# Patient Record
Sex: Male | Born: 1965 | Race: White | Hispanic: No | Smoking: Current some day smoker
Health system: Southern US, Community
[De-identification: ages and names within clinical notes are randomized; demographics above are authoritative.]

## PROBLEM LIST (undated history)

## (undated) DIAGNOSIS — T56891A Toxic effect of other metals, accidental (unintentional), initial encounter: Secondary | ICD-10-CM

## (undated) DIAGNOSIS — F329 Major depressive disorder, single episode, unspecified: Secondary | ICD-10-CM

## (undated) DIAGNOSIS — F319 Bipolar disorder, unspecified: Secondary | ICD-10-CM

## (undated) DIAGNOSIS — F32A Depression, unspecified: Secondary | ICD-10-CM

## (undated) DIAGNOSIS — K625 Hemorrhage of anus and rectum: Secondary | ICD-10-CM

## (undated) DIAGNOSIS — J449 Chronic obstructive pulmonary disease, unspecified: Secondary | ICD-10-CM

## (undated) DIAGNOSIS — G4733 Obstructive sleep apnea (adult) (pediatric): Secondary | ICD-10-CM

## (undated) DIAGNOSIS — I10 Essential (primary) hypertension: Secondary | ICD-10-CM

## (undated) DIAGNOSIS — N529 Male erectile dysfunction, unspecified: Secondary | ICD-10-CM

## (undated) DIAGNOSIS — E785 Hyperlipidemia, unspecified: Secondary | ICD-10-CM

## (undated) DIAGNOSIS — T50901A Poisoning by unspecified drugs, medicaments and biological substances, accidental (unintentional), initial encounter: Secondary | ICD-10-CM

## (undated) DIAGNOSIS — M722 Plantar fascial fibromatosis: Secondary | ICD-10-CM

## (undated) DIAGNOSIS — F1010 Alcohol abuse, uncomplicated: Secondary | ICD-10-CM

## (undated) DIAGNOSIS — R935 Abnormal findings on diagnostic imaging of other abdominal regions, including retroperitoneum: Secondary | ICD-10-CM

## (undated) DIAGNOSIS — K922 Gastrointestinal hemorrhage, unspecified: Secondary | ICD-10-CM

## (undated) DIAGNOSIS — Z9889 Other specified postprocedural states: Secondary | ICD-10-CM

## (undated) HISTORY — DX: Gastrointestinal hemorrhage, unspecified: K92.2

## (undated) HISTORY — DX: Plantar fascial fibromatosis: M72.2

## (undated) HISTORY — DX: Male erectile dysfunction, unspecified: N52.9

## (undated) HISTORY — DX: Hemorrhage of anus and rectum: K62.5

## (undated) HISTORY — DX: Alcohol abuse, uncomplicated: F10.10

## (undated) HISTORY — DX: Toxic effect of other metals, accidental (unintentional), initial encounter: T56.891A

## (undated) HISTORY — DX: Chronic obstructive pulmonary disease, unspecified: J44.9

## (undated) HISTORY — DX: Poisoning by unspecified drugs, medicaments and biological substances, accidental (unintentional), initial encounter: T50.901A

## (undated) HISTORY — DX: Essential (primary) hypertension: I10

## (undated) HISTORY — DX: Hyperlipidemia, unspecified: E78.5

## (undated) HISTORY — DX: Obstructive sleep apnea (adult) (pediatric): G47.33

## (undated) HISTORY — DX: Bipolar disorder, unspecified: F31.9

## (undated) HISTORY — DX: Other specified postprocedural states: Z98.890

## (undated) HISTORY — DX: Abnormal findings on diagnostic imaging of other abdominal regions, including retroperitoneum: R93.5

## (undated) HISTORY — DX: Depression, unspecified: F32.A

## (undated) HISTORY — DX: Major depressive disorder, single episode, unspecified: F32.9

---

## 2004-08-12 ENCOUNTER — Emergency Department: Payer: Self-pay | Admitting: General Practice

## 2004-09-01 ENCOUNTER — Encounter: Payer: Self-pay | Admitting: Unknown Physician Specialty

## 2004-10-17 ENCOUNTER — Ambulatory Visit: Payer: Self-pay | Admitting: Family Medicine

## 2004-10-19 ENCOUNTER — Ambulatory Visit: Payer: Self-pay | Admitting: Family Medicine

## 2004-10-20 ENCOUNTER — Ambulatory Visit: Payer: Self-pay

## 2004-10-20 HISTORY — PX: DOPPLER ECHOCARDIOGRAPHY: SHX263

## 2004-10-26 ENCOUNTER — Ambulatory Visit: Payer: Self-pay | Admitting: Family Medicine

## 2004-11-25 ENCOUNTER — Ambulatory Visit: Payer: Self-pay | Admitting: Family Medicine

## 2005-06-29 ENCOUNTER — Emergency Department: Payer: Self-pay | Admitting: Emergency Medicine

## 2005-11-16 ENCOUNTER — Ambulatory Visit: Payer: Self-pay | Admitting: Family Medicine

## 2005-12-18 ENCOUNTER — Ambulatory Visit: Payer: Self-pay | Admitting: Family Medicine

## 2005-12-27 ENCOUNTER — Ambulatory Visit (HOSPITAL_BASED_OUTPATIENT_CLINIC_OR_DEPARTMENT_OTHER): Admission: RE | Admit: 2005-12-27 | Discharge: 2005-12-27 | Payer: Self-pay | Admitting: Family Medicine

## 2006-01-09 ENCOUNTER — Ambulatory Visit: Payer: Self-pay | Admitting: Pulmonary Disease

## 2006-01-24 ENCOUNTER — Ambulatory Visit: Payer: Self-pay | Admitting: Family Medicine

## 2006-03-20 ENCOUNTER — Ambulatory Visit: Payer: Self-pay | Admitting: Family Medicine

## 2006-04-06 ENCOUNTER — Ambulatory Visit: Payer: Self-pay | Admitting: Family Medicine

## 2006-04-27 ENCOUNTER — Ambulatory Visit: Payer: Self-pay | Admitting: Gastroenterology

## 2006-05-07 ENCOUNTER — Ambulatory Visit: Payer: Self-pay | Admitting: Gastroenterology

## 2006-05-07 ENCOUNTER — Encounter (INDEPENDENT_AMBULATORY_CARE_PROVIDER_SITE_OTHER): Payer: Self-pay | Admitting: *Deleted

## 2006-06-15 ENCOUNTER — Ambulatory Visit: Payer: Self-pay | Admitting: Family Medicine

## 2006-08-29 ENCOUNTER — Ambulatory Visit (HOSPITAL_COMMUNITY): Payer: Self-pay | Admitting: Psychiatry

## 2006-09-18 ENCOUNTER — Ambulatory Visit: Payer: Self-pay | Admitting: Family Medicine

## 2006-09-18 LAB — CONVERTED CEMR LAB
AST: 40 units/L — ABNORMAL HIGH (ref 0–37)
Albumin: 4.1 g/dL (ref 3.5–5.2)
Basophils Absolute: 0.2 10*3/uL — ABNORMAL HIGH (ref 0.0–0.1)
Basophils Relative: 1.4 % — ABNORMAL HIGH (ref 0.0–1.0)
Eosinophils Relative: 3.7 % (ref 0.0–5.0)
GFR calc Af Amer: 85 mL/min
GFR calc non Af Amer: 71 mL/min
Glucose, Bld: 136 mg/dL — ABNORMAL HIGH (ref 70–99)
Lithium Lvl: 0.69 meq/L — ABNORMAL LOW (ref 0.80–1.40)
MCV: 93.5 fL (ref 78.0–100.0)
Monocytes Relative: 5.5 % (ref 3.0–11.0)
Neutro Abs: 8.5 10*3/uL — ABNORMAL HIGH (ref 1.4–7.7)
Platelets: 321 10*3/uL (ref 150–400)
RBC: 4.72 M/uL (ref 4.22–5.81)
RDW: 12.3 % (ref 11.5–14.6)
Sodium: 139 meq/L (ref 135–145)
TSH: 3.84 microintl units/mL (ref 0.35–5.50)

## 2006-09-19 ENCOUNTER — Ambulatory Visit (HOSPITAL_COMMUNITY): Payer: Self-pay | Admitting: Psychiatry

## 2006-10-03 ENCOUNTER — Ambulatory Visit: Payer: Self-pay | Admitting: Family Medicine

## 2006-10-17 ENCOUNTER — Ambulatory Visit (HOSPITAL_COMMUNITY): Payer: Self-pay | Admitting: Psychiatry

## 2006-11-05 ENCOUNTER — Ambulatory Visit: Payer: Self-pay | Admitting: Oncology

## 2006-11-15 LAB — CBC WITH DIFFERENTIAL (CANCER CENTER ONLY)
BASO#: 0.2 10*3/uL (ref 0.0–0.2)
BASO%: 1.2 % (ref 0.0–2.0)
EOS%: 3.5 % (ref 0.0–7.0)
HGB: 14.8 g/dL (ref 13.0–17.1)
LYMPH#: 2.9 10*3/uL (ref 0.9–3.3)
MCHC: 34.6 g/dL (ref 32.0–35.9)
NEUT#: 10.8 10*3/uL — ABNORMAL HIGH (ref 1.5–6.5)
Platelets: 270 10*3/uL (ref 145–400)

## 2006-11-15 LAB — MORPHOLOGY - CHCC SATELLITE

## 2006-11-21 LAB — LEUKOCYTE ALKALINE PHOS: Leukocyte Alkaline  Phos Stain: 93 (ref 22–124)

## 2006-11-21 LAB — COMPREHENSIVE METABOLIC PANEL
Albumin: 4.6 g/dL (ref 3.5–5.2)
Alkaline Phosphatase: 72 U/L (ref 39–117)
CO2: 24 mEq/L (ref 19–32)
Glucose, Bld: 114 mg/dL — ABNORMAL HIGH (ref 70–99)
Potassium: 3.7 mEq/L (ref 3.5–5.3)
Sodium: 138 mEq/L (ref 135–145)
Total Protein: 7.2 g/dL (ref 6.0–8.3)

## 2006-11-21 LAB — FERRITIN: Ferritin: 254 ng/mL (ref 22–322)

## 2006-11-21 LAB — IRON AND TIBC: Iron: 103 ug/dL (ref 42–165)

## 2006-11-21 LAB — LACTATE DEHYDROGENASE: LDH: 153 U/L (ref 94–250)

## 2006-12-03 ENCOUNTER — Ambulatory Visit (HOSPITAL_COMMUNITY): Payer: Self-pay | Admitting: Psychiatry

## 2006-12-04 ENCOUNTER — Telehealth (INDEPENDENT_AMBULATORY_CARE_PROVIDER_SITE_OTHER): Payer: Self-pay | Admitting: *Deleted

## 2006-12-04 ENCOUNTER — Ambulatory Visit: Payer: Self-pay | Admitting: Family Medicine

## 2006-12-04 DIAGNOSIS — M545 Low back pain: Secondary | ICD-10-CM

## 2006-12-12 ENCOUNTER — Ambulatory Visit: Payer: Self-pay | Admitting: Family Medicine

## 2006-12-19 LAB — CBC WITH DIFFERENTIAL (CANCER CENTER ONLY)
BASO#: 0.1 10*3/uL (ref 0.0–0.2)
Eosinophils Absolute: 0.4 10*3/uL (ref 0.0–0.5)
HGB: 13 g/dL (ref 13.0–17.1)
LYMPH#: 2.1 10*3/uL (ref 0.9–3.3)
MCH: 31.9 pg (ref 28.0–33.4)
MONO#: 0.8 10*3/uL (ref 0.1–0.9)
NEUT#: 9 10*3/uL — ABNORMAL HIGH (ref 1.5–6.5)
RBC: 4.07 10*6/uL — ABNORMAL LOW (ref 4.20–5.70)
WBC: 12.3 10*3/uL — ABNORMAL HIGH (ref 4.0–10.0)

## 2006-12-19 LAB — URIC ACID: Uric Acid, Serum: 11.4 mg/dL — ABNORMAL HIGH (ref 2.4–7.0)

## 2006-12-26 ENCOUNTER — Emergency Department: Payer: Self-pay | Admitting: General Practice

## 2006-12-27 ENCOUNTER — Ambulatory Visit: Payer: Self-pay | Admitting: Pulmonary Disease

## 2006-12-28 ENCOUNTER — Inpatient Hospital Stay (HOSPITAL_COMMUNITY): Admission: EM | Admit: 2006-12-28 | Discharge: 2007-01-01 | Payer: Self-pay | Admitting: Emergency Medicine

## 2007-01-01 ENCOUNTER — Ambulatory Visit: Payer: Self-pay | Admitting: Oncology

## 2007-01-01 ENCOUNTER — Ambulatory Visit: Payer: Self-pay | Admitting: Internal Medicine

## 2007-01-01 ENCOUNTER — Encounter: Payer: Self-pay | Admitting: Family Medicine

## 2007-01-09 ENCOUNTER — Ambulatory Visit: Payer: Self-pay | Admitting: Family Medicine

## 2007-01-14 ENCOUNTER — Ambulatory Visit: Payer: Self-pay | Admitting: Internal Medicine

## 2007-01-14 ENCOUNTER — Inpatient Hospital Stay (HOSPITAL_COMMUNITY): Admission: EM | Admit: 2007-01-14 | Discharge: 2007-01-20 | Payer: Self-pay | Admitting: Emergency Medicine

## 2007-01-16 DIAGNOSIS — R935 Abnormal findings on diagnostic imaging of other abdominal regions, including retroperitoneum: Secondary | ICD-10-CM

## 2007-01-16 DIAGNOSIS — Z9889 Other specified postprocedural states: Secondary | ICD-10-CM

## 2007-01-16 HISTORY — DX: Other specified postprocedural states: Z98.890

## 2007-01-16 HISTORY — DX: Abnormal findings on diagnostic imaging of other abdominal regions, including retroperitoneum: R93.5

## 2007-01-18 ENCOUNTER — Ambulatory Visit: Payer: Self-pay | Admitting: Internal Medicine

## 2007-01-24 ENCOUNTER — Ambulatory Visit (HOSPITAL_BASED_OUTPATIENT_CLINIC_OR_DEPARTMENT_OTHER): Admission: RE | Admit: 2007-01-24 | Discharge: 2007-01-24 | Payer: Self-pay | Admitting: Pulmonary Disease

## 2007-01-24 ENCOUNTER — Ambulatory Visit: Payer: Self-pay | Admitting: Pulmonary Disease

## 2007-01-29 ENCOUNTER — Inpatient Hospital Stay (HOSPITAL_COMMUNITY): Admission: RE | Admit: 2007-01-29 | Discharge: 2007-01-31 | Payer: Self-pay | Admitting: *Deleted

## 2007-01-29 ENCOUNTER — Ambulatory Visit: Payer: Self-pay | Admitting: *Deleted

## 2007-01-29 ENCOUNTER — Encounter (INDEPENDENT_AMBULATORY_CARE_PROVIDER_SITE_OTHER): Payer: Self-pay | Admitting: *Deleted

## 2007-01-29 ENCOUNTER — Emergency Department (HOSPITAL_COMMUNITY): Admission: EM | Admit: 2007-01-29 | Discharge: 2007-01-29 | Payer: Self-pay | Admitting: Emergency Medicine

## 2007-02-01 ENCOUNTER — Emergency Department: Payer: Self-pay | Admitting: Emergency Medicine

## 2007-02-02 ENCOUNTER — Emergency Department: Payer: Self-pay | Admitting: Internal Medicine

## 2007-02-03 ENCOUNTER — Emergency Department: Payer: Self-pay | Admitting: Emergency Medicine

## 2007-02-04 ENCOUNTER — Emergency Department: Payer: Self-pay | Admitting: Emergency Medicine

## 2007-02-05 ENCOUNTER — Telehealth: Payer: Self-pay | Admitting: Family Medicine

## 2007-02-06 ENCOUNTER — Ambulatory Visit: Payer: Self-pay | Admitting: Family Medicine

## 2007-02-08 ENCOUNTER — Ambulatory Visit: Payer: Self-pay | Admitting: Orthopaedic Surgery

## 2007-02-13 ENCOUNTER — Encounter: Payer: Self-pay | Admitting: Family Medicine

## 2007-02-13 DIAGNOSIS — I10 Essential (primary) hypertension: Secondary | ICD-10-CM

## 2007-02-13 DIAGNOSIS — D7289 Other specified disorders of white blood cells: Secondary | ICD-10-CM

## 2007-02-13 DIAGNOSIS — E669 Obesity, unspecified: Secondary | ICD-10-CM | POA: Insufficient documentation

## 2007-02-13 DIAGNOSIS — E119 Type 2 diabetes mellitus without complications: Secondary | ICD-10-CM

## 2007-02-14 ENCOUNTER — Encounter (INDEPENDENT_AMBULATORY_CARE_PROVIDER_SITE_OTHER): Payer: Self-pay | Admitting: *Deleted

## 2007-03-02 ENCOUNTER — Emergency Department: Payer: Self-pay

## 2007-03-06 ENCOUNTER — Ambulatory Visit (HOSPITAL_COMMUNITY): Payer: Self-pay | Admitting: Psychiatry

## 2007-04-08 ENCOUNTER — Ambulatory Visit (HOSPITAL_COMMUNITY): Payer: Self-pay | Admitting: Psychiatry

## 2007-04-15 ENCOUNTER — Encounter: Admission: RE | Admit: 2007-04-15 | Discharge: 2007-04-15 | Payer: Self-pay | Admitting: Specialist

## 2007-05-02 ENCOUNTER — Ambulatory Visit: Payer: Self-pay | Admitting: Pulmonary Disease

## 2007-05-09 ENCOUNTER — Encounter (INDEPENDENT_AMBULATORY_CARE_PROVIDER_SITE_OTHER): Payer: Self-pay | Admitting: *Deleted

## 2007-05-10 ENCOUNTER — Ambulatory Visit: Payer: Self-pay | Admitting: Family Medicine

## 2007-05-12 LAB — CONVERTED CEMR LAB
Alkaline Phosphatase: 100 units/L (ref 39–117)
Basophils Absolute: 0.1 10*3/uL (ref 0.0–0.1)
Basophils Relative: 0.7 % (ref 0.0–1.0)
CO2: 29 meq/L (ref 19–32)
Chloride: 103 meq/L (ref 96–112)
Direct LDL: 110.2 mg/dL
GFR calc Af Amer: 85 mL/min
GFR calc non Af Amer: 70 mL/min
Hemoglobin: 14.6 g/dL (ref 13.0–17.0)
Hgb A1c MFr Bld: 7.5 % — ABNORMAL HIGH (ref 4.6–6.0)
Lymphocytes Relative: 19 % (ref 12.0–46.0)
Microalb Creat Ratio: 360.3 mg/g — ABNORMAL HIGH (ref 0.0–30.0)
Microalb, Ur: 25.4 mg/dL — ABNORMAL HIGH (ref 0.0–1.9)
Monocytes Relative: 5.4 % (ref 3.0–11.0)
Neutro Abs: 7.7 10*3/uL (ref 1.4–7.7)
Neutrophils Relative %: 71.8 % (ref 43.0–77.0)
Platelets: 327 10*3/uL (ref 150–400)
RBC: 4.86 M/uL (ref 4.22–5.81)
TSH: 1.54 microintl units/mL (ref 0.35–5.50)
Total Protein: 7.6 g/dL (ref 6.0–8.3)
Triglycerides: 518 mg/dL (ref 0–149)
VLDL: 104 mg/dL — ABNORMAL HIGH (ref 0–40)

## 2007-05-13 ENCOUNTER — Ambulatory Visit: Payer: Self-pay | Admitting: Family Medicine

## 2007-05-13 DIAGNOSIS — G4733 Obstructive sleep apnea (adult) (pediatric): Secondary | ICD-10-CM | POA: Insufficient documentation

## 2007-05-28 ENCOUNTER — Inpatient Hospital Stay (HOSPITAL_COMMUNITY): Admission: RE | Admit: 2007-05-28 | Discharge: 2007-06-03 | Payer: Self-pay | Admitting: Specialist

## 2007-05-28 ENCOUNTER — Ambulatory Visit: Payer: Self-pay | Admitting: Internal Medicine

## 2007-05-28 HISTORY — PX: BACK SURGERY: SHX140

## 2007-05-29 ENCOUNTER — Ambulatory Visit: Payer: Self-pay | Admitting: Physical Medicine & Rehabilitation

## 2007-05-30 ENCOUNTER — Encounter: Payer: Self-pay | Admitting: Family Medicine

## 2007-06-03 ENCOUNTER — Encounter: Payer: Self-pay | Admitting: Family Medicine

## 2007-06-13 ENCOUNTER — Telehealth (INDEPENDENT_AMBULATORY_CARE_PROVIDER_SITE_OTHER): Payer: Self-pay | Admitting: *Deleted

## 2007-06-20 ENCOUNTER — Ambulatory Visit: Payer: Self-pay | Admitting: Family Medicine

## 2007-07-11 ENCOUNTER — Encounter (INDEPENDENT_AMBULATORY_CARE_PROVIDER_SITE_OTHER): Payer: Self-pay | Admitting: *Deleted

## 2007-07-17 ENCOUNTER — Ambulatory Visit (HOSPITAL_COMMUNITY): Payer: Self-pay | Admitting: Psychiatry

## 2007-08-05 ENCOUNTER — Ambulatory Visit: Payer: Self-pay | Admitting: Family Medicine

## 2007-08-05 LAB — CONVERTED CEMR LAB
CO2: 23 meq/L (ref 19–32)
Calcium: 10 mg/dL (ref 8.4–10.5)
GFR calc non Af Amer: 59 mL/min
Potassium: 4.1 meq/L (ref 3.5–5.1)
Sodium: 136 meq/L (ref 135–145)

## 2007-08-12 ENCOUNTER — Ambulatory Visit: Payer: Self-pay | Admitting: Family Medicine

## 2007-08-12 DIAGNOSIS — M2550 Pain in unspecified joint: Secondary | ICD-10-CM

## 2007-08-12 DIAGNOSIS — J069 Acute upper respiratory infection, unspecified: Secondary | ICD-10-CM | POA: Insufficient documentation

## 2007-09-16 ENCOUNTER — Ambulatory Visit (HOSPITAL_COMMUNITY): Payer: Self-pay | Admitting: Psychiatry

## 2007-09-19 ENCOUNTER — Ambulatory Visit: Payer: Self-pay | Admitting: Family Medicine

## 2007-09-19 LAB — CONVERTED CEMR LAB
Glucose, Bld: 113 mg/dL — ABNORMAL HIGH (ref 70–99)
Sodium: 136 meq/L (ref 135–145)

## 2007-09-23 ENCOUNTER — Ambulatory Visit: Payer: Self-pay | Admitting: Family Medicine

## 2007-11-25 ENCOUNTER — Ambulatory Visit (HOSPITAL_COMMUNITY): Payer: Self-pay | Admitting: Psychiatry

## 2007-11-28 ENCOUNTER — Telehealth: Payer: Self-pay | Admitting: Family Medicine

## 2007-11-29 ENCOUNTER — Ambulatory Visit: Payer: Self-pay | Admitting: Family Medicine

## 2007-12-02 ENCOUNTER — Telehealth: Payer: Self-pay | Admitting: Family Medicine

## 2007-12-18 ENCOUNTER — Ambulatory Visit (HOSPITAL_COMMUNITY): Payer: Self-pay | Admitting: Psychiatry

## 2007-12-19 ENCOUNTER — Encounter: Payer: Self-pay | Admitting: Family Medicine

## 2008-01-07 ENCOUNTER — Ambulatory Visit (HOSPITAL_COMMUNITY): Payer: Self-pay | Admitting: Marriage and Family Therapist

## 2008-01-17 ENCOUNTER — Ambulatory Visit (HOSPITAL_COMMUNITY): Payer: Self-pay | Admitting: Marriage and Family Therapist

## 2008-01-20 ENCOUNTER — Encounter: Payer: Self-pay | Admitting: Family Medicine

## 2008-01-24 ENCOUNTER — Ambulatory Visit (HOSPITAL_COMMUNITY): Payer: Self-pay | Admitting: Marriage and Family Therapist

## 2008-01-27 ENCOUNTER — Ambulatory Visit (HOSPITAL_COMMUNITY): Payer: Self-pay | Admitting: Psychiatry

## 2008-01-30 ENCOUNTER — Ambulatory Visit (HOSPITAL_COMMUNITY): Payer: Self-pay | Admitting: Marriage and Family Therapist

## 2008-02-07 ENCOUNTER — Ambulatory Visit (HOSPITAL_COMMUNITY): Payer: Self-pay | Admitting: Marriage and Family Therapist

## 2008-02-13 ENCOUNTER — Ambulatory Visit (HOSPITAL_COMMUNITY): Payer: Self-pay | Admitting: Marriage and Family Therapist

## 2008-02-20 ENCOUNTER — Ambulatory Visit (HOSPITAL_COMMUNITY): Payer: Self-pay | Admitting: Marriage and Family Therapist

## 2008-02-27 ENCOUNTER — Ambulatory Visit (HOSPITAL_COMMUNITY): Payer: Self-pay | Admitting: Marriage and Family Therapist

## 2008-03-13 ENCOUNTER — Ambulatory Visit (HOSPITAL_COMMUNITY): Payer: Self-pay | Admitting: Marriage and Family Therapist

## 2008-03-13 ENCOUNTER — Encounter: Payer: Self-pay | Admitting: Family Medicine

## 2008-03-17 ENCOUNTER — Ambulatory Visit: Payer: Self-pay | Admitting: Family Medicine

## 2008-03-23 ENCOUNTER — Ambulatory Visit (HOSPITAL_COMMUNITY): Payer: Self-pay | Admitting: Psychiatry

## 2008-03-24 ENCOUNTER — Telehealth: Payer: Self-pay | Admitting: Pulmonary Disease

## 2008-03-24 ENCOUNTER — Encounter: Payer: Self-pay | Admitting: Family Medicine

## 2008-04-01 ENCOUNTER — Ambulatory Visit (HOSPITAL_COMMUNITY): Payer: Self-pay | Admitting: Marriage and Family Therapist

## 2008-04-10 ENCOUNTER — Encounter: Payer: Self-pay | Admitting: Pulmonary Disease

## 2008-04-15 ENCOUNTER — Ambulatory Visit (HOSPITAL_COMMUNITY): Payer: Self-pay | Admitting: Marriage and Family Therapist

## 2008-04-21 ENCOUNTER — Encounter: Payer: Self-pay | Admitting: Family Medicine

## 2008-04-24 ENCOUNTER — Ambulatory Visit: Payer: Self-pay | Admitting: Family Medicine

## 2008-04-27 LAB — CONVERTED CEMR LAB
Albumin: 4 g/dL (ref 3.5–5.2)
Alkaline Phosphatase: 81 units/L (ref 39–117)
BUN: 20 mg/dL (ref 6–23)
Basophils Absolute: 0 10*3/uL (ref 0.0–0.1)
Bilirubin, Direct: 0.1 mg/dL (ref 0.0–0.3)
CO2: 24 meq/L (ref 19–32)
Calcium: 9.8 mg/dL (ref 8.4–10.5)
Chloride: 106 meq/L (ref 96–112)
Eosinophils Absolute: 0.3 10*3/uL (ref 0.0–0.7)
Eosinophils Relative: 2.6 % (ref 0.0–5.0)
GFR calc Af Amer: 85 mL/min
HCT: 37.2 % — ABNORMAL LOW (ref 39.0–52.0)
Hemoglobin: 13.2 g/dL (ref 13.0–17.0)
MCHC: 35.6 g/dL (ref 30.0–36.0)
MCV: 92.7 fL (ref 78.0–100.0)
Neutro Abs: 9.6 10*3/uL — ABNORMAL HIGH (ref 1.4–7.7)
Neutrophils Relative %: 75.2 % (ref 43.0–77.0)
RDW: 12.9 % (ref 11.5–14.6)
Total Bilirubin: 0.7 mg/dL (ref 0.3–1.2)
Total Protein: 7.5 g/dL (ref 6.0–8.3)
WBC: 12.7 10*3/uL — ABNORMAL HIGH (ref 4.5–10.5)

## 2008-04-28 ENCOUNTER — Ambulatory Visit (HOSPITAL_COMMUNITY): Payer: Self-pay | Admitting: Marriage and Family Therapist

## 2008-05-06 ENCOUNTER — Ambulatory Visit (HOSPITAL_COMMUNITY): Payer: Self-pay | Admitting: Marriage and Family Therapist

## 2008-05-13 ENCOUNTER — Ambulatory Visit (HOSPITAL_COMMUNITY): Payer: Self-pay | Admitting: Marriage and Family Therapist

## 2008-05-18 ENCOUNTER — Ambulatory Visit (HOSPITAL_COMMUNITY): Payer: Self-pay | Admitting: Marriage and Family Therapist

## 2008-05-22 ENCOUNTER — Ambulatory Visit (HOSPITAL_COMMUNITY): Payer: Self-pay | Admitting: Psychiatry

## 2008-05-26 ENCOUNTER — Ambulatory Visit: Payer: Self-pay | Admitting: Family Medicine

## 2008-06-01 ENCOUNTER — Ambulatory Visit (HOSPITAL_COMMUNITY): Payer: Self-pay | Admitting: Marriage and Family Therapist

## 2008-06-08 ENCOUNTER — Ambulatory Visit (HOSPITAL_COMMUNITY): Payer: Self-pay | Admitting: Marriage and Family Therapist

## 2008-06-08 ENCOUNTER — Ambulatory Visit: Payer: Self-pay | Admitting: Family Medicine

## 2008-06-09 LAB — CONVERTED CEMR LAB
ALT: 97 units/L — ABNORMAL HIGH (ref 0–53)
Albumin: 4.1 g/dL (ref 3.5–5.2)
Alkaline Phosphatase: 73 units/L (ref 39–117)
BUN: 27 mg/dL — ABNORMAL HIGH (ref 6–23)
Calcium: 9.3 mg/dL (ref 8.4–10.5)
Chloride: 103 meq/L (ref 96–112)
Creatinine, Ser: 1.4 mg/dL (ref 0.4–1.5)
Eosinophils Absolute: 0.4 10*3/uL (ref 0.0–0.7)
Eosinophils Relative: 3.8 % (ref 0.0–5.0)
GFR calc Af Amer: 71 mL/min
Glucose, Bld: 167 mg/dL — ABNORMAL HIGH (ref 70–99)
HDL: 34.7 mg/dL — ABNORMAL LOW (ref >39.0)
Microalb Creat Ratio: 71.7 mg/g — ABNORMAL HIGH (ref 0.0–30.0)
Monocytes Absolute: 0.3 10*3/uL (ref 0.1–1.0)
Platelets: 240 10*3/uL (ref 150–400)
RBC: 4.08 M/uL — ABNORMAL LOW (ref 4.22–5.81)
RDW: 13.2 % (ref 11.5–14.6)
Sodium: 137 meq/L (ref 135–145)
TSH: 2.89 microintl units/mL (ref 0.35–5.50)
Total Bilirubin: 0.6 mg/dL (ref 0.3–1.2)
Total CHOL/HDL Ratio: 4.6
Triglycerides: 517 mg/dL (ref 0–149)
VLDL: 103 mg/dL — ABNORMAL HIGH (ref 0–40)
WBC: 10.3 10*3/uL (ref 4.5–10.5)

## 2008-06-10 IMAGING — NM NM GI BLOOD LOSS
2 series · 12 of 12 positions shown · non-contrast
Comparison: None.

CLINICAL DATA: Lower GI bleed.
 NUCLEAR MEDICINE GASTROINTESTINAL BLEEDING SCAN:
TECHNIQUE: Sequential abdominal images [DATE] hours were obtained following intravenous administration of Fc-99m labeled red blood cells.
 Radiopharmaceutical:  25.2 mCi Fc-99m in-vitro labeled red cells.

[Series 1: gi gi bleed · 4.75mm/px · 6 of 60 frames shown (1 of 2)]
[frame 6/60]
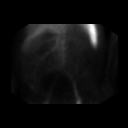
[frame 16/60]
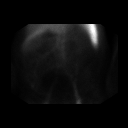
[frame 26/60]
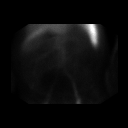
[frame 36/60]
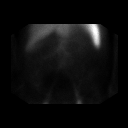
[frame 46/60]
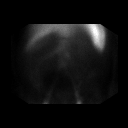
[frame 56/60]
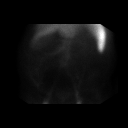

[Series 1: gi gi bleed · 4.75mm/px · 6 of 60 frames shown (2 of 2)]
[frame 6/60]
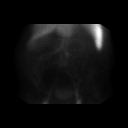
[frame 16/60]
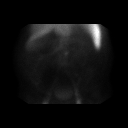
[frame 26/60]
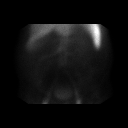
[frame 36/60]
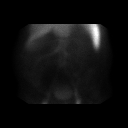
[frame 46/60]
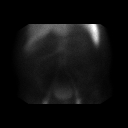
[frame 56/60]
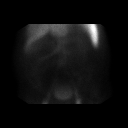

[12 of 12 positions shown; findings below may reference images not displayed]

FINDINGS: There is no definite extravasation of isotope into the large or small bowel.  However, there is a prominent vascular structure extending from the midline down to the right lower quadrant.  It is present throughout the entire study.  Its significance is not known with certainty, but it may represent a prominent ileocecal artery or mesenteric vein.  This raises the question as to whether the patient might have an AVM or telangiectasias in the cecum, that could possibly be the cause of a GI bleed.  As mentioned, at this time there is no visible extravasation into the GI tract.
IMPRESSION: 1.  No definite extravasation into the GI tract. 
 2.  Abnormal blood vessel emanating from the center of the epigastrium to the right lower quadrant ? question prominent ileocecal artery or draining vein.  Question AVM or telangiectasias in the cecum.  See report.

## 2008-06-15 ENCOUNTER — Ambulatory Visit (HOSPITAL_COMMUNITY): Payer: Self-pay | Admitting: Marriage and Family Therapist

## 2008-06-15 ENCOUNTER — Encounter: Payer: Self-pay | Admitting: Family Medicine

## 2008-06-16 ENCOUNTER — Ambulatory Visit: Payer: Self-pay | Admitting: Family Medicine

## 2008-06-22 ENCOUNTER — Ambulatory Visit (HOSPITAL_COMMUNITY): Payer: Self-pay | Admitting: Marriage and Family Therapist

## 2008-08-06 ENCOUNTER — Telehealth: Payer: Self-pay | Admitting: Pulmonary Disease

## 2008-08-11 ENCOUNTER — Ambulatory Visit (HOSPITAL_COMMUNITY): Payer: Self-pay | Admitting: Marriage and Family Therapist

## 2008-08-19 ENCOUNTER — Ambulatory Visit (HOSPITAL_COMMUNITY): Payer: Self-pay | Admitting: Marriage and Family Therapist

## 2008-08-24 ENCOUNTER — Ambulatory Visit (HOSPITAL_COMMUNITY): Payer: Self-pay | Admitting: Psychiatry

## 2008-08-26 ENCOUNTER — Ambulatory Visit: Payer: Self-pay | Admitting: Critical Care Medicine

## 2008-08-31 ENCOUNTER — Telehealth (INDEPENDENT_AMBULATORY_CARE_PROVIDER_SITE_OTHER): Payer: Self-pay | Admitting: *Deleted

## 2008-09-01 ENCOUNTER — Telehealth (INDEPENDENT_AMBULATORY_CARE_PROVIDER_SITE_OTHER): Payer: Self-pay | Admitting: *Deleted

## 2008-09-03 ENCOUNTER — Ambulatory Visit: Payer: Self-pay | Admitting: Family Medicine

## 2008-09-03 LAB — CONVERTED CEMR LAB
AST: 84 units/L — ABNORMAL HIGH (ref 0–37)
Alkaline Phosphatase: 90 units/L (ref 39–117)
BUN: 15 mg/dL (ref 6–23)
Bilirubin, Direct: 0.1 mg/dL (ref 0.0–0.3)
Calcium: 10 mg/dL (ref 8.4–10.5)
Creatinine, Ser: 1.4 mg/dL (ref 0.4–1.5)
GFR calc Af Amer: 71 mL/min
Glucose, Bld: 151 mg/dL — ABNORMAL HIGH (ref 70–99)
Sodium: 136 meq/L (ref 135–145)
Total Bilirubin: 0.6 mg/dL (ref 0.3–1.2)
Total Protein: 7.4 g/dL (ref 6.0–8.3)

## 2008-09-08 ENCOUNTER — Ambulatory Visit (HOSPITAL_COMMUNITY): Payer: Self-pay | Admitting: Marriage and Family Therapist

## 2008-09-10 ENCOUNTER — Encounter: Payer: Self-pay | Admitting: Critical Care Medicine

## 2008-09-14 ENCOUNTER — Ambulatory Visit (HOSPITAL_COMMUNITY): Payer: Self-pay | Admitting: Psychiatry

## 2008-09-22 ENCOUNTER — Ambulatory Visit (HOSPITAL_COMMUNITY): Payer: Self-pay | Admitting: Marriage and Family Therapist

## 2008-10-06 ENCOUNTER — Encounter: Payer: Self-pay | Admitting: Critical Care Medicine

## 2008-10-06 ENCOUNTER — Ambulatory Visit (HOSPITAL_COMMUNITY): Payer: Self-pay | Admitting: Marriage and Family Therapist

## 2008-10-12 ENCOUNTER — Ambulatory Visit (HOSPITAL_COMMUNITY): Payer: Self-pay | Admitting: Psychiatry

## 2008-10-19 ENCOUNTER — Ambulatory Visit (HOSPITAL_COMMUNITY): Payer: Self-pay | Admitting: Marriage and Family Therapist

## 2008-10-20 IMAGING — RF DG LUMBAR SPINE 2-3V
1 series · 3 of 3 positions shown · non-contrast
Comparison: CT myelogram dated 04/15/2007.

CLINICAL DATA: L4 through S1 fusion for degenerative disc disease and
spondylolisthesis.

LUMBAR SPINE - 5 C-ARM VIEW

[Series 0: selected · 3 of 3 slices shown]
[im 1/3]
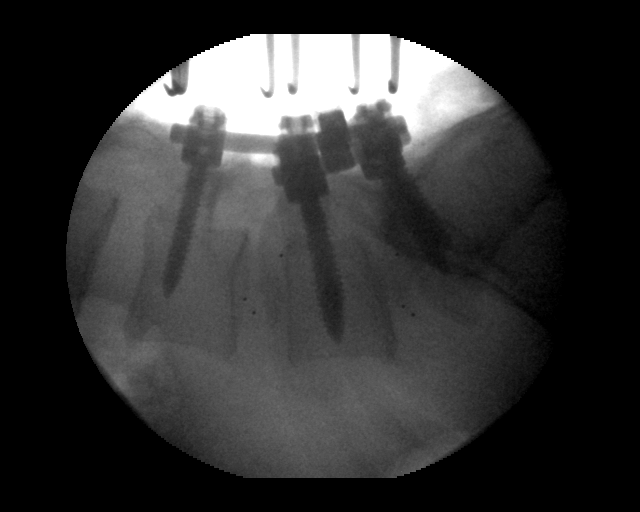
[im 2/3]
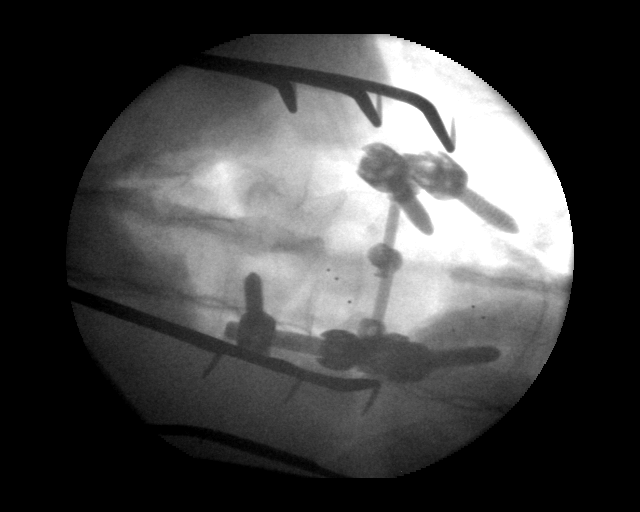
[im 3/3]
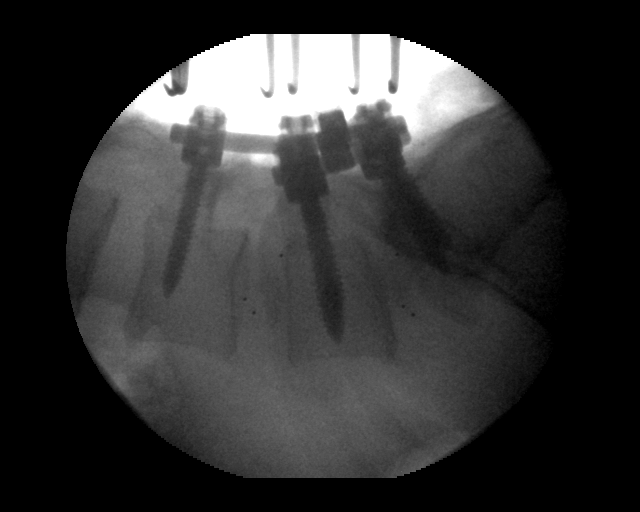

[3 of 3 positions shown; findings below may reference images not displayed]

FINDINGS: Operative images demonstrating interbody bone plug and pedicle screw
and rod placement at the L4 through S1 levels. Normal alignment.

IMPRESSION

Fusion at the L4 through S1 levels with normal alignment.

## 2008-10-20 IMAGING — CR DG CHEST 1V PORT
1 series · 1 of 1 positions shown · non-contrast
Comparison: 05/02/2007

CLINICAL DATA: Status post lumbar spine surgery

CHEST - 1 VIEW:

[view not recorded]
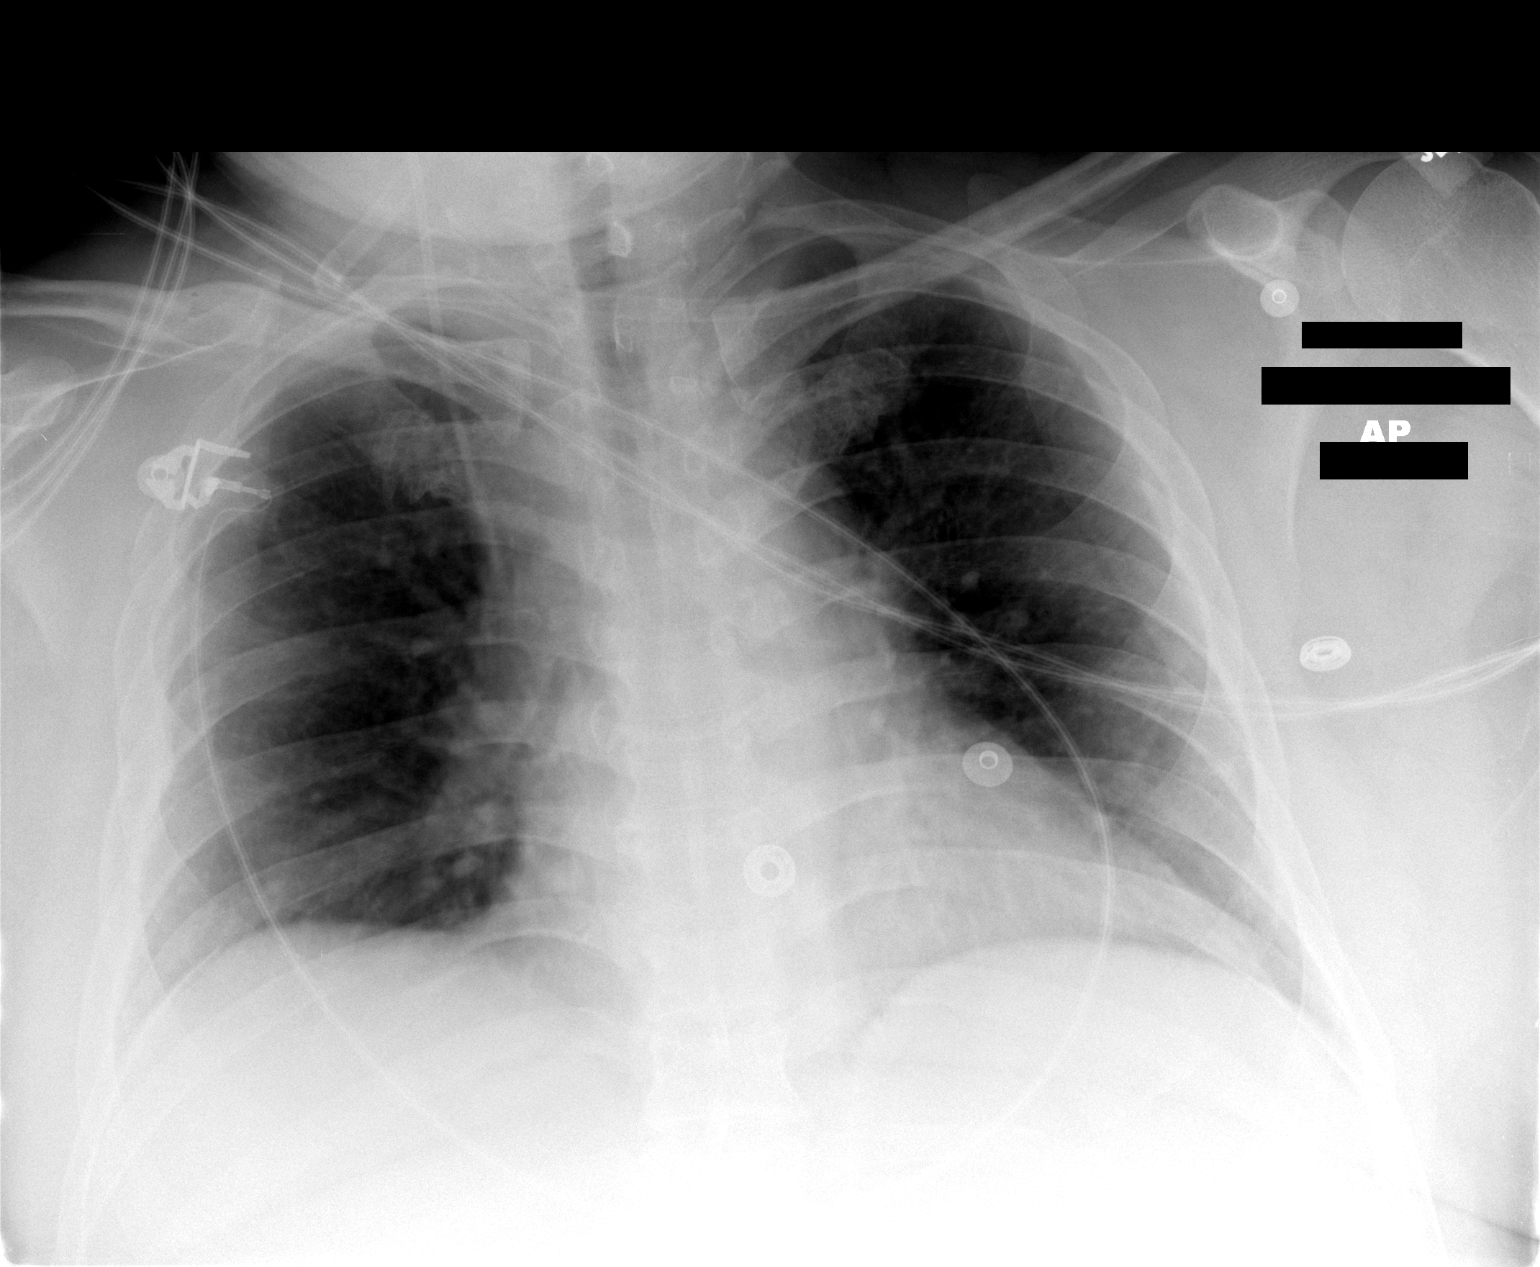

[1 of 1 positions shown; findings below may reference images not displayed]

FINDINGS: Right IJ catheter tip is in the SVC.

The heart size is enlarged. No pulmonary edema or pleural effusions.

The mediastinal contours are within normal limits.

Both lungs are clear.  

The visualized skeletal structures are unremarkable.
IMPRESSION: 1. Cardiac enlargement without heart failure.
2. Tip of IJ catheter in SVC.

## 2008-11-16 ENCOUNTER — Ambulatory Visit (HOSPITAL_COMMUNITY): Payer: Self-pay | Admitting: Marriage and Family Therapist

## 2008-12-07 ENCOUNTER — Ambulatory Visit (HOSPITAL_COMMUNITY): Payer: Self-pay | Admitting: Marriage and Family Therapist

## 2008-12-11 ENCOUNTER — Ambulatory Visit (HOSPITAL_COMMUNITY): Payer: Self-pay | Admitting: Psychiatry

## 2008-12-29 ENCOUNTER — Ambulatory Visit (HOSPITAL_COMMUNITY): Payer: Self-pay | Admitting: Marriage and Family Therapist

## 2009-02-10 ENCOUNTER — Ambulatory Visit (HOSPITAL_COMMUNITY): Payer: Self-pay | Admitting: Psychiatry

## 2009-02-15 ENCOUNTER — Ambulatory Visit (HOSPITAL_COMMUNITY): Payer: Self-pay | Admitting: Marriage and Family Therapist

## 2009-03-10 ENCOUNTER — Ambulatory Visit: Payer: Self-pay | Admitting: Family Medicine

## 2009-03-22 ENCOUNTER — Ambulatory Visit: Payer: Self-pay | Admitting: Family Medicine

## 2009-03-24 ENCOUNTER — Ambulatory Visit: Payer: Self-pay | Admitting: Family Medicine

## 2009-03-24 DIAGNOSIS — R740 Nonspecific elevation of levels of transaminase and lactic acid dehydrogenase [LDH]: Secondary | ICD-10-CM

## 2009-03-24 DIAGNOSIS — R7402 Elevation of levels of lactic acid dehydrogenase (LDH): Secondary | ICD-10-CM | POA: Insufficient documentation

## 2009-03-31 ENCOUNTER — Ambulatory Visit (HOSPITAL_COMMUNITY): Payer: Self-pay | Admitting: Licensed Clinical Social Worker

## 2009-04-14 ENCOUNTER — Ambulatory Visit (HOSPITAL_COMMUNITY): Payer: Self-pay | Admitting: Psychiatry

## 2009-04-19 ENCOUNTER — Ambulatory Visit (HOSPITAL_COMMUNITY): Payer: Self-pay | Admitting: Marriage and Family Therapist

## 2009-04-21 ENCOUNTER — Ambulatory Visit: Payer: Self-pay | Admitting: Psychiatry

## 2009-04-21 ENCOUNTER — Inpatient Hospital Stay (HOSPITAL_COMMUNITY): Admission: AD | Admit: 2009-04-21 | Discharge: 2009-04-22 | Payer: Self-pay | Admitting: Psychiatry

## 2009-05-05 ENCOUNTER — Ambulatory Visit (HOSPITAL_COMMUNITY): Payer: Self-pay | Admitting: Marriage and Family Therapist

## 2009-05-11 ENCOUNTER — Ambulatory Visit (HOSPITAL_COMMUNITY): Payer: Self-pay | Admitting: Marriage and Family Therapist

## 2009-05-12 ENCOUNTER — Ambulatory Visit (HOSPITAL_COMMUNITY): Payer: Self-pay | Admitting: Psychiatry

## 2009-05-26 ENCOUNTER — Ambulatory Visit (HOSPITAL_COMMUNITY): Payer: Self-pay | Admitting: Marriage and Family Therapist

## 2009-06-01 ENCOUNTER — Ambulatory Visit (HOSPITAL_COMMUNITY): Payer: Self-pay | Admitting: Marriage and Family Therapist

## 2009-06-03 ENCOUNTER — Telehealth: Payer: Self-pay | Admitting: Family Medicine

## 2009-06-08 ENCOUNTER — Ambulatory Visit (HOSPITAL_COMMUNITY): Payer: Self-pay | Admitting: Marriage and Family Therapist

## 2009-06-11 ENCOUNTER — Ambulatory Visit (HOSPITAL_COMMUNITY): Payer: Self-pay | Admitting: Psychiatry

## 2009-06-21 ENCOUNTER — Telehealth: Payer: Self-pay | Admitting: Family Medicine

## 2009-06-21 ENCOUNTER — Ambulatory Visit: Payer: Self-pay | Admitting: Family Medicine

## 2009-06-21 LAB — CONVERTED CEMR LAB: Hgb A1c MFr Bld: 12.5 % — ABNORMAL HIGH (ref 4.6–6.5)

## 2009-06-28 ENCOUNTER — Ambulatory Visit: Payer: Self-pay | Admitting: Family Medicine

## 2009-07-01 ENCOUNTER — Ambulatory Visit (HOSPITAL_COMMUNITY): Payer: Self-pay | Admitting: Marriage and Family Therapist

## 2009-07-06 ENCOUNTER — Ambulatory Visit (HOSPITAL_COMMUNITY): Payer: Self-pay | Admitting: Marriage and Family Therapist

## 2009-07-13 ENCOUNTER — Ambulatory Visit (HOSPITAL_COMMUNITY): Payer: Self-pay | Admitting: Marriage and Family Therapist

## 2009-07-16 ENCOUNTER — Ambulatory Visit (HOSPITAL_COMMUNITY): Payer: Self-pay | Admitting: Psychiatry

## 2009-07-27 ENCOUNTER — Telehealth: Payer: Self-pay | Admitting: Family Medicine

## 2009-08-19 ENCOUNTER — Ambulatory Visit (HOSPITAL_COMMUNITY): Payer: Self-pay | Admitting: Marriage and Family Therapist

## 2009-08-20 ENCOUNTER — Telehealth: Payer: Self-pay | Admitting: Family Medicine

## 2009-08-26 ENCOUNTER — Ambulatory Visit: Payer: Self-pay | Admitting: Family Medicine

## 2009-09-09 ENCOUNTER — Ambulatory Visit (HOSPITAL_COMMUNITY): Payer: Self-pay | Admitting: Marriage and Family Therapist

## 2009-09-10 ENCOUNTER — Ambulatory Visit (HOSPITAL_COMMUNITY): Payer: Self-pay | Admitting: Psychiatry

## 2009-09-27 ENCOUNTER — Ambulatory Visit: Payer: Self-pay | Admitting: Family Medicine

## 2009-09-28 LAB — CONVERTED CEMR LAB: Vit D, 25-Hydroxy: 17 ng/mL — ABNORMAL LOW (ref 30–89)

## 2009-10-11 ENCOUNTER — Ambulatory Visit (HOSPITAL_COMMUNITY): Payer: Self-pay | Admitting: Marriage and Family Therapist

## 2009-10-20 ENCOUNTER — Ambulatory Visit (HOSPITAL_COMMUNITY): Payer: Self-pay | Admitting: Psychiatry

## 2009-10-26 ENCOUNTER — Ambulatory Visit (HOSPITAL_COMMUNITY): Payer: Self-pay | Admitting: Marriage and Family Therapist

## 2009-11-17 ENCOUNTER — Ambulatory Visit (HOSPITAL_COMMUNITY): Payer: Self-pay | Admitting: Marriage and Family Therapist

## 2009-12-20 ENCOUNTER — Ambulatory Visit (HOSPITAL_COMMUNITY): Payer: Self-pay | Admitting: Psychiatry

## 2009-12-24 ENCOUNTER — Telehealth: Payer: Self-pay | Admitting: Family Medicine

## 2010-03-07 ENCOUNTER — Ambulatory Visit (HOSPITAL_COMMUNITY): Payer: Self-pay | Admitting: Psychiatry

## 2010-03-08 ENCOUNTER — Encounter (INDEPENDENT_AMBULATORY_CARE_PROVIDER_SITE_OTHER): Payer: Self-pay | Admitting: *Deleted

## 2010-03-22 ENCOUNTER — Ambulatory Visit (HOSPITAL_COMMUNITY): Payer: Self-pay | Admitting: Marriage and Family Therapist

## 2010-04-05 ENCOUNTER — Ambulatory Visit (HOSPITAL_COMMUNITY): Payer: Self-pay | Admitting: Marriage and Family Therapist

## 2010-04-20 ENCOUNTER — Ambulatory Visit: Payer: Self-pay | Admitting: Family Medicine

## 2010-04-20 LAB — CONVERTED CEMR LAB
ALT: 37 units/L (ref 0–53)
Albumin: 4.4 g/dL (ref 3.5–5.2)
Alkaline Phosphatase: 88 units/L (ref 39–117)
BUN: 13 mg/dL (ref 6–23)
Bilirubin, Direct: 0 mg/dL (ref 0.0–0.3)
Cholesterol: 264 mg/dL — ABNORMAL HIGH (ref 0–200)
Creatinine,U: 18 mg/dL
Direct LDL: 91.9 mg/dL
Glucose, Bld: 177 mg/dL — ABNORMAL HIGH (ref 70–99)
Hgb A1c MFr Bld: 9.3 % — ABNORMAL HIGH (ref 4.6–6.5)
Microalb Creat Ratio: 33.4 mg/g — ABNORMAL HIGH (ref 0.0–30.0)
Microalb, Ur: 6 mg/dL — ABNORMAL HIGH (ref 0.0–1.9)
Potassium: 3.8 meq/L (ref 3.5–5.1)
Triglycerides: 1005 mg/dL — ABNORMAL HIGH (ref 0.0–149.0)

## 2010-05-16 ENCOUNTER — Ambulatory Visit (HOSPITAL_COMMUNITY): Payer: Self-pay | Admitting: Marriage and Family Therapist

## 2010-05-18 ENCOUNTER — Ambulatory Visit (HOSPITAL_COMMUNITY): Payer: Self-pay | Admitting: Psychiatry

## 2010-05-19 ENCOUNTER — Telehealth: Payer: Self-pay | Admitting: Family Medicine

## 2010-05-26 ENCOUNTER — Ambulatory Visit: Payer: Self-pay | Admitting: Family Medicine

## 2010-05-31 ENCOUNTER — Ambulatory Visit (HOSPITAL_COMMUNITY): Payer: Self-pay | Admitting: Marriage and Family Therapist

## 2010-06-14 ENCOUNTER — Ambulatory Visit (HOSPITAL_COMMUNITY): Payer: Self-pay | Admitting: Marriage and Family Therapist

## 2010-06-16 ENCOUNTER — Telehealth: Payer: Self-pay | Admitting: Family Medicine

## 2010-06-28 ENCOUNTER — Ambulatory Visit (HOSPITAL_COMMUNITY): Payer: Self-pay | Admitting: Marriage and Family Therapist

## 2010-06-29 ENCOUNTER — Telehealth: Payer: Self-pay | Admitting: Family Medicine

## 2010-07-08 ENCOUNTER — Ambulatory Visit (HOSPITAL_COMMUNITY): Payer: Self-pay | Admitting: Psychiatry

## 2010-07-14 ENCOUNTER — Ambulatory Visit (HOSPITAL_COMMUNITY): Payer: Self-pay | Admitting: Marriage and Family Therapist

## 2010-07-26 ENCOUNTER — Ambulatory Visit (HOSPITAL_COMMUNITY)
Admission: RE | Admit: 2010-07-26 | Discharge: 2010-07-26 | Payer: Self-pay | Source: Home / Self Care | Attending: Marriage and Family Therapist | Admitting: Marriage and Family Therapist

## 2010-08-03 ENCOUNTER — Ambulatory Visit (HOSPITAL_COMMUNITY)
Admission: RE | Admit: 2010-08-03 | Discharge: 2010-08-03 | Payer: Self-pay | Source: Home / Self Care | Attending: Marriage and Family Therapist | Admitting: Marriage and Family Therapist

## 2010-08-10 ENCOUNTER — Ambulatory Visit: Admit: 2010-08-10 | Payer: Self-pay | Admitting: Family Medicine

## 2010-08-11 ENCOUNTER — Encounter (INDEPENDENT_AMBULATORY_CARE_PROVIDER_SITE_OTHER): Payer: Self-pay | Admitting: *Deleted

## 2010-08-12 ENCOUNTER — Telehealth: Payer: Self-pay | Admitting: Family Medicine

## 2010-08-15 ENCOUNTER — Ambulatory Visit
Admission: RE | Admit: 2010-08-15 | Discharge: 2010-08-15 | Payer: Self-pay | Source: Home / Self Care | Attending: Family Medicine | Admitting: Family Medicine

## 2010-08-15 ENCOUNTER — Other Ambulatory Visit: Payer: Self-pay | Admitting: Family Medicine

## 2010-08-15 LAB — LIPID PANEL
Cholesterol: 282 mg/dL — ABNORMAL HIGH (ref 0–200)
HDL: 32.5 mg/dL — ABNORMAL LOW (ref >39.00)
Total CHOL/HDL Ratio: 9
Triglycerides: 1971 mg/dL — ABNORMAL HIGH (ref 0.0–149.0)
VLDL: 394.2 mg/dL — ABNORMAL HIGH (ref 0.0–40.0)

## 2010-08-15 LAB — LDL CHOLESTEROL, DIRECT: Direct LDL: 75 mg/dL

## 2010-08-15 LAB — HEMOGLOBIN A1C: Hgb A1c MFr Bld: 7.1 % — ABNORMAL HIGH (ref 4.6–6.5)

## 2010-08-16 ENCOUNTER — Ambulatory Visit (HOSPITAL_COMMUNITY)
Admission: RE | Admit: 2010-08-16 | Discharge: 2010-08-16 | Payer: Self-pay | Source: Home / Self Care | Attending: Marriage and Family Therapist | Admitting: Marriage and Family Therapist

## 2010-08-22 ENCOUNTER — Ambulatory Visit
Admission: RE | Admit: 2010-08-22 | Discharge: 2010-08-22 | Payer: Self-pay | Source: Home / Self Care | Attending: Family Medicine | Admitting: Family Medicine

## 2010-08-22 LAB — HM DIABETES FOOT EXAM

## 2010-08-23 ENCOUNTER — Ambulatory Visit (HOSPITAL_COMMUNITY)
Admission: RE | Admit: 2010-08-23 | Discharge: 2010-08-23 | Payer: Self-pay | Source: Home / Self Care | Attending: Marriage and Family Therapist | Admitting: Marriage and Family Therapist

## 2010-08-28 LAB — CONVERTED CEMR LAB
Albumin: 4.2 g/dL (ref 3.5–5.2)
Bilirubin, Direct: 0 mg/dL (ref 0.0–0.3)
Lithium Lvl: 0.94 meq/L (ref 0.80–1.40)
Microalb, Ur: 45.3 mg/dL — ABNORMAL HIGH (ref 0.0–1.9)
Total Protein: 8 g/dL (ref 6.0–8.3)

## 2010-08-30 ENCOUNTER — Ambulatory Visit (HOSPITAL_COMMUNITY)
Admission: RE | Admit: 2010-08-30 | Discharge: 2010-08-30 | Payer: Self-pay | Source: Home / Self Care | Attending: Marriage and Family Therapist | Admitting: Marriage and Family Therapist

## 2010-08-30 NOTE — Letter (Signed)
Summary: Schaller Retirement letter  [facility] [place]  [address]   [place], [place] [address]   Phone: [phone]  Fax: [phone]       03/08/2010 MRN: [id number]  Destiny Newberry [address] [place], [place]  [address]  Dear Mr. Canale,  [facility] [place], and [place] announce the retirement of ROBERT N. SCHALLER, M.D., from full-time practice at the [place] office effective January 27, 2010 and his plans of returning part-time.  It is important to Dr. Schaller and to our practice that you understand that [facility] [place] has seven physicians in our office for your health care needs.  We will continue to offer the same exceptional care that you have today.    Dr. Schaller has spoken to many of you about his plans for retirement and returning part-time in the fall.   We will continue to work with you through the transition to schedule appointments for you in the office and meet the high standards that [place] is committed to.   Again, it is with great pleasure that we share the news that Dr. Schaller will return to [facility] [place] in October of 2011 with a reduced schedule.    If you have any questions, or would like to request an appointment with one of our physicians, please call us at [phone] and press the option for Scheduling an appointment.  We take pleasure in providing you with excellent patient care and look forward to seeing you at your next office visit.  Our [place] Physicians are:  Richard Letvak, M.D. Robert Schaller, M.D. Marne Tower, M.D. Amy Bedsole, M.D. Spencer Copland, M.D. Talia Aron, M.D. We proudly welcomed Shaw Duncan, M.D. and Javier Gutierrez, M.D. to the practice in July/August 2011.  Sincerely,  [facility] [place] 

## 2010-08-30 NOTE — Progress Notes (Signed)
Summary: needs new script for levemir  Phone Note Refill Request   Refills Requested: Medication #1:  LEVEMIR FLEXPEN 100 UNIT/ML SOLN 50 units subcutaneously once a day. Pt states he is now up to 110 units once a day and will need a new script, with increased number of pens, called to [facility] [address]  He said he may need to increase dose, said his blood sugar was at 130 this morning and it should be lower than that.  Initial call taken by: Lowella Petties CMA, AAMA,  June 16, 2010 12:52 PM  Follow-up for Phone Call        please get patient to call back with listing of recent AM glucose readings.  thanks. I went ahead and sent the rx, but please verify the dose (110 units qd). Follow-up by: Crawford Givens MD,  June 16, 2010 1:43 PM  Additional Follow-up for Phone Call Additional follow up Details #1::        [facility] for pt to call.            Lowella Petties CMA, AAMA  June 16, 2010 2:25 PM  noted.  If dose is verified, please sign off on note.  No needs to reply to me on this unless there is a question about dose.  thanks.  Crawford Givens MD  June 16, 2010 2:26 PM     New/Updated Medications: LEVEMIR FLEXPEN 100 UNIT/ML SOLN (INSULIN DETEMIR) 110 units subcutaneously once a day. Prescriptions: LEVEMIR FLEXPEN 100 UNIT/ML SOLN (INSULIN DETEMIR) 110 units subcutaneously once a day.  #3 boxes x 3   Entered and Authorized by:   Crawford Givens MD   Signed by:   Crawford Givens MD on 06/16/2010   Method used:   Electronically to        [facility]. [address] [phone]* (retail)       [address] [place], [place]  [id number]       Ph: [phone]       Fax: [phone]   RxID:   [id number]

## 2010-08-30 NOTE — Assessment & Plan Note (Signed)
Summary: TRANSFER FROM DR SCHALLER/CLE   Vital Signs:  Patient profile:   45 year old male Height:      69 inches Weight:      274.25 pounds BMI:     40.65 Temp:     98.7 degrees F oral Pulse rate:   96 / minute Pulse rhythm:   regular BP sitting:   142 / 88  (left arm) Cuff size:   large  Vitals Entered By: Delilah Shan CMA Duncan Dull) (May 26, 2010 2:07 PM) CC: Transfer from [facility]   History of Present Illness: Diabetes:  Using medications without difficulties:yes Hypoglycemic episodes:no Hyperglycemic episodes:yes Feet problems: h/o neuropathy r:   Hypertension:      Using medication without problems or lightheadedness: yes Chest pain with exertion:no Edema:no Short of breath:no  H/o BAD- Currently lives alone.  Wife moved out.  No SI/HI.  Ran out of psych meds but refills are coming in today or tomorrow.    Allergies: 1)  ! Depakote (Divalproex Sodium)  Past History:  Past Surgical History: Last updated: 06/16/2008                  2 Overdose Episodes in Past                  3 Cortisone shots in heel due to Plantar Fasciitis (Dr Al Corpus) 10/20/04      Echo  nml. 12/27/05      Sleep Study, severe sleep apnea 67 ERMS/HR 05/07/06      Colonoscopy, polyps, biopsy (-) 10/03/06        PFT moderately severe obstruction 5/30 - 01/01/07   [facility]  BRBPR 01/16/07     Capsule endoscopy - bleeding in smal bowel 6/15 - 01/20/07  [facility]  GI bleed, ? NSaids  Anemia 01/16/07      CT Angio Abd. - no acute process    Pelvic CT Pars Defect L5 Mod Disc Bulge L4/5 7/1-02/02/07 [facility] - Lithium toxicity 05/28/07   Back Surgery L4-S1 fusing, pins, screws and rods (Dr Otelia Sergeant)  Past Medical History: COPD Diabetes mellitus, type II Hyperlipidemia Hypertension BAD/schizoaffective d/o ED OSA In therapy at [facility] Sees Dr. Lolly Mustache [facility]  Family History: Reviewed history from 06/16/2008 and no changes required. Father: Alive  Mother: dead 31-Dec-2009 Mastectomy Breast Ca  Bone Ca in  Pelvis and lower Back Brother 76 A, Sleep Apnea Prediabetic mild obesity Sister 37 A Mildly overweight. DM:  (+) MGF CA: MGF (skin) Depression:  (+) self, Aunt bipolar, Mother mild paranoid schizophrenic ETOH:  (+) self  Social History: Reviewed history from 06/16/2008 and no changes required. Current Smoker, 2 PPD Alcohol use-yes, rarely Drug use-no Marital Status:  Married 10/2005, separated as of 12/31/2009 Children: daughter Irving Burton Occupation: Chartered certified accountant, Estate agent, disabilty from back surgery  Review of Systems       See HPI.  Otherwise negative.    Physical Exam  General:  GEN: nad, alert and oriented, obese HEENT: mucous membranes moist NECK: supple w/o LA CV: rrr PULM: ctab, no inc wob ABD: soft, +bs, obese, umbilical hernia noted EXT: no edema SKIN: no acute rash   Diabetes Management Exam:    Foot Exam (with socks and/or shoes not present):       Sensory-Pinprick/Light touch:          Left medial foot (L-4): diminished          Left dorsal foot (L-5): diminished  Left lateral foot (S-1): diminished          Right medial foot (L-4): diminished          Right dorsal foot (L-5): diminished          Right lateral foot (S-1): diminished       Sensory-Monofilament:          Left foot: normal          Right foot: normal       Sensory-other: sensation is better on L foot than R       Inspection:          Left foot: normal          Right foot: normal       Nails:          Left foot: normal          Right foot: normal   Impression & Recommendations:  Problem # 1:  DIABETES MELLITUS, TYPE II (ICD-250.00) 2 sample pens of levemir given along with one touch ultra meter.  patient to call back with readings.  Labs d/w patient.  He was having trouble paying for meds.  At this point, I would get him back on his psych meds (today or tomrrow) and have him check glucose.  He'll call back and we can adjust levemir as needed.  He agrees.  His updated  medication list for this problem includes:    Metformin Hcl 1000 Mg Tabs (Metformin hcl) ..... One tab by mouth two times a day    Lisinopril 10 Mg Tabs (Lisinopril) ..... One tab by mouth at night    Lisinopril-hydrochlorothiazide 10-12.5 Mg Tabs (Lisinopril-hydrochlorothiazide) ..... One tab by mouth every am    Levemir Flexpen 100 Unit/ml Soln (Insulin detemir) .Marland KitchenMarland KitchenMarland KitchenMarland Kitchen 50 units subcutaneously once a day.  Problem # 2:  DISORDER, BIPOLAR NOS (ICD-296.80) As above.   Problem # 3:  HYPERLIPIDEMIA (ICD-272.4) I wouldn't start lipid agent until DM2 is under better control. Pt agrees with plan .  Complete Medication List: 1)  Lithium Carbonate 450 Mg Tbcr (Lithium carbonate) .Marland Kitchen.. 1 tablet three times a day 2)  Metformin Hcl 1000 Mg Tabs (Metformin hcl) .... One tab by mouth two times a day 3)  Lisinopril 10 Mg Tabs (Lisinopril) .... One tab by mouth at night 4)  Lisinopril-hydrochlorothiazide 10-12.5 Mg Tabs (Lisinopril-hydrochlorothiazide) .... One tab by mouth every am 5)  Seroquel 400 Mg Tabs (Quetiapine fumarate) .... Take 1 tablet by mouth at bedtime 6)  Effexor Xr 150 Mg Xr24h-cap (Venlafaxine hcl) .Marland Kitchen.. 1 tablet daily by mouth 7)  Neurontin 600 Mg Tabs (Gabapentin) .Marland Kitchen.. 1 at bedtime 8)  Levemir Flexpen 100 Unit/ml Soln (Insulin detemir) .... 50 units subcutaneously once a day. 9)  Onetouch Ultra Blue Strp (Glucose blood) .... Check blood sugar once daily 10)  Onetouch Lancets Misc (Lancets) .... Check blood sugar once daily  Patient Instructions: 1)  Check your sugar in the AM next few days and call me back with the results.  If you have trouble getting your meds or supplies, let us know.   2)  I want to recheck your lipids and A1c (250.00) in 3-4 months.  Come if fasting for a lab visit and a regular office visit a few days later.    Orders Added: 1)  Est. Patient Level IV [04540]   Pneumonia Vaccine (to be given today)   Pneumonia Vaccine (to be given today)  Current  Allergies (reviewed today): !  DEPAKOTE (DIVALPROEX SODIUM)

## 2010-08-30 NOTE — Letter (Signed)
Summary: Schaller Retirement letter  [facility] [place]  [address]   [place], [place] [address]   Phone: [phone]  Fax: [phone]       03/08/2010 MRN: [id number]  Anil Geralds [address] [place], [place]  [address]  Dear Mr. Klus,  [facility] [place], and [facility] announce the retirement of ROBERT N. SCHALLER, M.D., from full-time practice at the [place] office effective January 27, 2010 and his plans of returning part-time.  It is important to Dr. Schaller and to our practice that you understand that [facility] [place] has seven physicians in our office for your health care needs.  We will continue to offer the same exceptional care that you have today.    Dr. Schaller has spoken to many of you about his plans for retirement and returning part-time in the fall.   We will continue to work with you through the transition to schedule appointments for you in the office and meet the high standards that [facility] is committed to.   Again, it is with great pleasure that we share the news that Dr. Schaller will return to [facility] [place] in October of 2011 with a reduced schedule.    If you have any questions, or would like to request an appointment with one of our physicians, please call us at [phone] and press the option for Scheduling an appointment.  We take pleasure in providing you with excellent patient care and look forward to seeing you at your next office visit.  Our [place] Physicians are:  Richard Letvak, M.D. Robert Schaller, M.D. Marne Tower, M.D. Amy Bedsole, M.D. Spencer Copland, M.D. Talia Aron, M.D. We proudly welcomed Shaw Duncan, M.D. and Javier Gutierrez, M.D. to the practice in July/August 2011.  Sincerely,  [facility] [place] 

## 2010-08-30 NOTE — Progress Notes (Signed)
Summary: Bi-Pap machine mask  Phone Note Call from Patient Call back at Home Phone [phone]   Caller: Patient Call For: Shaune Leeks MD Summary of Call: Patient says he needs a new mask for his bi-pap machine.  [facility] says he will need a Rx. for it.  Can you do that for him or does he need to see Dr. Craige Cotta who originally set him up with the machine? Initial call taken by: Delilah Shan CMA Duncan Dull),  Dec 24, 2009 2:52 PM  Follow-up for Phone Call        Written and in out box. Follow-up by: Shaune Leeks MD,  Dec 28, 2009 7:25 AM  Additional Follow-up for Phone Call Additional follow up Details #1::        Patient notified by telephone that rx is up front and ready for pickup. Additional Follow-up by: Sydell Axon LPN,  Dec 28, 2009 10:15 AM

## 2010-08-30 NOTE — Progress Notes (Signed)
Summary: ? about Splenda  Phone Note Call from Patient Call back at [phone]   Caller: Patient Call For: Shaune Leeks MD Summary of Call: Pt is diabetic and his blood sugars non fasting are running around 215. Pt said this is after he drinks two 20 oz glasses of tea sweetened with Splenda. Pt wonders if Dr. Hetty Ely has a better suggestion for sugar substitute than the Splenda. Pt thinks since Splenda comes from sugar maybe that is keeping his blood sugar elevated. Pt said OK to hear response next week. Please advise.  Initial call taken by: Lewanda Rife LPN,  August 20, 2009 9:34 AM  Follow-up for Phone Call        Splenda is definitely from three sugars and my opinion is not to use it. Sweet and Low and Equal are fine. Follow-up by: Shaune Leeks MD,  August 20, 2009 4:44 PM  Additional Follow-up for Phone Call Additional follow up Details #1::        Patient notified as instructed by telephone. Lewanda Rife LPN  August 20, 2009 4:49 PM

## 2010-08-30 NOTE — Progress Notes (Signed)
Summary: levemir not covered by insurance  Phone Note From Pharmacy   Caller: [employer] [facility] [address] [place] [phone]* Call For: Dr. Para March   Summary of Call: [place] from [employer] [facility] is asking if you couldl presctibe a different insulin for patient. She says that the insurance will not cover the levemir for 3 boxes per month, and it is going to cost the patient over $600 for this. She says that his insurance will cover the novelog flexpin for 3 boxes for a 34 day period and it will only cost the patient $60 out of pocket. Please advise Please call [place] at [phone].   Initial call taken by: Melody Comas,  June 29, 2010 11:32 AM  Follow-up for Phone Call        I called pharmacy and gave verbal order to change to lantus.  meds updated.  Follow-up by: Crawford Givens MD,  June 29, 2010 11:56 AM    New/Updated Medications: LANTUS SOLOSTAR 100 UNIT/ML SOLN (INSULIN GLARGINE) 110 units injected daily Prescriptions: LANTUS SOLOSTAR 100 UNIT/ML SOLN (INSULIN GLARGINE) 110 units injected daily  #3 boxes x 12   Entered and Authorized by:   Crawford Givens MD   Signed by:   Crawford Givens MD on 06/29/2010   Method used:   Historical   RxID:   [id number]

## 2010-08-30 NOTE — Letter (Signed)
Summary: [name] letter  [place]  [address] [place], [place] [address]   Phone: [phone]  Fax: [phone]       03/08/2010 MRN: [id number]  [facility] [address] [place], [place]  [address]  Dear Mr. [name] [place], and [place] announce the retirement of Arta Silence, M.D., from full-time practice at the [facility] office effective January 27, 2010 and his plans of returning part-time.  It is important to Dr. Hetty Ely and to our practice that you understand that [facility] has seven physicians in our office for your health care needs.  We will continue to offer the same exceptional care that you have today.    Dr. Hetty Ely has spoken to many of you about his plans for retirement and returning part-time in the fall.   We will continue to work with you through the transition to schedule appointments for you in the office and meet the high standards that [place] is committed to.   Again, it is with great pleasure that we share the news that Dr. Hetty Ely will return to [facility] in October of 2011 with a reduced schedule.    If you have any questions, or would like to request an appointment with one of our physicians, please call us at [phone] and press the option for Scheduling an appointment.  We take pleasure in providing you with excellent patient care and look forward to seeing you at your next office visit.  Our [facility] Physicians are:  Tillman Abide, M.D. Laurita Quint, M.D. Roxy Manns, M.D. Kerby Nora, M.D. Hannah Beat, M.D. Ruthe Mannan, M.D. We proudly welcomed Raechel Ache, M.D. and Eustaquio Boyden, M.D. to the practice in July/August 2011.  Sincerely,   [facility]

## 2010-08-30 NOTE — Assessment & Plan Note (Signed)
Summary: 1 M F/U DLO   Vital Signs:  Patient profile:   45 year old male Weight:      286 pounds Temp:     98.0 degrees F oral Pulse rate:   88 / minute Pulse rhythm:   regular BP sitting:   124 / 70  (left arm) Cuff size:   large  Vitals Entered By: Sydell Axon LPN (August 26, 2009 11:36 AM) CC: One month follow, has a head cold   History of Present Illness: Pt here for followup. When last seen was real congested and he is doing better with that altho congested today. He takes Mucinex and will suggest different medication. He was also found to be significantly depleted of Vitt D last time and started on replacement weekly. He was also started on Levemir last visit and is gradually feeling better, prob a combo of Vit D and lowering sugars. He has used [place] as an alternate sweetener and we have discussed that. He also drinks a lot of Diet Mtn Dew which uses sucralose....we discussed the facty that that is a sugar. He knows to use alternated sweeteners. Overall he feels much better. His wife is due in Apr and her pregnancy is going well.  Problems Prior to Update: 1)  Nonspec Elevation of Levels of Transaminase/ldh  (ICD-790.4) 2)  Unspecified Vitamin D Deficiency  (ICD-268.9) 3)  Fertility Testing  (ICD-V26.21) 4)  Schizoaffective Disorder Unspecified  (ICD-295.70) 5)  Neurodermatitis (DERMATOLOGY)  (ICD-698.3) 6)  Erectile Dysfunction, Organic  (ICD-607.84) 7)  Polyarthralgia  (ICD-719.49) 8)  Uri  (ICD-465.9) 9)  Health Maintenance Exam  (ICD-V70.0) 10)  Hernia, Ventral  (ICD-553.20) 11)  Obstructive Sleep Apnea  (ICD-327.23) 12)  Leukocytosis  (ICD-288.8) 13)  Obesity  (ICD-278.00) 14)  Disorder, Tobacco Use  (ICD-305.1) 15)  Disorder, Bipolar Nos  (ICD-296.80) 16)  Hypertension  (ICD-401.9) 17)  Hyperlipidemia  (ICD-272.4) 18)  Diabetes Mellitus, Type II  (ICD-250.00) 19)  COPD  (ICD-496) 20)  Upper Gastrointestinal Hemorrhage, Acute  (ICD-578.9) 21)  Back Pain   (ICD-724.5) 22)  Low Back Pain, Acute  (ICD-724.2)  Medications Prior to Update: 1)  Lithium Carbonate 450 Mg Tbcr (Lithium Carbonate) .Marland Kitchen.. 1 Tablet Three Times A Day 2)  Metformin Hcl 1000 Mg  Tabs (Metformin Hcl) .... One Tab By Mouth Two Times A Day 3)  Lisinopril 10 Mg  Tabs (Lisinopril) .... One Tab By Mouth At Night 4)  Lisinopril-Hydrochlorothiazide 10-12.5 Mg  Tabs (Lisinopril-Hydrochlorothiazide) .... One Tab By Mouth Every Am 5)  Glimepiride 4 Mg  Tabs (Glimepiride) .... One Tab By Mouth Every Am 6)  Seroquel 400 Mg  Tabs (Quetiapine Fumarate) .... Take 1 Tablet By Mouth At Bedtime 7)  Effexor Xr 150 Mg  Xr24h-Cap (Venlafaxine Hcl) .Marland Kitchen.. 1 Tablet Daily By Mouth 8)  Neurontin 600 Mg Tabs (Gabapentin) .Marland Kitchen.. 1 At Bedtime 9)  Risperdal 0.5 Mg Tabs (Risperidone) .... Take 3 By Mouth At Bedtime and As Needed 10)  Klonopin 0.5 Mg Tabs (Clonazepam) .... Take One By Mouth At Bedtime 11)  Levemir Flexpen 100 Unit/ml Soln (Insulin Detemir) .Marland Kitchen.. 10 Units Subcutaneously Once A Day. 12)  Ergocalciferol 50000 Unit Caps (Ergocalciferol) .... One Tab By Mouth Weekly.  Allergies: 1)  ! Depakote (Divalproex Sodium)  Physical Exam  General:  Well-developed, obese well-nourished,in no acute distress; alert,appropriate and cooperative throughout examination, overwieght. Head:  normocephalic and atraumatic. Sinuses NT Eyes:  Irritation of the medial left sclera, improved over yesterday by pt report. Ears:  External  ear exam shows no significant lesions or deformities.  Otoscopic examination reveals clear canals, tympanic membranes are intact bilaterally without bulging, retraction, inflammation or discharge. Hearing is grossly normal bilaterally. Nose:  External nasal examination shows no deformity or inflammation. Nasal mucosa are pink and moist without lesions but with signif congestion and thick clear discharge.. Mouth:  Oral mucosa and oropharynx without lesions or exudates.  Teeth in good repair.  Thick PND. Neck:  No deformities, masses, or tenderness noted. Lungs:  Normal respiratory effort, chest expands symmetrically. Lungs are clear to auscultation, no crackles or wheezes. Heart:  Normal rate and regular rhythm. S1 and S2 normal without gallop, murmur, click, rub or other extra sounds.   Impression & Recommendations:  Problem # 1:  UNSPECIFIED VITAMIN D DEFICIENCY (ICD-268.9) Assessment Unchanged Cont weekly dose qnother 4 weeks and then have lab drawn to chweck level. Prob switch then to OTC.  Problem # 2:  HYPERTENSION (ICD-401.9) Assessment: Unchanged  His updated medication list for this problem includes:    Lisinopril 10 Mg Tabs (Lisinopril) ..... One tab by mouth at night    Lisinopril-hydrochlorothiazide 10-12.5 Mg Tabs (Lisinopril-hydrochlorothiazide) ..... One tab by mouth every am  BP today: 124/70 Prior BP: 120/80 (06/28/2009)  Labs Reviewed: K+: 4.4 (09/03/2008) Creat: : 1.4 (09/03/2008)   Chol: 159 (06/08/2008)   HDL: 34.7 (06/08/2008)   LDL: DEL (06/08/2008)   TG: 517 (06/08/2008)  Problem # 3:  DIABETES MELLITUS, TYPE II (ICD-250.00) Assessment: Improved Slowly improving. Is now on 25 units of Levemir. Discussed incr 3 untis if BS >150 for three days. Will be awhile before sugar in that range. We discussed tech at length. Pt motivated and has lost 8 pounds since last visit. The following medications were removed from the medication list:    Glimepiride 4 Mg Tabs (Glimepiride) ..... One tab by mouth every am His updated medication list for this problem includes:    Metformin Hcl 1000 Mg Tabs (Metformin hcl) ..... One tab by mouth two times a day    Lisinopril 10 Mg Tabs (Lisinopril) ..... One tab by mouth at night    Lisinopril-hydrochlorothiazide 10-12.5 Mg Tabs (Lisinopril-hydrochlorothiazide) ..... One tab by mouth every am    Levemir Flexpen 100 Unit/ml Soln (Insulin detemir) .Marland Kitchen... 25 units subcutaneously once a day.  Complete Medication List: 1)   Lithium Carbonate 450 Mg Tbcr (Lithium carbonate) .Marland Kitchen.. 1 tablet three times a day 2)  Metformin Hcl 1000 Mg Tabs (Metformin hcl) .... One tab by mouth two times a day 3)  Lisinopril 10 Mg Tabs (Lisinopril) .... One tab by mouth at night 4)  Lisinopril-hydrochlorothiazide 10-12.5 Mg Tabs (Lisinopril-hydrochlorothiazide) .... One tab by mouth every am 5)  Seroquel 400 Mg Tabs (Quetiapine fumarate) .... Take 1 tablet by mouth at bedtime 6)  Effexor Xr 150 Mg Xr24h-cap (Venlafaxine hcl) .Marland Kitchen.. 1 tablet daily by mouth 7)  Neurontin 600 Mg Tabs (Gabapentin) .Marland Kitchen.. 1 at bedtime 8)  Risperdal 0.5 Mg Tabs (Risperidone) .... Take 3 by mouth at bedtime and as needed 9)  Klonopin 0.5 Mg Tabs (Clonazepam) .... Take one by mouth at bedtime 10)  Levemir Flexpen 100 Unit/ml Soln (Insulin detemir) .... 25 units subcutaneously once a day. 11)  Ergocalciferol 50000 Unit Caps (Ergocalciferol) .... One tab by mouth weekly.  Patient Instructions: 1)  RTC 2 mos, check Vit D in 1 mo 268.9 2)  For Vit D, when therapeutic, start Vit D 1000Iu (OTC) twice a day. 3)  Cont Levemir with titrating 3 units  added if three days of sugars greater than 150. 4)  Take Guaifenesin by [facility] and getting MUCOUS RELIEF EXPECTORANT (400mg ), take 11/2 tabs by mouth AM and NOON. 5)  Drink lots of fluids anytime taking Guaifenesin.  6)  Wife due Apr 19th..preg goning fine.  Current Allergies (reviewed today): ! DEPAKOTE (DIVALPROEX SODIUM)

## 2010-08-30 NOTE — Progress Notes (Signed)
Summary: needs new glucometer and test strips   Phone Note Call from Patient   Caller: Patient Call For: Maxwell Leeks MD Summary of Call: Patient is asking if he can get rx for a new glucometer and test strips sent to [facility] [address]  Initial call taken by: Melody Comas,  May 19, 2010 5:05 PM  Follow-up for Phone Call        Please call and have pharmacy supply machine and strips his insurance will cover and  put in medication list..Marland KitchenI will sign then. Follow-up by: Maxwell Leeks MD,  May 19, 2010 5:10 PM  Additional Follow-up for Phone Call Additional follow up Details #1::        Pharmacys said they dont know what insurance will cover, pt needs to call his insurance company directly.  He said he will do this and will call back with information. Additional Follow-up by: Lowella Petties CMA,  May 20, 2010 4:13 PM    Additional Follow-up for Phone Call Additional follow up Details #2::    Glucometer and test strips called to [facility] [address], EMR updated.                 Lowella Petties CMA, AAMA  May 25, 2010 12:11 PM   New/Updated Medications: ONETOUCH ULTRA BLUE  STRP (GLUCOSE BLOOD) check blood sugar once daily ONETOUCH LANCETS  MISC (LANCETS) check blood sugar once daily Prescriptions: ONETOUCH LANCETS  MISC (LANCETS) check blood sugar once daily  #100 x prn   Entered by:   Lowella Petties CMA, AAMA   Authorized by:   Maxwell Leeks MD   Signed by:   Lowella Petties CMA, AAMA on 05/25/2010   Method used:   Telephoned to ...       [facility] [address] [phone]* (retail)       [address] [place], [place]  [id number]       Ph: [phone]       Fax: [phone]   RxID:   [id number] [place] BLUE  STRP (GLUCOSE BLOOD) check blood sugar once daily  #100 x prn   Entered by:   Lowella Petties CMA, AAMA   Authorized by:   Maxwell Leeks MD   Signed by:   Lowella Petties CMA, AAMA on 05/25/2010  Method used:   Telephoned to ...       [facility] [address] [phone]* (retail)       [address] [place], [place]  [id number]       Ph: [phone]       Fax: [phone]   RxID:   [id number]   Prior Medications: LITHIUM CARBONATE 450 MG TBCR (LITHIUM CARBONATE) 1 tablet three times a day METFORMIN HCL 1000 MG  TABS (METFORMIN HCL) one tab by mouth two times a day LISINOPRIL 10 MG  TABS (LISINOPRIL) one tab by mouth at night LISINOPRIL-HYDROCHLOROTHIAZIDE 10-12.5 MG  TABS (LISINOPRIL-HYDROCHLOROTHIAZIDE) one tab by mouth every am SEROQUEL 400 MG  TABS (QUETIAPINE FUMARATE) Take 1 tablet by mouth at bedtime EFFEXOR XR 150 MG  XR24H-CAP (VENLAFAXINE HCL) 1 tablet daily by mouth NEURONTIN 600 MG TABS (GABAPENTIN) 1 at bedtime RISPERDAL 0.5 MG TABS (RISPERIDONE) Take 3 by mouth at bedtime and as needed KLONOPIN 0.5 MG TABS (CLONAZEPAM) Take one by mouth at bedtime LEVEMIR FLEXPEN 100 UNIT/ML SOLN (INSULIN DETEMIR) 25 units subcutaneously once  a day. ERGOCALCIFEROL 50000 UNIT CAPS (ERGOCALCIFEROL) one tab by mouth weekly. ONETOUCH ULTRA BLUE  STRP (GLUCOSE BLOOD) check blood sugar once daily ONETOUCH LANCETS  MISC (LANCETS) check blood sugar once daily Current Allergies: ! DEPAKOTE (DIVALPROEX SODIUM)

## 2010-09-01 NOTE — Letter (Signed)
Summary: [place] No Show Letter  [place] [facility]  [address] [place], [place] [address]   Phone: [phone]  Fax: [phone]    08/11/2010 MRN: [id number]  [facility] [address] [place], [place]  [address]   Dear [name],   Our records indicate that you missed your scheduled appointment with __Laboratory___________________ on _1.11.2012___________.  Please contact this office to reschedule your appointment as soon as possible.  It is important that you keep your scheduled appointments with your physician, so we can provide you the best care possible.  Please be advised that there may be a charge for "no show" appointments.    Sincerely,   [place] [facility]

## 2010-09-01 NOTE — Progress Notes (Signed)
Summary: lantus  Phone Note Refill Request Call back at Home Phone [phone] Message from:  Fax from Pharmacy on August 12, 2010 9:21 AM  Refills Requested: Medication #1:  LANTUS SOLOSTAR 100 UNIT/ML SOLN 110 units injected daily Needs written [facility]    Method Requested: Pick up at Office Initial call taken by: Melody Comas,  August 12, 2010 9:21 AM Caller: Patient Call For: Crawford Givens MD  Follow-up for Phone Call        printed and signed.  Follow-up by: Crawford Givens MD,  August 12, 2010 1:47 PM  Additional Follow-up for Phone Call Additional follow up Details #1::        Patient Advised.  Prescription left at front desk.  Additional Follow-up by: Delilah Shan CMA (AAMA),  August 12, 2010 2:04 PM    Prescriptions: LANTUS SOLOSTAR 100 UNIT/ML SOLN (INSULIN GLARGINE) 110 units injected daily  #3 months x 3   Entered and Authorized by:   Crawford Givens MD   Signed by:   Crawford Givens MD on 08/12/2010   Method used:   Print then Give to Patient   RxID:   [id number]

## 2010-09-01 NOTE — Assessment & Plan Note (Signed)
Summary: 30 MIN 3-4 MONTH FOLLOW UP/[facility]   Vital Signs:  Patient profile:   45 year old male Height:      69 inches Weight:      279 pounds BMI:     41.35 Temp:     98.5 degrees F oral Pulse rate:   88 / minute Pulse rhythm:   regular BP sitting:   122 / 74  (left arm) Cuff size:   large  Vitals Entered By: Delilah Shan CMA  Dull) (August 22, 2010 9:06 AM) CC: 30 min.    3-4 mos. F/U   History of Present Illness: B foot pain. Sometimes worse with driving.  Some better after increase in insulin- he actually had a month w/o pain. This doesn't feel similar to the neuropathy now though.    Diabetes:  Using medications without difficulties:yes Hypoglycemic episodes:no Hyperglycemic episodes:no Feet problems:as above Blood Sugars averaging: 84-155 eye exam within last year: yes, 1.5 months ago.   Has follow up with psych.  Mood is stable per patient.  No SI/HI. In counseling.   Allergies: 1)  ! Depakote (Divalproex Sodium)  Past History:  Past Medical History: COPD Diabetes mellitus, type II Hyperlipidemia Hypertension BAD/schizoaffective d/o ED OSA In therapy at [facility] Sees Dr. Lolly Mustache [facility], Counselor Cleophas Dunker  Review of Systems       See HPI.  Otherwise negative.    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  Diabetes Management Exam:    Foot Exam (with socks and/or shoes not present):       Sensory-Pinprick/Light touch:          Left medial foot (L-4): normal          Left dorsal foot (L-5): normal          Left lateral foot (S-1): normal          Right medial foot (L-4): normal          Right dorsal foot (L-5): normal          Right lateral foot (S-1): normal       Sensory-Monofilament:          Left foot: diminished  Right foot: diminished       Inspection:          Left foot: normal          Right foot: normal       Nails:          Left foot: normal          Right foot: normal   Impression & Recommendations:  Problem # 1:  DIABETES MELLITUS, TYPE II (ICD-250.00) labs reviewed with patient.  Improved A1c.  No other change in meds now for Dm2.  His updated medication list for this problem includes:    Metformin Hcl 1000 Mg Tabs (Metformin hcl) ..... One tab by mouth two times a day    Lisinopril 10 Mg Tabs (Lisinopril) ..... One tab by mouth at night    Lisinopril-hydrochlorothiazide 10-12.5 Mg Tabs (Lisinopril-hydrochlorothiazide) ..... One tab by mouth every am    Lantus Solostar 100 Unit/ml Soln (Insulin glargine) .Marland KitchenMarland KitchenMarland KitchenMarland Kitchen 180 units injected daily  Problem # 2:  HYPERLIPIDEMIA (ICD-272.4) Start fenofibrate and work on diet.  He understands.  Samples given.  If tolerated, fill the rx.  He agrees.  His updated medication list for this problem includes:    Fenofibrate 160 Mg Tabs (Fenofibrate) .Marland Kitchen... 1 by mouth once daily  Problem # 3:  FOOT PAIN (ICD-729.5) loss of transverse arch.  He has orthotics.  I would get back in those and continue to work on glucose control.  He agrees.    Complete Medication List: 1)  Lithium Carbonate 450 Mg Tbcr (Lithium carbonate) .Marland Kitchen.. 1 tablet three times a day 2)  Metformin Hcl 1000 Mg Tabs (Metformin hcl) .... One tab by mouth two times a day 3)  Lisinopril 10 Mg Tabs (Lisinopril) .... One tab by mouth at night 4)  Lisinopril-hydrochlorothiazide 10-12.5 Mg Tabs (Lisinopril-hydrochlorothiazide) .... One tab by mouth every am 5)  Seroquel 400 Mg Tabs (Quetiapine fumarate) .... Take 1 tablet by mouth at bedtime 6)  Effexor Xr 150 Mg Xr24h-cap (Venlafaxine hcl) .Marland Kitchen.. 1 tablet daily by mouth 7)  Neurontin 600 Mg Tabs (Gabapentin) .Marland Kitchen.. 1 at bedtime 8)  Lantus Solostar 100 Unit/ml Soln (Insulin glargine) .Marland KitchenMarland KitchenMarland Kitchen 180 units injected daily 9)  Onetouch Ultra Blue Strp (Glucose  blood) .... Check blood sugar once daily 10)  Onetouch Lancets Misc (Lancets) .... Check blood sugar once daily 11)  Fenofibrate 160 Mg Tabs (Fenofibrate) .Marland Kitchen.. 1 by mouth once daily  Patient Instructions: 1)  I would recheck A1c and lipids in 3 months, 250.00 with OV a few days later.  OV. 2)  Get back into the orthotics and let me know if your foot pain gets worse.   3)  Starte the trilipix 1 a day.  When you run out, switch to the fenofibrate (as long as you are tolerating it).  If you have muscle aches or stomach upset, stop the medicine.  4)  Take care.  Prescriptions: FENOFIBRATE 160 MG TABS (FENOFIBRATE) 1 by mouth once daily  #90 x 3   Entered and Authorized by:   Crawford Givens MD   Signed by:   Crawford Givens MD on 08/22/2010   Method used:   Print then Give to Patient   RxID:   [id number]    Orders Added: 1)  Est. Patient Level IV [[address]782]    Current Allergies (reviewed today): ! DEPAKOTE (DIVALPROEX SODIUM)

## 2010-09-07 ENCOUNTER — Encounter (HOSPITAL_COMMUNITY): Payer: Self-pay | Admitting: Psychiatry

## 2010-09-09 ENCOUNTER — Encounter: Payer: Self-pay | Admitting: Family Medicine

## 2010-09-09 ENCOUNTER — Encounter (HOSPITAL_COMMUNITY): Payer: Self-pay | Admitting: Psychiatry

## 2010-09-12 ENCOUNTER — Encounter (HOSPITAL_COMMUNITY): Payer: Self-pay | Admitting: Marriage and Family Therapist

## 2010-09-20 ENCOUNTER — Encounter (HOSPITAL_COMMUNITY): Payer: BC Managed Care – PPO | Admitting: Marriage and Family Therapist

## 2010-09-21 NOTE — Letter (Signed)
Summary: CMN for CPAP  CMN for CPAP   Imported By: Kassie Mends 09/16/2010 11:17:08  _____________________________________________________________________  External Attachment:    Type:   Image     Comment:   External Document

## 2010-09-28 ENCOUNTER — Encounter (HOSPITAL_COMMUNITY): Payer: BC Managed Care – PPO | Admitting: Psychiatry

## 2010-10-04 ENCOUNTER — Encounter: Payer: Self-pay | Admitting: Family Medicine

## 2010-10-14 ENCOUNTER — Encounter (HOSPITAL_COMMUNITY): Payer: BC Managed Care – PPO | Admitting: Licensed Clinical Social Worker

## 2010-11-04 LAB — GLUCOSE, CAPILLARY
Glucose-Capillary: 249 mg/dL — ABNORMAL HIGH (ref 70–99)
Glucose-Capillary: 256 mg/dL — ABNORMAL HIGH (ref 70–99)

## 2010-11-04 LAB — COMPREHENSIVE METABOLIC PANEL
AST: 62 U/L — ABNORMAL HIGH (ref 0–37)
Albumin: 4 g/dL (ref 3.5–5.2)
Alkaline Phosphatase: 136 U/L — ABNORMAL HIGH (ref 39–117)
BUN: 18 mg/dL (ref 6–23)
GFR calc Af Amer: >60 mL/min (ref >60)
Potassium: 3.9 mEq/L (ref 3.5–5.1)
Sodium: 137 mEq/L (ref 135–145)
Total Protein: 7.4 g/dL (ref 6.0–8.3)

## 2010-11-04 LAB — LITHIUM LEVEL: Lithium Lvl: 1.8 mEq/L — ABNORMAL HIGH (ref 0.80–1.40)

## 2010-11-04 LAB — CBC
HCT: 39 % (ref 39.0–52.0)
Platelets: 318 10*3/uL (ref 150–400)
RDW: 13.6 % (ref 11.5–15.5)
WBC: 14.8 10*3/uL — ABNORMAL HIGH (ref 4.0–10.5)

## 2010-11-11 ENCOUNTER — Other Ambulatory Visit: Payer: Self-pay | Admitting: *Deleted

## 2010-11-16 ENCOUNTER — Other Ambulatory Visit: Payer: Self-pay

## 2010-11-17 ENCOUNTER — Other Ambulatory Visit (INDEPENDENT_AMBULATORY_CARE_PROVIDER_SITE_OTHER): Payer: BC Managed Care – PPO | Admitting: Family Medicine

## 2010-11-17 DIAGNOSIS — E119 Type 2 diabetes mellitus without complications: Secondary | ICD-10-CM

## 2010-11-17 DIAGNOSIS — E785 Hyperlipidemia, unspecified: Secondary | ICD-10-CM

## 2010-11-17 LAB — LIPID PANEL
Total CHOL/HDL Ratio: 6
VLDL: 140.6 mg/dL — ABNORMAL HIGH (ref 0.0–40.0)

## 2010-11-17 LAB — LDL CHOLESTEROL, DIRECT: Direct LDL: 95.8 mg/dL

## 2010-11-21 ENCOUNTER — Ambulatory Visit: Payer: Self-pay | Admitting: Family Medicine

## 2010-12-07 ENCOUNTER — Ambulatory Visit (INDEPENDENT_AMBULATORY_CARE_PROVIDER_SITE_OTHER): Payer: BC Managed Care – PPO | Admitting: Family Medicine

## 2010-12-07 ENCOUNTER — Encounter: Payer: Self-pay | Admitting: Family Medicine

## 2010-12-07 DIAGNOSIS — I10 Essential (primary) hypertension: Secondary | ICD-10-CM

## 2010-12-07 DIAGNOSIS — F319 Bipolar disorder, unspecified: Secondary | ICD-10-CM

## 2010-12-07 DIAGNOSIS — E119 Type 2 diabetes mellitus without complications: Secondary | ICD-10-CM

## 2010-12-07 DIAGNOSIS — E785 Hyperlipidemia, unspecified: Secondary | ICD-10-CM

## 2010-12-07 NOTE — Patient Instructions (Signed)
Call about psychiatry follow up and ask them about the neurontin. Recheck labs in 3 months with an OV a few days later.  OV.  Take care.

## 2010-12-07 NOTE — Progress Notes (Signed)
Labs reviewed with patient.    Has been dismissed from psych clinic.  Is now working on another clinic appointment.  No SI/HI.  Taking meds as prev rx'd except for the neurontin.   Diabetes:  Using medications without difficulties:yes Hypoglycemic episodes:no Hyperglycemic episodes:no Feet problems:no Blood Sugars averaging: 84-140, usually 90-120 "It's what I don't do, occ skipped dose of meds, missed a meal." He has some foot pain, but is off neurontin.  We talked about this.   Hypertension:    Using medication without problems or lightheadedness: yes Chest pain with exertion: no Edema:no Short of breath:no  Elevated Cholesterol: Using medications without problems:yes Muscle aches: no  PMH and SH reviewed.   Vital signs, Meds and allergies reviewed.  ROS: See HPI.  Otherwise nontributory.   GEN: nad, alert and oriented, obese HEENT: mucous membranes moist NECK: supple w/o LA CV: rrr.  no murmur PULM: ctab, no inc wob ABD: soft, +bs EXT: no edema SKIN: no acute rash  Diabetic foot exam: Normal inspection No skin breakdown No calluses  Normal DP pulses Normal sensation to light tough and monofilament except for L toes Nails normal

## 2010-12-07 NOTE — Assessment & Plan Note (Signed)
Improved, labs d/w pt.  No change in meds.

## 2010-12-07 NOTE — Assessment & Plan Note (Signed)
Improved, labs d/w pt.  No change in meds.  

## 2010-12-07 NOTE — Assessment & Plan Note (Signed)
No sig elevation, labs d/w pt.  No change in meds.

## 2010-12-07 NOTE — Assessment & Plan Note (Addendum)
Pt to re-est with psych.  He'll call about this.

## 2010-12-13 NOTE — Discharge Summary (Signed)
NAMESHMUEL, GIRGIS               ACCOUNT NO.:  [id number]   MEDICAL RECORD NO.:  [id number]          PATIENT TYPE:  INP   LOCATION:  1440                         FACILITY:  [facility]   PHYSICIAN:  Iva Boop, MD,FACGDATE OF BIRTH:  [birth year]   DATE OF ADMISSION:  12/28/2006  DATE OF DISCHARGE:  01/01/2007                               DISCHARGE SUMMARY   ADMITTING DIAGNOSES:  24. A 45 year old male with acute gastrointestinal bleed, rule out      diverticular.  2. Syncope/presyncope secondary to above.  3. Obesity.  4. Adult-onset diabetes mellitus.  5. Hypertension.  6. Recent soft tissue injury to the hip.  7. Bipolar disorder.   DISCHARGE DIAGNOSES:  39. A 45 year old male, with resolved presumed diverticular bleed.  2. Anemia, acute, secondary to blood loss.  3. Recent right hip injury.  4. Probable sleep apnea, workup in progress.  5. Obesity.  6. Adult-onset diabetes mellitus.  7. Hypertension.  8. Bipolar disorder.   CONSULTATIONS:  None.   PROCEDURES:  None.   BRIEF HISTORY:  Isaak is a 45 year old white male known to Dr. Hetty Ely  with the above outlined problems.  He developed acute onset of bright  red blood per rectum on the evening prior to admission.  He says it  happened 5 or 6 times over several hours, and then after the last  episode had a brief syncopal episode.  His wife called EMS, and he was  brought to the emergency room.  He had not passed any further blood in  the ER, had no complaints of melena, no abdominal pain, cramping,  nausea, or vomiting.  He has had a longer history of intermittent low-  grade rectal bleeding, primarily with blood noted on the tissue.  He had  had a colonoscopy done in October 2007 per Dr. Christella Hartigan, which did show  sigmoid and descending colon diverticulosis, and a 6 mm left colon polyp  which was removed,  as well as some internal hemorrhoids.  It was felt  that the hemorrhoids were probably the source of his previous  low-grade  bleeding.  At this time, he was admitted for supportive management and  further diagnostic workup with probable acute diverticular bleed.   LABORATORY STUDIES:  On admission, Dec 28, 2006, WBC was 13.1,  hemoglobin 11.3, hematocrit 32.4.  Serial values were obtained.  On December 30, 2006, hemoglobin was down to 8.2, hematocrit of 23.3, and January 01, 2007, hemoglobin up to 10.2, hematocrit of 29.4.  Pro time 13.9, INR of  1.1.  Electrolytes within normal limits.  BUN 16, creatinine 1.4,  albumin 3.6.  Liver function studies normal, with the exception of an  ALT of 60.   HOSPITAL COURSE:  The patient was admitted to the service of Dr.  Leone Payor, who was covering the hospital.  He was placed on a clear liquid  diet, IV fluid hydration, and serial H&Hs.  He did have a further active  bleeding and dropped his hemoglobin over the next 24 hours down to the  8.2 range.  He was transfused 2 units of packed  RBCs and then had no  further bleeding.  He was complaining of some pain in his right hip, had  injured it about a month ago, and had been taking Naprosyn at home as  well as Percocet.  We of course had discontinued the Naprosyn at the  time of his admission, and his hip continued to bother him up.  We were  able to advance his diet, and by January 01, 2007 he had had no further  active bleeding.  Hemoglobin was coming up at 10.2, and he was allowed  discharge to home, with instructions to follow up with Dr. Christella Hartigan in the  office on January 15, 2007 at 3:45 p.m., to call for any problems in the  interim.  He was to follow up with Dr. Hetty Ely and 1 week, and have his  blood counts repeated.  He was to remain off of aspirin and NSAIDs.  He  could take his Percocet as needed for his hip pain.   CONDITION ON DISCHARGE:  Stable.      Amy Esterwood, PA-C      Iva Boop, MD,FACG  Electronically Signed    AE/MEDQ  D:  02/26/2007  T:  02/27/2007  Job:  [id number]   cc:   Arta Silence, MD  Fax: [phone]

## 2010-12-13 NOTE — H&P (Signed)
NAME:  [name], [name] NO.:  [id number]   MEDICAL RECORD NO.:  [id number]          PATIENT TYPE:  IPS   LOCATION:  0303                          FACILITY:  [facility]   PHYSICIAN:  Jasmine Pang, M.D. DATE OF BIRTH:  11-09-1965   DATE OF ADMISSION:  01/29/2007  DATE OF DISCHARGE:                       PSYCHIATRIC ADMISSION ASSESSMENT   IDENTIFICATION:  This is a 45 year old white male who is married.  This  is a voluntary admission.   HISTORY OF PRESENT ILLNESS:  This patient presents on referral from Dr.  Lolly Mustache in our outpatient clinic after reported 3-4 days of having some  confusion.  On the morning of admission, he had gotten up, taken a  shower and then could not seem to get his clothes on correctly.  Had put  his undershorts on over his outer clothing.  He said he was aware that  it was wrong but somehow could not seem to get things right.  Also  complains of stuttering, having difficulty expressing his words,  difficulty with word finding and some speech apraxia.  He denies any  suicidal or homicidal thoughts.  Says his sleep has been at his usual  baseline.  Denies any auditory hallucinations.  He reports he was  recently discharged from the hospital after a GI bleed from taking  nonsteroidal anti-inflammatory drugs after developing some hip pain.  He  has been off work for the past month with various medical problems and  he also has a history of recent falls, one of which when he hurt his  right hip and lower back.   PAST PSYCHIATRIC HISTORY:  This is the patient's first admission to  [facility].  He is currently followed by Dr.  Kathryne Sharper in the [facility].  Dr. Sheela Stack last visit with him was Dec 03, 2006.  Patient is  diagnosed with bipolar disorder, mixed, rule out schizoaffective  disorder, bipolar-type versus bipolar type 2.  He does have a history of  previous psychiatric  admissions including admissions to [facility] was his last hospitalization.   SOCIAL HISTORY:  Married white male.  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  Stable employment.  He has indicated some recent conflict with his wife  but denies any legal problems and denies substance abuse.  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   MEDICAL HISTORY:  The patient is followed by Dr. Hetty Ely, his primary  care practitioner.  Also recently evaluated by Dr. Lina Sar, his  gastroenterologist, for GI bleed.  Current medical problems are status  post GI bleed secondary to NSAIDs, obstructive sleep apnea followed by  [facility], diabetes mellitus type 2, hypokalemia, obesity.  The patient is 125 kg.  Chronic back pain.   MEDICATIONS:  Metformin 500 mg p.o. b.i.d., Anusol suppositories as  needed for pain, Protonix 40 mg daily, Seroquel 300 mg p.o. q.h.s.,  lithium 450 mg, takes 3 tablets in  the morning along with 2 300 mg CR  tablets all in the morning, Geodon 40 mg, 3 tablets in the morning, 2  tablets in the evening.  The patient received K-Dur 20 mEq in the  emergency department for hypokalemia.  He was previously taking  Effexor 225 mg daily which was discontinued two weeks ago when he was  admitted for GI bleed.   POSITIVE PHYSICAL FINDINGS:  The patient's full physical exam was done  in the emergency room yesterday as noted in the record.  He is  uncooperative today with a cranial nerve exam and has remained in the  bed this morning.  Will get a full physical exam.  We are going to  repeat a neuro exam on him in a little bit as soon as he is cooperative.  He has been up and ambulatory today and in no distress.  Vital signs  revealed temperature afebrile, pulse 78 and regular, blood pressure  130/78, respirations 18.  He is approximately 5 feet 9  inches tall with  protuberant abdomen.   LABORATORY DATA:  CBC revealed WBC 15.9, hemoglobin 12.4, hematocrit  35.7 and platelets 469,000.  His MCV is 88.6.  Chemistry revealed sodium  137, potassium 3.3, chloride 100, carbon dioxide 26, BUN 17, creatinine  1.46, random glucose was 113.  Alcohol level less than 5.  Urine drug  screen was positive for opiates and negative for all other substances.  Lithium level 1.57 in the emergency room last evening.   MENTAL STATUS EXAM:  Obese male, irritable affect, withdrawn at times.  His cooperation is quite variable during the exam, irritated at being  here and is not sure why he is here.  Speech indicates erratic  production.  He struggles for words, does have some problems with anomia  and occasional stutter.  Fluency is impaired.  Mood is irritable.  Thought process is logical, coherent, generally goal-directed.  Denying  any hallucinations, suicidal or homicidal thought.  Cognition is  preserved.  He is oriented to person, place and situation.   DIAGNOSES:  AXIS I:  Rule out delirium secondary to lithium toxicity.  Rule out schizoaffective disorder.  AXIS II:  Deferred.  AXIS III:  Rule out motor ataxia and delirium secondary to lithium  toxicity, status post GI bleed, right hip pain, chronic back pain,  obstructive sleep apnea, diabetes mellitus type 2 and obesity,  hypokalemia.  AXIS IV:  Deferred.  AXIS V:  Current 35; past year not known.   PLAN:  To voluntarily admit the patient with 15-minute checks in place  with a goal of alleviating his confusion and we are going to decrease  his lithium to 450 mg t.i.d. today.  Recheck a lithium level and a TSH  and complete metabolic panel and magnesium level in the morning.  Continue his Effexor at 75 mg p.o. daily.  Decrease his Geodon to 40 mg  b.i.d. and continue his Seroquel at 300 mg q.h.s., 100 mg b.i.d. will be  added p.r.n. any agitation.  Meanwhile, we will also continue his  Anusol  suppositories.  We will also get a baseline EKG on this gentleman.   ESTIMATED LENGTH OF STAY:  Five days.  We are also going to hopefully  hear his wife's concerns.      Margaret A. Lorin Picket, N.P.      Jasmine Pang, M.D.  Electronically Signed    MAS/MEDQ  D:  01/30/2007  T:  01/30/2007  Job:  [id number]

## 2010-12-13 NOTE — Op Note (Signed)
NAME:  [name], [name] NO.:  [id number]   MEDICAL RECORD NO.:  [id number]          PATIENT TYPE:  INP   LOCATION:  1229                         FACILITY:  [facility]   PHYSICIAN:  Iva Boop, MD,FACGDATE OF BIRTH:  [birth year]   DATE OF PROCEDURE:  01/16/2007  DATE OF DISCHARGE:                               OPERATIVE REPORT   PROCEDURE:  Small bowel capsule endoscopy.   INDICATIONS FOR PROCEDURE:  GI bleeding with hematochezia, unrevealing  colonoscopy x2.  He has had two admissions for this.   PROCEDURE DATA:  Height 69 inches, weight 274 pounds, waist 50 inches.   FINDINGS:  There was an abrupt change to blood in the lumen at 3 minutes  30 seconds.  Likely in the mid or distal jejunum.  Small bowel mucosa  becomes fissured and irregular at about the same point with a loss of  normal mucosa and villous pattern.  Cannot reliably determine the start  of the colon if reached before battery expired.  There were no ulcers,  tumors, or diverticula seen.   ASSESSMENT/PLAN:  This looks like GI bleeding from the small intestine  with mucosal changes in the jejunum and ileum that seemed to be related  to the bleeding.  I cannot make a reliable diagnosis from the  appearance.  Crohn's disease, celiac disease, infiltrative malignancy,  or possible question other cause.   I think an ileoscopy versus double balloon enteroscopy could be useful.      Iva Boop, MD,FACG  Electronically Signed     CEG/MEDQ  D:  01/16/2007  T:  01/16/2007  Job:  [id number]

## 2010-12-13 NOTE — Assessment & Plan Note (Signed)
[facility]                             PULMONARY OFFICE NOTE   [name], [name]                      MRN:          [id number]  DATE:05/02/2007                            DOB:          29-Dec-1965    I saw [name] in followup today for his obstructive sleep apnea, as  well as preoperative evaluation.   [name] is currently on BiPAP for his sleep apnea, and is using a  full face mask with a heated humidifier.  He is going to sleep at around  9 o'clock.  He sleeps for about 5 to 6 hours and feels reasonably  refreshed.  He says he has good energy level during the day.  He is  using his machine 5 to 6 nights per week.  He is not having any problems  with nasal dryness, stuffiness or mouth dryness.  He says that he quit  smoking approximately 8 months ago after using Chantix.  He still has  some symptoms of dyspnea on exertion, but denies any symptoms of  coughing, wheezing, sputum production, chest pain, or chest tightness.   CURRENT MEDICATIONS:  1. Effexor 75 mg 3 tablets daily.  2. Seroquel 400 mg 1/2 tablet daily.  3. Lithium 450 mg 3 tablets daily.  4. Metformin 500 mg b.i.d.  5. Meloxicam 7.5 mg daily.  6. Soma b.i.d.  7. Oxycodone APAP as needed.   HE HAS AN ALLERGY TO DEPAKOTE.   PAST MEDICAL HISTORY:  Significant for:  1. Hypertension.  2. Diabetes.  3. Severe obstructive sleep apnea.  4. Low back pain.  5. Bipolar disorder.   FAMILY HISTORY:  Unremarkable.   SOCIAL HISTORY:  He is married.  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  He quit smoking 8 months ago, and used to  smoke 2 to 3 packs of cigarettes a day.  He quit drinking alcohol 3  years ago.   REVIEW OF SYSTEMS:  Unremarkable.   PHYSICAL EXAMINATION:  He is 302 pounds.  Temperature is 98.3.  Blood  pressure is 160/90.  Heart rate is 106.  Oxygen saturation is 97% on  room air.  HEENT:  Pupils are reactive.  There is no sinus  tenderness.  No oral  lesions.  No lymphadenopathy.  HEART:  With S1 and S2.  CHEST:  No wheezing or rales.  ABDOMEN:  Obese, soft, and nontender.  EXTREMITIES:  No edema.  NEUROLOGIC EXAM:  No focal deficits were appreciated.   Spirometry in my office today showed an FEV1 to FVC ratio of 80%.  His  FEV1 was 3.22 L, which was 83% predicted.  His FVC was 4.02 L, which is  85% predicted, and this would be consistent with normal spirometry.  Chest x-ray in my office today also showed no acute disease process.   IMPRESSION:  1. Severe obstructive sleep apnea, currently on BiPAP with a full face      mask and heated humidification.  I have advised him that he would      need to notify his anesthesiologist  prior to surgery that he is in      fact being treated for sleep apnea so appropriate precautions      should be taken.  These would include addressing possible      anesthetic, as well as sedation analgesia perioperatively.  He will      also need to continue on his BiPAP perioperatively.  What will may      be best is to provide him with an auto BiPAP machine in the      hospital, or alternatively he could bring his own machine to the      hospital for him to use perioperatively.  It also may be advisable      to monitor his oxygenation closely during the perioperative period.  2. Symptoms of shortness of breath.  My suspicion is it is probably      related to deconditioning and obesity.  Again, he had a normal      chest x-ray and normal spirometry.  I do not think any further      pulmonary interventions would be necessary prior to him undergoing      corrective back surgery.   I will follow up with him in approximately 3 to 4 months.     Coralyn Helling, MD  Electronically Signed    VS/MedQ  DD: 05/07/2007  DT: 05/07/2007  Job #: [id number]   cc:   Kerrin Champagne, M.D.

## 2010-12-13 NOTE — Procedures (Signed)
NAME:  SHAHIEM, [name]               ACCOUNT NO.:  [id number]   MEDICAL RECORD NO.:  [id number]          PATIENT TYPE:  OUT   LOCATION:  SLEEP CENTER                 FACILITY[facility]  [facility]   PHYSICIAN:  Coralyn Helling, MD        DATE OF BIRTH:  Apr 08, 1966   DATE OF STUDY:  01/24/2007                            NOCTURNAL POLYSOMNOGRAM   REFERRING PHYSICIAN:  Coralyn Helling, MD   INDICATION FOR STUDY:  This is an individual who had an overnight  polysomnogram on Dec 27, 2005, which showed an apnea/hypopnea index of  67 and an oxygen saturation nadir of 75%.  He is referred to the sleep  lab for a titration study.   EPWORTH SLEEPINESS SCORE:  14.   MEDICATIONS:  Eskalith, lithium, Seroquel, Percocet, Geodon and  Aldactone.  The patient took a Percocet and Seroquel prior to arrival at  the sleep center.   SLEEP ARCHITECTURE:  Total recording time was 415 minutes.  Total sleep  time was 384 minutes.  Sleep latency was 4.5 minutes, which is  significantly reduced.  REM latency was 45 minutes, which is reduced.  The patient was observed in all stages of sleep, but had a relative  reduction in percentage slow-wave sleep to 6% of the study and REM sleep  to 6% of the study.  He also had a significant amount of alpha  intrusion, as well as sweat artifact, which made assessment of the EEG  portion of the test difficult.  He slept in both the supine and non-  supine positions.   RESPIRATORY DATA:  The average respiratory rate was 14.  The patient was  initially titrated from a C-PAP pressure setting of 6 to 19 cm of water.  Respiratory events were controlled above a C-PAP pressure setting of 13.  However, he had persistent episodes of snoring and was subsequently  switched to BiPAP, starting at a pressure of 20/17 and then titrated  through to a pressure setting of 22/17.  Even at these pressure  settings, he still had some episodes of break-through snoring.   OXYGEN DATA:  The baseline oxygenation was  93%.  The oxygen saturation  nadir was 82%.  At a BiPAP pressure of 20/17, the mean oxygenation  during non-REM sleep was 95% and the minimal oxygenation during non-REM  sleep was 93%.   CARDIAC DATA:  The rhythm strip showed normal sinus rhythm.   MOVEMENT-PARASOMNIA:  The periodic limb movement index was 20.8   IMPRESSIONS-RECOMMENDATIONS:  This was a titration study.  Above a C-PAP  pressure setting of 13, the respiratory events were controlled.  However, he continued to have persistent episodes of snoring and, as a  result, was eventually changed to BiPAP.  What I would recommend is that  we start the patient on BiPAP pressure setting  of 18/14 and then monitor him for his clinical response.  Please note  that scoring of this study was somewhat difficult due to significant  amount of alpha intrusion and sweat artifact.      Coralyn Helling, MD  Diplomat, American Board of Sleep Medicine  Electronically Signed     VS/MEDQ  D:  01/29/2007 20:43:49  T:  01/30/2007 11:12:20  Job:  [id number]

## 2010-12-13 NOTE — Discharge Summary (Signed)
NAMESATISH, [name]               ACCOUNT NO.:  [id number]   MEDICAL RECORD NO.:  [id number]          PATIENT TYPE:  INP   LOCATION:  1540                         FACILITY:  [facility]   PHYSICIAN:  Barbette Hair. Artist Pais, DO      DATE OF BIRTH:  10/26/1965   DATE OF ADMISSION:  01/13/2007  DATE OF DISCHARGE:  01/20/2007                               DISCHARGE SUMMARY   DISCHARGE DIET:  1. Gastrointestinal bleed, presumed secondary to nonsteroidal anti-      inflammatory drug enteropathy.  2. Anemia secondary to acute blood loss.  3. Type 2 diabetes.  4. Bipolar disorder.  5. Hypertension.  6. Obstructive sleep apnea.  7. Chronic low back pain and right hip pain   DISCHARGE MEDICATIONS:  1. Lithium 300 mg capsules once daily.  2. Glucophage 500 mg twice daily.  3. Geodon 40 mg twice daily.  4. Seroquel 300 mg at bedtime.  5. Lithium extended-release 150 mg 3 tablets once a day.  6. Omeprazole 20 mg twice daily.  7. Percocet 7.5/325 every 6 hours as needed.  8. The patient instructed not to take more than additional 1000 mg of      Tylenol a day.  9. The patient is not to take any NSAIDs over the counter.   FOLLOWUP APPOINTMENTS:  She is to call Dr. Marvell Fuller office for 2-week  followup; also call Dr. Hetty Ely, his primary care physician, for 1-week  followup.   HOSPITAL COURSE:  The patient is a 45 year old admitted on June 16  secondary to significant that GI bleeding.  The patient had associated  presyncope which was felt to be secondary to his blood loss versus vagal  reaction.  He was transfused PRBC's then underwent colonoscopy on January 14, 2007, by Dr. Lina Sar.  She found no signs of active bleeding,  positive diverticulosis and internal hemorrhoids.   The patient continued to have bright red blood per rectum, and he was  sent for capsule endoscopy where GI bleeding from small intestines was  discovered.  It is noted that there were changes in the jejunum ileum  that seemed  to be related to bleeding.   Referral to [facility] for double balloon enteroscopy was  considered; however, the patient's bleeding stabilized.  He underwent  Meckel scan to rule out Meckel diverticulum.  This scan was negative.   Dr. Lina Sar felt that the patient's bleeding was most likely due to  NSAID ulcers or enteropathy and, if patient's blood counts remained  stable, can follow up with Dr. Leone Payor as an outpatient.  If he has  further bleeding issues, he will need to follow up with [facility] for double balloon enteroscopy.   He already has appointment with orthopedics regarding his low back pain.  He may benefit from neurosurgical consultation.  He described radiation  of symptoms to his right lower extremity.   TYPE 2 DIABETES:  The patient's blood sugar remained stable. On day of  discharge, his CBG was 165.  The patient is to maintain metformin.    BIPOLAR DISORDER:  Stable.  His Effexor was discontinued due to small  possibility of exacerbating bleeding.  This may be restarted as an  outpatient if needed.   CONDITION ON DISCHARGE:  Improved.   Hemoglobin 9.8, hematocrit 29.1 on discharge. His most recent BMET  showed sodium 144, potassium 3.2, chloride 112, CO2 25, glucose 114, BUN  4, creatinine of 1.06.      Barbette Hair. Artist Pais, DO  Electronically Signed     RDY/MEDQ  D:  01/20/2007  T:  01/20/2007  Job:  [id number]   cc:   Arta Silence, MD  Fax: [phone]

## 2010-12-13 NOTE — Discharge Summary (Signed)
[name], FUSSELMAN NO.:  [id number]   MEDICAL RECORD NO.:  [id number]          PATIENT TYPE:  IPS   LOCATION:  0303                          FACILITY:  [facility]   PHYSICIAN:  Jasmine Pang, M.D. DATE OF BIRTH:  [birth year]   DATE OF ADMISSION:  01/29/2007  DATE OF DISCHARGE:  01/31/2007                               DISCHARGE SUMMARY   IDENTIFICATION:  This is a 45 year old white male who was married.  He  was admitted on a voluntary basis on January 29, 2007   HISTORY OF PRESENT ILLNESS:  The patient presents on referral from Dr.  Lolly Mustache in our outpatient clinic after a reported 3-4 days of having some  confusion.  On the morning of admission he had gotten up, taken a  shower, and then could not seem to get his clothes on correctly.  He had  put his undershorts on over his outer clothing.  He said he was aware  that it was wrong,  but somehow he could not seem to get things right.  He also complained of stuttering, having difficulty expressing his  words, difficulty with word-finding and some speech apraxia.  He denies  any suicidal or homicidal thoughts.  He says his sleep has been at his  usual baseline.  He denies any auditory hallucinations.  He reports he  was recently discharged from the hospital after GI bleed from taking  nonsteroidal anti-inflammatory drugs after developing some hip pain.  He  has been off work for the past month with various medical problems, and  has also had a history of recent falls, one of which when he hurt his  right hip and lower back.  This is the patient's first admission to  [facility].  He is currently followed by Dr.  Kathryne Sharper in the [facility].  Dr.  Sheela Stack last visit with him was Dec 03, 2006.  The patient is diagnosed  with bipolar disorder, mixed; rule out schizoaffective disorder.  Bipolar disorder versus bipolar type 2.  He does have a history of  previous  psychiatric admissions including admissions to [facility] being his last hospitalization.  The patient is  followed by Dr. Lina Sar, his gastroenterologist, for GI bleed.   CURRENT MEDICAL PROBLEMS:  Status post GI plead secondary to NSAIDs,  obstructive sleep apnea followed by [facility] pulmonology, diabetes  mellitus type 2, hypokalemia, obesity and chronic back pain.  The  patient is on metformin 500 mg p.o. b.i.d., Anusol suppositories as  needed for pain, Protonix 40 mg daily, Seroquel 300 mg p.o. nightly,  lithium 450 mg 3 tablets in the morning and two 300-mg CR tablets all in  the morning, Geodon 40 mg 3 tablets in the morning and 2 tablets in the  evening.  The patient received K-Dur 20 mEq in the emergency department  for hypokalemia.  He was previously taking Effexor 225 mg, which was  discontinued 2 weeks ago when he was admitted for a GI bleed.   PHYSICAL FINDINGS:  The patient's full physical exam  was done in the  emergency room.  He had no acute medical or physical problems.  Admission laboratories were done in the ED prior to admission.  CBC  revealed a WBC of 15.9, hemoglobin of 12.4, hematocrit of 35.7 and  platelets of 469,000.  His MCV is 88.6.  Chemistry revealed sodium of  137, potassium 3.3, chloride 100, carbon dioxide 26, BUN 17, creatinine  1.46.  Random glucose was 113.  Alcohol level less than 5.  Urine drug  screen was positive for opiates and negative for all other substances.  Lithium level was elevated at 1.57 (0.8 to 1.4).   HOSPITAL COURSE:  Upon admission, the patient was continued on metformin  500 mg p.o. b.i.d.  He was also continued on lithium carbonate 600 mg  p.o. daily, to be held until seen by the nurse practitioner, and  Eskalith 450 mg p.o. t.i.d., and to be held until seen by the nurse  practitioner.  He was restarted on his Seroquel 300 mg p.o. nightly and  Geodon 120 mg p.o. q.a.m. and 80 mg p.o. nightly.  On January 30, 2007, he  was started on the Effexor XR 75 mg daily.  Geodon dose was changed to  40 mg p.o. b.i.d.  Lithium was changed to 450 mg p.o. q.a.m., 3 p.m. and  6 p.m.  On January 30, 2007, the patient was started on Percocet 1-2 tablets  p.o. q.6 hours p.r.n. pain.  On January 30, 2007, Geodon was increased to 80  mg p.o. b.i.d.  On January 30, 2007, the patient's Geodon was discontinued,  as ordered by his outpatient doctor, Dr. Kathryne Sharper.  The patient  initially was quite confused.  This appeared to be due to a lithium  toxicity.  As hospitalization progressed, his mental status markedly  improved.  He was friendly and cooperative with good eye contact.  Speech was normal rate and flow.  Psychomotor activity was within normal  limits.  Mood was euthymic.  Affect wide range with no suicidal or  homicidal ideation.  No thoughts of self-injurious behavior.  No  auditory or visual hallucinations.  No paranoia or delusions.  Thoughts  were logical and goal-directed.  Thought content no predominant theme.  Cognitive was grossly back to baseline.  It was felt the patient had  been delirious secondary to lithium toxicity.  He stabilized and this  cleared.  His lithium level done on January 31, 2007 was 0.94 (0.8 to 1.4),  and no longer toxic.  It was felt he was safe to be discharged home to  his wife on January 31, 2007.   DISCHARGE DIAGNOSES:  AXIS I:  Delirium secondary to lithium toxicity.  Schizoaffective disorder, bipolar type versus bipolar type 2.  AXIS II:  None.  AXIS III:  Status post gastrointestinal bleed, right hip pain, chronic  back pain, obstructive sleep apnea, diabetes mellitus type 2 and  obesity, hypokalemia.  AXIS IV:  None.  AXIS V:  Global assessment of functioning upon discharge his 50.  Global  assessment of functioning upon admission was 35.  Global assessment of  functioning highest past year 65-70.   DISCHARGE PLANS:  There were no specific activity level or dietary   restrictions.   POST-HOSPITAL CARE PLANS:  The patient will be seen by Dr. Lolly Mustache on  July 7 at 4:00 p.m.   DISCHARGE MEDICATIONS:  1. Eskalith 450 mg q. 9 a.m., 3:00 p.m. and supper. (stop the 300-mg  capsules).  2. Glucophage 500 mg at 9 a.m. and supper.  3. Seroquel 300 mg at bedtime.  4. Effexor 75 mg daily.   The Geodon was stopped and he was advised not to take any.  He was also  going to follow up with Verdell Face, MD on Wednesday, July 9th, at  12:30 p.m.      Jasmine Pang, M.D.  Electronically Signed     BHS/MEDQ  D:  03/04/2007  T:  03/05/2007  Job:  [id number]

## 2010-12-13 NOTE — Discharge Summary (Signed)
NAME:  [name], [name]                   ACCOUNT NO.:  O   MEDICAL RECORD NO.:  [id number]          PATIENT TYPE:  INP   LOCATION:  6045                         FACILITY:  [facility]   PHYSICIAN:  Kerrin Champagne, M.D.   DATE OF BIRTH:  [birth year]   DATE OF ADMISSION:  05/28/2007  DATE OF DISCHARGE:  06/03/2007                               DISCHARGE SUMMARY   ADMISSION DIAGNOSIS:  1. Isthmic spondylolisthesis, L5-S1, with right greater than left      neural foraminal narrowing.  2. Degenerative disk disease, L5-S1.  3. Degenerative disk disease, L4-5, with lateral recess stenosis      bilaterally.  4..  Bipolar disorder.  1. Sleep apnea treated with BiPAP.  2. History of gastrointestinal bleed.  3. Diabetes mellitus.  4. Hypertension.  5. Chronic obstructive pulmonary disease.  6. Dyslipidemia.   DISCHARGE DIAGNOSES:  1. Isthmic spondylolisthesis, L5-S1, with right greater than left      neural foraminal narrowing.  2. Degenerative disk disease, L5-S1.  3. Degenerative disk disease, L4-5, with lateral recess stenosis      bilaterally.  4..  Bipolar disorder.  1. Sleep apnea treated with BiPAP.  2. History of gastrointestinal bleed.  3. Diabetes mellitus.  4. Hypertension.  5. Chronic obstructive pulmonary disease.  6. Dyslipidemia.  7. Posthemorrhagic anemia.  8. Postop ileus resolved at discharge.  9. Tachycardia resolved at discharge.  10.Azotemia.   PROCEDURE:  On May 28, 2007 the patient other underwent:  1. L5-S1 Gill procedure with central decompression decompressing      bilateral L5 and S1 nerve roots.  2. Central decompression L4-5 with bilateral lateral recess      decompression.  3. Posterior lumbar interbody fusion via transforaminal lumbar      interbody fusion approach, right L4-5 and right L5-S1 with      posterior segment instrumentation L4 to sacrum utilizing pedicle      screws and rods, bone marrow aspirate right and left L4 pedicles      for VITOSS  application.  This was performed by Dr. Otelia Sergeant assisted      by Maud Deed [facility] under general anesthesia.   CONSULTATIONS:  1. Dr. Thomasena Edis of physical medicine and rehabilitation.  2. Dr. Felicity Coyer.   BRIEF HISTORY:  The patient is a 45 year old male with isthmic  spondylolisthesis at L5-S1, developing progressing disability related to  his lumbar spine with inability to stand or ambulate.  He has history of  morbid obesity and other comorbidities.  He is requiring chronic  narcotic medication for pain control.  It was felt he would benefit from  surgical intervention and was admitted for the procedure as stated  above.   BRIEF HOSPITAL COURSE:  The patient tolerated the procedure under  general anesthesia without complications.  Postoperatively neurovascular  motor function of the lower extremities was noted to be intact.  Hemovac  drain was discontinued on the first postoperative day and wound was  checked daily thereafter with wound healing well during the hospital  stay.  The patient's sleep apnea was treated with CPAP.  He initially  had good pulmonary control, but then developed elevated white count as  well as fevers.  A medical consult was obtained through Dr. Felicity Coyer.  He was noted to clinically have an ileus which was treated as well as  acute renal failure, which was treated as well.  He was transferred to  ICU for further stabilization.  The patient had tachycardia and  hypotension.  He was started on vancomycin Zosyn for empiric treatment  of possible sepsis.  Fortunately over just a 24-hour period, he improved  significantly.  His physical therapy which had been held was restarted.  He was ambulating with the physical therapist utilizing a walker and  wearing his Aspen LSO without difficulty.  Prior to discharge he was  able to demonstrate safety and donning and doffing the brace as well as  in ambulating with a walker.  He was able to ambulate 300 feet prior to   discharge.  He eventually was able to have bowel movement and was able  to resume a diabetic diet.  The patient's renal function improved  significantly.  His vancomycin and Zosyn was discontinued.  The patient  was placed on sliding scale insulin coverage and eventually placed back  on his oral agents.  He was treated with handheld nebulizers as well as  his BiPAP during the hospital stay.  During this time frame he was  thought to possibly need a skilled nursing facility versus rehab, but  was able to progress with physical therapy to the point of being  independent and all of his medical issues resolved; therefore, he was  able to be discharged to his home.  He did have posthemorrhagic anemia  which required blood transfusion with a total of 2 units of packed red  blood cells been given.  Initially, hemoglobin and hematocrit 14.7 and  43.3.  Hemoglobin and hematocrit lowest value of 8.5 and 25.3.  After  transfusion values 10.1 and 29.9.  Chemistry studies and one episode of  hyponatremia.  Elevated potassium as high as 6.2, but stable at  discharge at 3.5.  BUN and creatinine during his crisis noted to be 28  and 2.05.  However, returned to normal values at discharge with BUN 12  and creatinine 0.96.  Hemoglobin A1c 7.4.  Lithium level 0.99.  Urinalysis on admission within normal limits.  Repeat on October 30 with  hyaline cast, 0-2 wbc's and 3-6 rbc's.  Urine culture showed no growth.  Blood cultures also no growth.  Chest X-Ray: On May 30, 2007 with  increase in bibasilar atelectasis.  EKG on admission:  Normal sinus  rhythm.  No significant change since last tracing confirmed by Dr.  Elsie Lincoln.   PLAN:  The patient was discharged to his home.  Arrangements for home  health evaluation and durable medical equipment made prior to discharge.  He will continue to ambulate using a walker and wearing his brace at all  times when out of bed.  He will resume a low-carbohydrate diet.  No   bending, lifting, twisting, or driving.  He may shower.  Dressing change  daily.   DISCHARGE MEDICATIONS:  1. OxyContin 10 mg q.12 h.  2. OxyIR one to two every 4-6 hours as needed for breakthrough pain.  3. Robaxin 500 mg one every 8 hours as needed for spasm.   He will resume his home medications with the exception of Soma and  Percocet.   FOLLOW UP:  Follow up with Dr. Otelia Sergeant 2  weeks from surgery.   CONDITION ON DISCHARGE:  Stable.      Wende Neighbors, P.A.      Kerrin Champagne, M.D.  Electronically Signed    SMV/MEDQ  D:  08/08/2007  T:  08/09/2007  Job:  [id number]
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
## 2010-12-13 NOTE — H&P (Signed)
NAME:  [name], FREI NO.:  [id number]   MEDICAL RECORD NO.:  [id number]          PATIENT TYPE:  EMS   LOCATION:  ED                           FACILITY[facility]  [facility]   PHYSICIAN:  Iva Boop, MD,FACGDATE OF BIRTH:  05/04/1966   DATE OF ADMISSION:  12/28/2006  DATE OF DISCHARGE:                              HISTORY & PHYSICAL   CHIEF COMPLAINT:  Bleeding.   HISTORY:  This is a 45 year old married white man that was in his usual  state of health until about 9:30 the night before admission.  On Dec 27, 2006, he developed spontaneous passage of bright red blood per rectum  mixed with a little bit of loose brown stool.  This happened 5 or 6,  maybe 7 times over the several hours and then on the last one he had  brief syncope and his wife called EMS and he was brought to the  hospital.  He is not passed further blood here.  There has been no  melena.  There is no pain, nausea or vomiting.  He has had a chronic  history of intermittent mild rectal bleeding, mainly on the toilet  paper.  He had a colonoscopy performed by Dr. Rob Bunting October  2007, which demonstrated sigmoid and descending colon diverticulosis, a  6 mm left colon polyp that was hyperplastic, and there is a suspicion of  resolved hemorrhoids that had been causing previous bleeding.  He had  not really had any significant bleeding in some time, it sounds like.  He has had a hip injury on the right and has been on Naprosyn and  meloxicam for several weeks.  GI review of systems is otherwise  negative.   MEDICATIONS:  1. Seroquel 300 mg daily.  2. Lithium carbonate three tablets of 450 mg timed-release a day as      well as regular-release 300 mg twice daily.  3. Geodon 120 mg morning, 80 mg evening.  4. Effexor XR 225 mg each day.  5. Metformin 500 mg twice daily.  6. Percocet 7.5/325 mg every 6 hours p.r.n. pain.  7. Spironolactone/hydrochlorothiazide 25/25 mg daily.   DRUG ALLERGIES:   DEPAKOTE.   PAST MEDICAL HISTORY:  1. Bipolar disorder.  2. Obesity.  3. Obstructive sleep apnea.  4. Diabetes mellitus type 2.  5. Hypertension.  6. Recent soft tissue injury to the right hip.  7. Diverticulosis coli.  8. Psoriasis.   FAMILY HISTORY:  No GI bleeding or colon cancer problems.   SOCIAL HISTORY:  He is married.  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  Married.  Lives with his wife and a dog.  He quit smoking 3  months ago.  No alcohol.   REVIEW OF SYSTEMS:  Dyspnea and weakness, the syncope.  The right hip  pain.  Psoriasis rash on the elbows.  Eyeglasses.  All other systems are  negative.   PHYSICAL EXAMINATION:  GENERAL:  Physical exam reveals an obese white  man in no acute distress.  He does look mildly ill.  VITAL SIGNS:  Temperature is 97.7,  blood pressure 140/73, pulse 109,  respirations 28.  HEENT:  The eyes are anicteric.  Mouth and posterior pharynx free of  lesions.  The telangiectasia.  NECK:  Supple.  No thyromegaly or mass.  CHEST:  Clear.  HEART:  S1-S2, mildly tachycardiac.  No rubs, murmurs or gallops heard.  ABDOMEN:  Soft and nontender without organomegaly or mass except for  minimal tenderness to deep palpation the left lower quadrant.  RECTAL:  Scanty red blood and scanty brown stool that is loose.  He is  tender on rectal exam, especially in the posterior aspect of the anus  more so than the anterior.  It is not exquisitely tender like a fissure,  however.  Prostate is normal.  EXTREMITIES:  Lower extremities show trace peripheral edema bilaterally.  SKIN:  There is a small healing ulcer in the left mammary area.  NEUROLOGIC:  Mental status  He is alert and oriented x3.  Affect is  appropriate.   LABORATORY DATA:  So far we have CBC with a hemoglobin of 11.3, MCV 91,  platelet count 266.  CMET is pending.   ASSESSMENT:  1. Gastrointestinal bleeding.  Diverticulosis causing the bleeding      seems most likely based upon history.   He is mildly tender at the      rectum and there is a suggestion of possible hemorrhoids, I should      say, but the clinical history is more compatible with a more      significant bleed.  2. Syncope/presyncope, brief.  I think it was related to his bleeding      and perhaps a vagal reaction.  3. Obesity.  4. Next diabetes mellitus type 2.  5. Hypertension.  6. Recent soft tissue injury to the hip.  7. Bipolar disorder.   PLAN:  1. Admit the patient to the hospital and observe.  2. Serial hemoglobins.  3. Transfuse if needed, I have reviewed the risks of hepatitis,      transfusion reaction and HIV infection with the patient.  4. He may need repeat lower endoscopy was sigmoidoscopy or      colonoscopy, he just had one about 7 months ago.  If he resolves      quickly, we may not need to do that.  If he has persistent      bleeding, colonoscopy versus tagged bleeding scan could be      indicated.  5. Hypertension, I am going to hold his medication for the time being.  6. Diabetes mellitus.  Will hold metformin use sliding scale insulin.  7. Bipolar disorder.  Will continue those medications.  8. Sleep apnea.  Supplemental oxygen, plus/minus his sleep apnea mass      when he can get it.  9. Note:  We are going to discontinue his nonsteroidals at this time      as they certainly may have had something to      do with triggering his bleed.  It certainly sounds like they were      reasonable treatment options for his right hip area problem,      however.   I appreciate the opportunity to care for this patient.      Iva Boop, MD,FACG  Electronically Signed     CEG/MEDQ  D:  12/28/2006  T:  12/28/2006  Job:  [id number]   cc:   Arta Silence, MD  Fax: [phone]

## 2010-12-13 NOTE — Op Note (Signed)
[name], [name]               ACCOUNT NO.:  [id number]   MEDICAL RECORD NO.:  [id number]          PATIENT TYPE:  INP   LOCATION:  5033                         FACILITY:  [facility]   PHYSICIAN:  Kerrin Champagne, M.D.   DATE OF BIRTH:  1965/11/24   DATE OF PROCEDURE:  05/28/2007  DATE OF DISCHARGE:                               OPERATIVE REPORT   PREOPERATIVE DIAGNOSES:  1. Isthmic spondylolisthesis L5-S1 with right-sided greater than left-      sided neural foraminal narrowing.  2. Degenerative disk disease L5-S1.  3. Degenerative disk disease L4-5 with lateral recess stenosis, left      side and right side.   POSTOPERATIVE DIAGNOSES:  1. Isthmic spondylolisthesis L5-S1 with right-sided greater than left-      sided neural foraminal narrowing.  2. Degenerative disk disease L5-S1.  3. Degenerative disk disease L4-5 with lateral recess stenosis, left      side and right side.   PROCEDURE:  1. L5-S1 Gill procedure with central decompression, removal of spinous      process, lamina and bilateral inferior articular processes of L5,      removal of the neural arch bilateral pars interarticularis regions,      decompression of bilateral L5 and S1 nerve roots.  2. Decompression of L4-5 with central laminectomy and removal of      bilateral lateral recess stenosis via partial medial facetectomies,      left L4-5 and right L4-5 for decompression of lateral recesses.  3. Posterior lumbar interbody fusion via transforaminal lumbar      interbody fusion approach.  Right L4-5 and right L5-S1 utilizing an      11-mm lordotic Concorde DePuy cage at the L4-5 level and a 10-mm      lordotic Concorde DePuy cage at the L5-S1 level.  Local bone graft      was utilized for the cages and interbody fusion area.      Posterolateral fusion L4 to S1, 2 levels, utilizing combination of      VITOSS and local bone graft.  Posterior segmental instrumentation      from L4 to S1 utilizing pedicle screws and  rods, DePuy Monarch      type.  Bone marrow aspiration right and left L4 pedicles for VITOSS      application purposes.   SURGEON:  Kerrin Champagne, M.D.   ASSISTANT:  Wende Neighbors, P.A.   ANESTHESIA:  General via oral endotracheal intubation.   ANESTHESIOLOGIST:  Quita Skye. Krista Blue, M.D.   ESTIMATED BLOOD LOSS:  600 mL.   CELL SAVER RETURNED:  None.   DRAINS:  Foley to straight drain, right lower lumbar Hemovac drain.   COMPLICATIONS:  Patient had left L4 pedicle screw placement,  unfortunately with breaking out of the screw over the inferior and  lateral aspect of the pedicle, requiring its removal.  No intraoperative  neuromonitoring abnormalities noted.  Remaining screws measuring at 25  or greater soft tissue resistance measurements, the 25 on the right side  at L4; the remaining screws measured at 45 or greater resistance.  HISTORY OF PRESENT ILLNESS:  This is a 45 year old male with history of  morbid obesity.  He has an isthmic spondylolisthesis at L5-S1,  developing progressive disability related to his back condition with  inability to stand or ambulate any distance.  He is requiring chronic  narcotic medications to relieve his pain.  He is brought to the  operating room to undergo a decompression for spondylolisthesis at L5-S1  with right-sided leg pain greater than left and L4-5 changes of  degenerative disk disease and lateral recess stenosis.   INTRAOPERATIVE FINDINGS:  As above.   DESCRIPTION OF PROCEDURE:  After adequate general anesthesia, patient  had neuromonitoring leads placed.  He was placed into a prone position  using the [facility] spine table, all pressure points well padded, the arms  at the side at 90-90 flexion.  The iliac crest pads and thigh pads all  well placed and the chest area padded with foam pads to prevent any  pressure from the frame.  PAS stockings, both lower extremities.  He had  Foley catheter placed prior to his turning to a prone  position with  plenty of help available for turning.  This is a large-sized individual  of 300+ pounds.  The patient then underwent a standard prep with  DuraPrep solution over the mid thoracic to midsacral levels.  This was  done following first using a hair clipper to clip his back of any hair  present.  Draped in the usual manner, and a Vi-Drape was used.  Standard  preoperative antibiotics were used.  Patient then had incision made at  the expected L4 to sacral level in the midline to level of the iliac  crest, extended to the S2 level inferiorly and then superiorly to the L3  and L2 levels.  Incision through skin and subcutaneous layers down to  the spinous processes in the midline.  Clamps placed over the spinous  processes at L5 and L4, and these areas were marked for continued  identification throughout the remainder of the case.  The neural arch at  L5 was distinctly loose and able to be moved superiorly-inferiorly quite  easily with Kocher clamp, identifying this level, as well.  Electrocautery was used then to incise the lumbodorsal fascia off of the  plantar aspect spinous process extending from L2 to S2 and S3.  Two  Cobbs were used to elevate the paralumbar muscles off the sacrum, off  the bilateral lumbar regions.  The patient had cerebellar retractors  placed bilaterally, both superiorly-inferiorly.  The facet joints at the  L5-S1 level, L4-5 level were completely excised, exposure obtained out  over the sacral ala bilaterally, the L5 transverse process at the L4-5  facet level.  And at the L3-4 level the facet capsule was well preserved  and the transverse process at this level, the L4 transverse process,  identified.  Incision was carried laterally, as well, up to the level of  the L2-3 facets, and this in order to allow for exposure of the depth in  this large-sized individual who is morbidly obese.  Following this,  packing the areas and bleeding controlled using mono  and bipolar  electrocautery.  The lateral recesses in the posterolateral region were  packed with sponges.  Sponges were removed and Viper retractor placed. A  small amount of soft tissue and paralumbar muscle debrided in the area  of the transverse processes, extending from L4 to L5 and to the sacral  ala to allow for exposure  of these areas and for bone grafting purposes  to allow for a bed for bone graft.  Attention then turned to  decompression procedure.  Note the C-arm fluoro had identified the L4  and L5 levels.  Leksell rongeur was used to divide the interspinous  ligaments between L5 and S1, and L4 and L5.  Next, curette was used to  carefully debride and to incise the ligamentum flavum off the inferior  ventral aspect of the inferior portion of the L5 lamina bilaterally.  Then using the curette as well as the Leksell rongeur with the spinous  process, the neural arch was then carefully lifted away from the  posterior elements and the areas of attachment of the ligamentum flavum  between L4 and L5 were then resected free from the superior portion of  the neural arch at L5, removing this in its entirety.  Next, Leksell  rongeur was used to remove the inferior 40% of the spinous process at  L4.  Both inferior laminae of L4 were then carefully partially  hemilaminectomized using Leksell rongeur as well as 3- and 4-mm  Kerrisons.  Decompression was then further carried out by removing the  ligamentum flavum at the L4-5 level and at the L5-S1 level, performing  foraminotomy over both S1's nerve roots, then performing a partial  facetectomy of the superior articular process of S1 on the left side,  then a total facetectomy of the right S1 facet, decompressing the right  L5 neural foramen.  At the L4-5 levels, similarly a decompression of the  L5 nerve root was carried out with debridement of the patient's lateral  recess at the entry point to the L5 nerve root and removal of any soft   tissue present.  An abundant amount of scar tissue and hypertrophic scar  associated with the right pars area was present and pressing on the  right L5 nerve root.  This was resected in its entirety, as well as the  reflected portion of ligamentum flavum at the right L5 neural foramen.  The pedicle for S1 was identified at the most inferior aspect of the  resection of the superior articular process of S1 on the right side.  Hockey-stick neuroprobe could be passed on both S1 neural foramina and  the right L5 neural foramina without difficulty.  On the left side, the  L5 neural foramen appeared to be capacious and able to accept the  neuroprobe easily.  Very little decompression of the left L5 neural  foramen was necessary, but was performed, resection of medial portion of  the superior articular process of L4 at the level of the pedicle and  continued inferiorly to the neural foramen and over the L5 nerve root,  decompressing this area.  Partial medial facet resections were performed  at the L4-5 level, removing about 10% of the medial aspect of the facet  at L4-5 on the left side and right side, decompressing lateral recess of  the central portions of the canal at the L4-5 disk space.  Examination  of the disk at L4-5 demonstrated no sign of disk herniation; at L5-S1  also this was noted to be the case.  Patient did have a grade 1  spondylolisthesis present at L5-S1.  The facet on the right side at L4-5  was similarly resected in its entirety by removing inferior articular  process of L4, the superior articular process of L5 at the level of the  superior portion of the L5 pedicle.  Gelfoam  thrombin soak was then  placed in the lateral recesses and in the foramen on the right side at  L4 and at the L5 neural foramen, as well.  Hemostasis obtained.  C-arm  fluoro then brought into the field.  An awl was then placed at the left  L4 and a resection of the transverse process of L4 with the  lateral  aspect of the pedicle of L4.  It was heard to be in correct position and  alignment.  And a handheld pedicle finder was then used to probe the  pedicle at left L4 with convergence present.  A ball tip probe was then  used to probe the pedicle, and appeared to probe the pedicle channel  without difficulty.  Tapping was then performed using a 6.25 tap with a  depth chosen of 45 mm.  A 45 mm x 7.0 screw was then introduced into the  tapped hole in the left L4 level after first using an aspiration trocar  [employer] type bone marrow aspirate equipment to aspirate bone marrow from  the left side L4 pedicle within the vertebral body.  With this, then, a  ball-tip probe again was used to probe the channel within the disk space  through the pedicle in the left L4.  The 7.0 x 45 mm screw was then  introduced after first decorticating the transverse process of L4.  With  screwing the 7.0 screw, it was noted that the screw never obtained  adequate purchase with its insertion.  Rechecking and removing the  screw, then checking with a ball tip probe, it was apparent that the  inferior and lateral cortex of the pedicle had been broached.  There  were felt to be present, but broached such that a screw would not be  able to obtain purchase at this level so that placement of pedicle screw  was abandoned on the left L4 level.  Gelfoam was placed within the  opening into the pedicle and then FloSeal.  This was held in place and  adequate hemostasis obtained.  Attention then turned to placement of a  screw on the left side at the L5 level.  An awl was used to make an  entry point into the lateral aspect of the pedicle at the intersection  of the transverse process of L5 with the superior articular process of  L5 lateral portion of the pedicle.  A handheld pedicle finder, blunt  straight, was then used to make an opening into the pedicle, medullary  canal into the vertebral body, and this was done  without difficulty  measuring 45 mm.  A 7.0 screw was chosen for this level, as well.  Tapped with a 6.25 tap and then screw placed without difficulty, with  the correct degree of convergence and good position and alignment on  lateral view with the C-arm fluoro.  The S1 screw on the left side was  then placed, first using an awl for an entry point at the mid to  inferior one-third of the lateral aspect of the superior articular  process of S1 near the dimple that was around there.  A pedicle probe  was then used to probe the pedicle to 35 mm, and a 7.0 x 35 mm screw was  introduced on the left side at the S1 level, observed to be in excellent  position and alignment on the C-arm fluoro with the correct degree of  convergence.  Note that ball tip probes were used to probe  the pedicle  channels at each level in order to ensure no broaching of cortex at L5  or at S1 prior to the insertion of the screw and finalizing the  insertion of the screw.  Decortication was carried out over the sacral  ala as well as the L5 transverse process on the left side.  These were  then packed with VITOSS matrix material.  This extended from L4's  transverse process to L5's transverse process and then to the sacral ala  on the left side.  Attention then turned to the right side, where  similarly, screws were then placed at the L4, L5 and S1 level with the  ability to perform pedicle screw placement at L4 on the right, first  using an awl for an initial entry point using a C-arm verification.  Then using a blunt tip straight pedicle finder to probe the pedicle  channel, tapping using a 5.5 tap and then placing a 6.25 screw on the  right side at L4, and this was done without difficulty with correct  degree of convergence.  High-speed bur was used to decorticate the  transverse process and VITOSS then placed over this posterolateral  region.  Next, screw was then placed at the L5 level on the right side   similarly, using the awl for an entry point at the intersection of the  transverse process of L5 with the lateral aspect of the pedicle of L5  and the L5 superior articular process region.  This was then probed with  a blunt tip pedicle finder to the correct depth, and a ball tip probe  used to further probe and to ensure no sign of broach of cortex. Tapping with a 6.25 tap, a 7.0 x 45 mm screw was then placed on the  right at the L5 level, obtaining excellent purchase.  The right S1  level, similarly, then, an awl was used to make an entry point at the  mid to lower third area of the lateral aspect of the superior articular  process of S1.  After this was confirmed on C-arm fluoro, then a pedicle  finder was used to probe the pedicle to 35 mm with the correct degree of  convergence.  This was then tapped to a 6.25 tap, again probed with the  ball tip probe.  Appeared to be intact and a 35 mm x 7.0 screw was  placed on the right side at S1, obtaining excellent purchase.  With this  completed, then determination of soft tissue resistance was performed of  the right L4, showed a soft tissue resistance of 25 at L4 levels  bilaterally and S1 levels bilaterally demonstrated soft tissue  resistance greater than 45 at each level.  This completed, then,  attention was turned to performing a transforaminal lumbar interbody  fusion, right L4 and right L5 foramen.  Beginning first at the right L4-  5 level, the L5 nerve root was retracted superiorly, carefully using a  D'Errico, as well as the thecal sac medially.  The disk space  identified, bipolar electrocautery used to control venous bleeding from  epidural veins.  A 15 blade scalpel was used to incise the disk on the  right side at the posterolateral corner.  The disk was then removed  using pituitary rongeurs, then curettage carried out.  Dilatation of the  disk space carried out from an 8-mm to 9-mm, then 10-, then 11- and then  a 12-mm  dilator.  The 12-mm appeared to  give the best fit.  Following  this, then, the disk space was then further debrided of disk material  using a larger debriding device, including pituitary rongeurs, biting  pituitary rongeurs.  Curettage using a straight curette with straight  upbiting to the right, straight upbiting to the left, curettes, as well  as ring curettes, straight and upbiting ring curettes.  Disk space was  further debrided down to bleeding bony endplates, inferior aspect of L4  and superior aspect at L5.  This completed, then, trial of implant was  performed at first with a 10- and then an 11-mm trial.  The 11-mm trial  provided excellent fit and we were unable to distract the disk space  further to accept a 12-mm trial, so an 11-mm trial was chosen as the  permanent implant.  An 11-mm lordotic Concorde cage was brought on the  field.  This was packed with local bone graft that had been harvested  from posterior element bone.  Additional posterior element bone was  packed within the intervertebral disk space and a 10-mm implant trial  was then used to impact the bone to provide space for the cage.  A 23-mm  cage x 11-mm width was then chosen; this was packed appropriately and  then impacted into the disk space at about 35 degrees of angulation to  the midline.  It provided excellent fixation of the disk space and was  retroset beneath the posterior aspect of the disk space at least 2 to 3  mm.  Irrigation was carried out.  The area posterior to the diskectomy  area was carefully evaluated and there were no bone fragments within the  spinal canal present, an L4 nerve root exiting out  within the neural  foramen without difficulty.  Attention then turned to the L5-S1 level on  the right side, where similarly the L5 nerve root was retracted  superiorly and the thecal sac retracted medially, the disk space  evaluated.  A 15 blade scalpel was used to incise the disk on the right   side at the L5-S1 level and pituitary rongeur was then used to debride  the disk of disk material using first straight pituitaries, small, with  these, then dilating the disk space with 8-, 9- and 10-mm dilators.  Unable to insert an 11-mm dilator.  Disk space was then further debrided  of degenerative disk material using pituitary rongeurs, the larger  variety, as well as curettage carried out using straight upbiting, right  upbiting left as well as ring and upbiting ring curettes, debriding the  endplates of cartilage material down to the bleeding bone endplates.  This completed, the disk space was then irrigated with copious amounts  of irrigant solution.  A trial was performed and a 10-mm implant was  chosen.  The 10-mm lordotic Concorde cage was chosen, measuring 23 mm.  This was then packed with bone graft material from local bone grafts  that had been harvested from posterior elements.  Additional local bone  graft was then placed within the intervertebral disk space and impacted  using a 9-mm trial implant.  This completed, then, the permanent implant  was brought onto the field, packed with bone graft appropriately, and  was impacted into place within the disk space at approximately a 35-  degree angle to the midline, crossing the midline.  This was subset well  beneath the posterior aspect of the disk space at L5-S1.  This  completed, the transforaminal lumbar interbody fusion (  TLIF) portion of  the case, careful inspection of the nerve roots demonstrated no  abnormality, no sign of nerve compression, no bone within the spinal  canal at either posterior lumbar interbody fusion area.  Gelfoam  thrombin soak was placed within the lateral recesses on the right side  over the posterior aspect of the diskectomy areas to obtain hemostasis.  A 70-mm  rod was then placed on the right side within the screw fastener  heads after they had been loosened using the head breaker provided.  The   fastener for the screw at the L4 level was then tightened to the rod on  the right side using a torque device at 80 mm foot pounds.  The cap for  the L5 level was then similarly tightened to 80 foot pounds with  compression across the L4-5 level on the right side.  Finally, the cap  at the S1 level on the right side was also compressed and torqued to 80  foot pounds with compression between the L4 and L5 screw head fasteners,  obtaining compression on the right side.  A 35-mm rod was then placed on  the left-sided screw fasteners between L5 and S1, and compression  obtained between the screws after first tightening the L4 cap to 80 foot  pounds and then compressing between L4 and L5 fasteners and tightening  the cap at the S1 level to 80 foot pounds.  An appropriate length  transverse connecting rod was then placed between the L5 and S1  fasteners and attached to each of the bars at 60 foot pounds, and then  the central screw tightened to fix the rod on the left side to the right  side.  This provided extra additional stability.  With this, inspection  of the nerve roots demonstrated no sign of nerve compression on either  side at L4, L5 or S1.  It was felt an adequate decompression and  stabilization had occurred with the fixation provided.  Additional bone  graft was then placed into the region of the pedicle on the left side at  L4 over the posterolateral regions using the remaining local bone graft  as well as VITOSS on the left side and then right side from the  transverse process of L4 to L5 to the sacral ala.  Care was taken to assure that no bone graft had been placed within the spinal canal, and  this was carefully evaluated.  Excess Gelfoam was then removed from the  spinal canal and a small layer placed over the posterior aspect of the  thecal sac extending from L4 to S1.  Medium Hemovac drain placed in the  depth of the incision, exiting over the right lower back.  The   paralumbar muscles were loosely approximated in the midline over the  laminotomy region, the inferior aspect of L4 and then L5 using #1 Vicryl  sutures.  The lumbodorsal fascia reapproximated at the spinous processes  of S1, S2 and at L4, L3 and at L2 using interrupted #1 Vicryl sutures in  a simple figure-of-eight fashion.  Lumbodorsal fascia approximated to  itself in the midline over the central laminotomy region at L5 using  interrupted figure-of-eight simple sutures of #1 Vicryl.  The deep  subcutaneous layer was approximated with interrupted #0 and #1 Vicryl  sutures, the more superficial layers with interrupted 2-0 Vicryl  sutures, the skin closed with running subcutaneous stitch of 4-0 Vicryl.  Tincture of benzoin and Steri-Strips applied, 4 x  4's affixed to the  skin with ABD and Hypafix tape.  Dressing was also applied to the drain  and pink tape applied to keep the drain in place.  Note that  intraoperatively C-arm fluoro was used to evaluate pedicle screw  placement.  It was also used at the end of the case to document pedicle  screw and rod placement in both AP and lateral planes.  Intraoperative  neuromonitoring demonstrated no abnormalities through the case.  The  patient was then returned to a supine position, reactivated, extubated  and returned to the recovery room in satisfactory condition.      Kerrin Champagne, M.D.  Electronically Signed     JEN/MEDQ  D:  05/31/2007  T:  06/01/2007  Job:  [id number]

## 2010-12-13 NOTE — Consult Note (Signed)
NAME:  [name], [name]               ACCOUNT NO.:  [id number]   MEDICAL RECORD NO.:  [id number]          PATIENT TYPE:  OBV   LOCATION:  1526                         FACILITY:  [facility]   PHYSICIAN:  Anselmo Rod, M.D.  DATE OF BIRTH:  Jun 26, 1966   DATE OF CONSULTATION:  01/13/2007  DATE OF DISCHARGE:                                 CONSULTATION   REASON FOR CONSULTATION:  Rectal bleeding.   ASSESSMENT:  1. Rectal bleeding in a 45 year old white male with a history of      sigmoid diverticulosis, rule out recurrent bleed versus      hemorrhoids, arteriovenous malformations, question missed polyp.  2. History of sigmoid diverticulosis diagnosed on a colonoscopy in      October of 2007.  3. Bipolar disorder on Lithium.  4. Morbid obesity.  5. Adult onset diabetes mellitus.  6. Hypertension.  7. Psoriasis.  8. History of soft tissue hip injury in May of 2008.  The patient was      on Meloxicam and Naprosyn when he a similar gastrointestinal bleed.  9. Obstructive sleep apnea on CPAP at home.  10.History of sciatica.   RECOMMENDATIONS:  1. Repeat colonoscopy versus flexible sigmoidoscopy in the a.m.  We      will prep the patient tonight.  2. Clear liquid diet for now.  3. NPO after midnight.  4. Continue serial CBCs.  5. Further recommendations will made after the procedure has been      done.   DISCUSSION:  Mr. [name] is a 45 year old white male with  multiple medical problems listed above.  He had a similar admission on  May 30 through January 01, 2007 when he had rectal bleeding and was brought  to the hospital by EMS as there was an associated syncopal event.  The  patient was treated conservatively and was thought to have a  diverticular bleed and was discharged home after his bleeding stopped.  The bleeding seemed to have stopped spontaneously and no intervention  was undertaken at that time.  The patient was in his usual state of  health until this morning at  about 3 o'clock when he woke up to urinate  and realize he was laying in a puddle of blood and had blood running  down his legs.  He denies any associated nausea, vomiting, diarrhea,  syncope, near syncopal events, dizziness or weakness.  He presented to  the emergency room where he was evaluated.  Hemoglobin was 11.3 grams  per dL and the patient was admitted for observation.  His appetite has  been good.  His weight has been stable.  He denies any nonsteroidal use.  There is no history of associated fever or chills.  The patient denies a  previous history of peptic ulcer disease.  His bowel habits have been  normal until this morning when he had some loose stool.  There is no  known family history of colon cancer or IBD.   ALLERGIES:  DEPAKOTE.   MEDICATIONS:  At home are Lithium, Effexor, Geodon, Seroquel, Metformin,  Risperdal.   SOCIAL HISTORY:  He is married, lives [place] his wife in [place]  He works for a Education officer, environmental, [employer].  He  denies any use of alcohol and street drugs.  He has had Motrin in the  past but does not smoke at the present time.   FAMILY HISTORY:  There is no known family history of colon cancer or  IBD.   REVIEW OF SYSTEMS:  1. Rectal bleeding.  2. No nausea, vomiting, diarrhea or abdominal pain.  3. No fevers, chills or rigors.   PHYSICAL EXAMINATION:  GENERAL:  Reveals a pleasant, cooperative young  male who looks older than his stated age.  He is sitting comfortably on  the side of the bed with temperature of 97 degrees Fahrenheit, blood  pressure 94/60, pulse 99 per minute, respiratory rate 18.  HEENT:  Examination reveals normocephalic and atraumatic head.  Facial  symmetry is preserved.  NECK:  Supple.  No JVD, thyromegaly or lymphadenopathy.  CHEST:  Clear to auscultation.  HEART:  S1-S2 regular.  ABDOMEN:  Obese, nontender with normal bowel sounds.  No  hepatosplenomegaly.  RECTAL:  Deferred.   LABORATORY:   Evaluation revealed a white count of 7.1.  Hemoglobin 11.3  down to 10 grams per dL at about 11 o'clock this morning.  Platelet  count of 273,000.  PT and INR are 13.2 and 1 respectively.  Hemoglobin  A1c is 6%.  Lithium level is normal at 1.09.   PLAN:  As above.  Further recommendation to be made by the [facility] in the morning.      Anselmo Rod, M.D.  Electronically Signed     JNM/MEDQ  D:  01/13/2007  T:  01/14/2007  Job:  [id number]   cc:   Arta Silence, MD  Fax: [phone]   Rachael Fee, MD  [address]  [place], [place] [address]

## 2010-12-16 NOTE — Procedures (Signed)
NAME:  [name], [name] NO.:  [id number]   MEDICAL RECORD NO.:  [id number]          PATIENT TYPE:  OUT   LOCATION:  SLEEP CENTER                 FACILITY:  [facility]   PHYSICIAN:  Marcelyn Bruins, M.D. [facility] DATE OF BIRTH:  1965-10-19   DATE OF STUDY:  12/27/2005                              NOCTURNAL POLYSOMNOGRAM   REFERRING PHYSICIAN:  Dr. Laurita Quint.   INDICATIONS FOR STUDY:  Hypersomnia with sleep apnea.   EPWORTH SCORE:  21.   SLEEP ARCHITECTURE:  The patient had total sleep time of 327 minutes with  significantly decreased REM and never achieved slow wave sleep.  Sleep onset  latency was very rapid at 3 minutes and REM onset was 69 minutes.  Sleep  efficiency was 87%.   RESPIRATORY DATA:  The patient underwent split-night protocol where he was  found to have 182 obstructive events in the first 163 minutes of sleep.  This gave him a respiratory disturbance index of 67 events per hour.  Severe  snoring was noted throughout, and the obstructive events occurred in all  body positions.  By protocol, the patient was placed on a medium ResMed  Ultram Mirage full-face mask, and CPAP titration was initiated.  At a final  pressure 14 cm, there appeared to be adequate control of both obstructive  events and snoring.   OXYGEN DATA:  The patient had O2 desaturation as low as 75% with his  obstructive events.   CARDIAC DATA:  No clinically significant cardiac arrhythmias.   MOVEMENT/PARASOMNIA:  The the patient was found to have 206 leg jerks with 3  per hour resulting in arousal or awakening.   IMPRESSION/RECOMMENDATIONS:  1.  Severe obstructive sleep apnea/hypopnea syndrome with a respiratory      disturbance index of 67 events per hour during the first half of the      night.  The patient was then placed on CPAP and titrated to a final      pressure of 14 cm with what appeared to be fairly good control.  A      medium ResMed Ultram Mirage full-face mask was  used.  2.  Very large numbers of leg jerks with what appears to be significant      sleep disruption.  However, these numbers of leg jerks significantly      decreased as the CPAP pressure was optimized.  More than likely, this is      related to      the patient's sleep disordered breathing, however, the patient continues      to have daytime symptoms despite optimal CPAP.  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         ______________________________  Marcelyn Bruins, M.D. [facility]  Diplomate, American Board of Sleep  Medicine   KC/MEDQ  D:  01/10/2006 06:51:22  T:  01/10/2006 07:23:54  Job:  [id number]

## 2011-01-13 ENCOUNTER — Telehealth: Payer: Self-pay | Admitting: *Deleted

## 2011-01-13 NOTE — Telephone Encounter (Signed)
Noted, I will await further recs.

## 2011-01-13 NOTE — Telephone Encounter (Signed)
Patient called to let you know that he has an appt with his psychologist next week and they will refer him to psychiatry and he will let you know what he finds out about the neurontin.

## 2011-02-09 ENCOUNTER — Other Ambulatory Visit: Payer: Self-pay | Admitting: Specialist

## 2011-02-09 DIAGNOSIS — M545 Low back pain: Secondary | ICD-10-CM

## 2011-03-01 ENCOUNTER — Other Ambulatory Visit: Payer: BC Managed Care – PPO

## 2011-03-03 ENCOUNTER — Other Ambulatory Visit: Payer: Self-pay | Admitting: Family Medicine

## 2011-03-03 DIAGNOSIS — E119 Type 2 diabetes mellitus without complications: Secondary | ICD-10-CM

## 2011-03-09 ENCOUNTER — Other Ambulatory Visit (INDEPENDENT_AMBULATORY_CARE_PROVIDER_SITE_OTHER): Payer: BC Managed Care – PPO | Admitting: Family Medicine

## 2011-03-09 DIAGNOSIS — E119 Type 2 diabetes mellitus without complications: Secondary | ICD-10-CM

## 2011-03-09 LAB — LIPID PANEL
HDL: 47.8 mg/dL (ref >39.00)
VLDL: 56.6 mg/dL — ABNORMAL HIGH (ref 0.0–40.0)

## 2011-03-09 LAB — COMPREHENSIVE METABOLIC PANEL
ALT: 47 U/L (ref 0–53)
AST: 34 U/L (ref 0–37)
Chloride: 104 mEq/L (ref 96–112)
Creatinine, Ser: 1 mg/dL (ref 0.4–1.5)
Total Bilirubin: 0.6 mg/dL (ref 0.3–1.2)

## 2011-03-09 LAB — LDL CHOLESTEROL, DIRECT: Direct LDL: 130.2 mg/dL

## 2011-03-09 LAB — HEMOGLOBIN A1C: Hgb A1c MFr Bld: 6.8 % — ABNORMAL HIGH (ref 4.6–6.5)

## 2011-03-14 ENCOUNTER — Encounter: Payer: Self-pay | Admitting: Family Medicine

## 2011-03-14 ENCOUNTER — Ambulatory Visit (INDEPENDENT_AMBULATORY_CARE_PROVIDER_SITE_OTHER): Payer: BC Managed Care – PPO | Admitting: Family Medicine

## 2011-03-14 VITALS — BP 122/80 | HR 80 | Temp 98.2°F | Ht 69.0 in | Wt 271.8 lb

## 2011-03-14 DIAGNOSIS — E785 Hyperlipidemia, unspecified: Secondary | ICD-10-CM

## 2011-03-14 DIAGNOSIS — F259 Schizoaffective disorder, unspecified: Secondary | ICD-10-CM

## 2011-03-14 DIAGNOSIS — F17200 Nicotine dependence, unspecified, uncomplicated: Secondary | ICD-10-CM

## 2011-03-14 DIAGNOSIS — E119 Type 2 diabetes mellitus without complications: Secondary | ICD-10-CM

## 2011-03-14 DIAGNOSIS — I10 Essential (primary) hypertension: Secondary | ICD-10-CM

## 2011-03-14 NOTE — Progress Notes (Signed)
Divorce is pending, should be final in next few months.  "The pressure is off, and that has helped."    Smoking less now.  Is trying to quit totally.  Is in counseling and is going to work on psych f/u.  Mood is okay, in spite of being off seroquel.    Diabetes:  Using medications without difficulties:yes Hypoglycemic episodes:no Hyperglycemic episodes:no Feet problems:no Blood Sugars averaging:90-140 Foot pain (likely neuropathy) is better with less burning sugar has decreased.    No SI and we talked about him getting f/u with psych. Is in counseling.    PMH and SH reviewed  Meds, vitals, and allergies reviewed.   ROS: See HPI.  Otherwise negative.    GEN: nad, alert and oriented, overweight.  Speech is slightly pressured but he can have a typical conversation.  Judgement appears intact HEENT: mucous membranes moist NECK: supple w/o LA CV: rrr. PULM: ctab, no inc wob ABD: soft, +bs EXT: no edema SKIN: no acute rash  Diabetic foot exam: Normal inspection No skin breakdown No calluses  Normal DP pulses Mild dec in sensation to light touch and monofilament on R foot Nails normal

## 2011-03-14 NOTE — Patient Instructions (Addendum)
I want you to call about getting set up with psychiatry.   I want to recheck your A1c in 4 or 5 months with a OV a few days later.  Don't change your meds for now.

## 2011-03-15 ENCOUNTER — Encounter: Payer: Self-pay | Admitting: Family Medicine

## 2011-03-15 ENCOUNTER — Ambulatory Visit
Admission: RE | Admit: 2011-03-15 | Discharge: 2011-03-15 | Disposition: A | Discharge status: Home / Self Care | Payer: BC Managed Care – PPO | Source: Ambulatory Visit | Attending: Specialist | Admitting: Specialist

## 2011-03-15 DIAGNOSIS — M545 Low back pain: Secondary | ICD-10-CM

## 2011-03-15 NOTE — Assessment & Plan Note (Signed)
Labs d/w pt.  Lipids much improved.  No change in meds.

## 2011-03-15 NOTE — Assessment & Plan Note (Signed)
He'll schedule f/u with psych.  No SI/HI, okay for outpatient f/u.

## 2011-03-15 NOTE — Assessment & Plan Note (Signed)
He's cutting down.  Encouraged cessation.

## 2011-03-15 NOTE — Assessment & Plan Note (Signed)
A1c okay, d/w pt about weight.  No change in meds.

## 2011-03-15 NOTE — Assessment & Plan Note (Signed)
BP controlled,  ?-continue current meds  ?

## 2011-05-09 LAB — BASIC METABOLIC PANEL
BUN: 12
BUN: 8
CO2: 26
CO2: 27
Calcium: 8.5
Chloride: 108
Chloride: 109
Creatinine, Ser: 0.96
GFR calc Af Amer: >60
GFR calc non Af Amer: >60
Glucose, Bld: 111 — ABNORMAL HIGH
Glucose, Bld: 132 — ABNORMAL HIGH
Glucose, Bld: 151 — ABNORMAL HIGH
Potassium: 3.2 — ABNORMAL LOW
Potassium: 3.5
Potassium: 3.5
Sodium: 140

## 2011-05-09 LAB — CBC
HCT: 29.9 — ABNORMAL LOW
Hemoglobin: 10.1 — ABNORMAL LOW
MCHC: 33.8
RBC: 3.31 — ABNORMAL LOW
RDW: 14.6 — ABNORMAL HIGH

## 2011-05-10 LAB — URINE CULTURE
Colony Count: NO GROWTH
Culture: NO GROWTH

## 2011-05-10 LAB — POCT I-STAT 4, (NA,K, GLUC, HGB,HCT)
Glucose, Bld: 165 — ABNORMAL HIGH
Glucose, Bld: 169 — ABNORMAL HIGH
Glucose, Bld: 200 — ABNORMAL HIGH
HCT: 28 — ABNORMAL LOW
HCT: 36 — ABNORMAL LOW
Hemoglobin: 12.2 — ABNORMAL LOW
Hemoglobin: 9.5 — ABNORMAL LOW
Operator id: 140821
Potassium: 6.1 — ABNORMAL HIGH
Potassium: 6.2 — ABNORMAL HIGH
Sodium: 135

## 2011-05-10 LAB — HEMOGLOBIN AND HEMATOCRIT, BLOOD
HCT: 29 — ABNORMAL LOW
Hemoglobin: 10.1 — ABNORMAL LOW
Hemoglobin: 9.9 — ABNORMAL LOW

## 2011-05-10 LAB — COMPREHENSIVE METABOLIC PANEL
ALT: 110 — ABNORMAL HIGH
ALT: 123 — ABNORMAL HIGH
AST: 138 — ABNORMAL HIGH
Albumin: 4.6
Alkaline Phosphatase: 103
Alkaline Phosphatase: 76
Alkaline Phosphatase: 98
BUN: 31 — ABNORMAL HIGH
CO2: 25
Calcium: 8 — ABNORMAL LOW
Chloride: 103
GFR calc Af Amer: 42 — ABNORMAL LOW
GFR calc non Af Amer: 35 — ABNORMAL LOW
Glucose, Bld: 137 — ABNORMAL HIGH
Glucose, Bld: 163 — ABNORMAL HIGH
Potassium: 3.8
Potassium: 4.3
Potassium: 4.6
Sodium: 135
Sodium: 137
Total Bilirubin: 0.9
Total Protein: 7.5
Total Protein: 8.5 — ABNORMAL HIGH

## 2011-05-10 LAB — BASIC METABOLIC PANEL
BUN: 18
BUN: 24 — ABNORMAL HIGH
BUN: 28 — ABNORMAL HIGH
CO2: 21
CO2: 27
CO2: 27
Calcium: 8.3 — ABNORMAL LOW
Chloride: 103
Chloride: 105
Chloride: 109
Creatinine, Ser: 1.39
Creatinine, Ser: 2.05 — ABNORMAL HIGH
GFR calc Af Amer: 56 — ABNORMAL LOW
GFR calc Af Amer: >60
GFR calc Af Amer: >60
Glucose, Bld: 214 — ABNORMAL HIGH
Potassium: 3.8
Potassium: 4.6
Sodium: 135

## 2011-05-10 LAB — CBC
HCT: 25.3 — ABNORMAL LOW
Hemoglobin: 11.6 — ABNORMAL LOW
Hemoglobin: 14.7
Hemoglobin: 9.4 — ABNORMAL LOW
MCHC: 33.7
MCHC: 33.8
MCHC: 35
MCV: 88.6
MCV: 90.7
Platelets: 318
RBC: 2.79 — ABNORMAL LOW
RBC: 3.04 — ABNORMAL LOW
RBC: 3.89 — ABNORMAL LOW
RDW: 14
WBC: 10.4
WBC: 12.3 — ABNORMAL HIGH
WBC: 16.4 — ABNORMAL HIGH

## 2011-05-10 LAB — URINALYSIS, ROUTINE W REFLEX MICROSCOPIC
Bilirubin Urine: NEGATIVE
Glucose, UA: NEGATIVE
Ketones, ur: NEGATIVE
Leukocytes, UA: NEGATIVE
Nitrite: NEGATIVE
Protein, ur: 30 — AB
Specific Gravity, Urine: 1.01
Urobilinogen, UA: 0.2
pH: 6

## 2011-05-10 LAB — CULTURE, BLOOD (ROUTINE X 2): Culture: NO GROWTH

## 2011-05-10 LAB — TYPE AND SCREEN: Antibody Screen: NEGATIVE

## 2011-05-10 LAB — URINE MICROSCOPIC-ADD ON

## 2011-05-10 LAB — DIFFERENTIAL
Basophils Relative: 1
Eosinophils Absolute: 0.3
Eosinophils Relative: 3
Monocytes Absolute: 0.6
Monocytes Relative: 6

## 2011-05-10 LAB — BILIRUBIN, DIRECT: Bilirubin, Direct: <0.1

## 2011-05-10 LAB — CROSSMATCH

## 2011-05-10 LAB — HEMOGLOBIN A1C: Mean Plasma Glucose: 186

## 2011-05-16 LAB — HEPATIC FUNCTION PANEL
ALT: 51
Alkaline Phosphatase: 68
Bilirubin, Direct: <0.1

## 2011-05-16 LAB — URINALYSIS, ROUTINE W REFLEX MICROSCOPIC
Bilirubin Urine: NEGATIVE
Hgb urine dipstick: NEGATIVE
Ketones, ur: NEGATIVE
Specific Gravity, Urine: 1.015
Urobilinogen, UA: 0.2

## 2011-05-16 LAB — CBC
HCT: 35.7 — ABNORMAL LOW
Hemoglobin: 12.4 — ABNORMAL LOW
MCHC: 34.7
MCV: 88.6
RBC: 4.02 — ABNORMAL LOW

## 2011-05-16 LAB — BASIC METABOLIC PANEL
CO2: 26
CO2: 23
Calcium: 9.9
Chloride: 100
Creatinine, Ser: 1.22
GFR calc Af Amer: >60
GFR calc Af Amer: >60
Glucose, Bld: 149 — ABNORMAL HIGH
Potassium: 3.3 — ABNORMAL LOW
Sodium: 137

## 2011-05-16 LAB — DIFFERENTIAL
Basophils Relative: 1
Eosinophils Absolute: 0.2
Eosinophils Relative: 1
Monocytes Absolute: 1 — ABNORMAL HIGH
Monocytes Relative: 6
Neutro Abs: 12.9 — ABNORMAL HIGH

## 2011-05-16 LAB — RAPID URINE DRUG SCREEN, HOSP PERFORMED
Amphetamines: NOT DETECTED
Cocaine: NOT DETECTED
Opiates: POSITIVE — AB
Tetrahydrocannabinol: NOT DETECTED

## 2011-05-17 LAB — CBC
HCT: 17.1 — ABNORMAL LOW
HCT: 24 — ABNORMAL LOW
Hemoglobin: 6 — CL
Hemoglobin: 8.5 — ABNORMAL LOW
Platelets: 263
Platelets: 273
RDW: 13.5
RDW: 13.6
RDW: 13.9
WBC: 10.4
WBC: 7.2

## 2011-05-17 LAB — BASIC METABOLIC PANEL
Chloride: 108
Creatinine, Ser: 1.14
GFR calc Af Amer: >60
GFR calc non Af Amer: >60

## 2011-05-17 LAB — TYPE AND SCREEN
ABO/RH(D): A POS
Antibody Screen: NEGATIVE

## 2011-05-17 LAB — COMPREHENSIVE METABOLIC PANEL
AST: 21
Albumin: 2.9 — ABNORMAL LOW
Alkaline Phosphatase: 46
Chloride: 112
Creatinine, Ser: 1.06
GFR calc Af Amer: >60
Potassium: 3.2 — ABNORMAL LOW
Total Bilirubin: 0.5

## 2011-05-17 LAB — PROTIME-INR
INR: 1
Prothrombin Time: 13.2
Prothrombin Time: 13.6

## 2011-05-17 LAB — HEMOGLOBIN AND HEMATOCRIT, BLOOD
HCT: 22.2 — ABNORMAL LOW
HCT: 26.1 — ABNORMAL LOW
HCT: 26.6 — ABNORMAL LOW
HCT: 28.1 — ABNORMAL LOW
HCT: 29.1 — ABNORMAL LOW
HCT: 29.6 — ABNORMAL LOW
Hemoglobin: 10 — ABNORMAL LOW
Hemoglobin: 10 — ABNORMAL LOW
Hemoglobin: 9 — ABNORMAL LOW
Hemoglobin: 9.8 — ABNORMAL LOW
Hemoglobin: 9.8 — ABNORMAL LOW

## 2011-05-17 LAB — LITHIUM LEVEL: Lithium Lvl: 1.09

## 2011-05-17 LAB — PREPARE RBC (CROSSMATCH)

## 2011-05-17 LAB — DIFFERENTIAL
Basophils Absolute: 0
Lymphocytes Relative: 20
Neutro Abs: 5

## 2011-05-18 LAB — BASIC METABOLIC PANEL
BUN: 5 — ABNORMAL LOW
CO2: 28
Calcium: 9.2
Creatinine, Ser: 1.07
GFR calc Af Amer: >60
Glucose, Bld: 136 — ABNORMAL HIGH

## 2011-05-18 LAB — CBC
HCT: 24.1 — ABNORMAL LOW
Hemoglobin: 8.2 — ABNORMAL LOW
Platelets: 237
Platelets: 239
RBC: 2.6 — ABNORMAL LOW
RDW: 13.4
WBC: 8.2
WBC: 8.3

## 2011-05-18 LAB — HEMOGLOBIN AND HEMATOCRIT, BLOOD
HCT: 25.6 — ABNORMAL LOW
HCT: 27.9 — ABNORMAL LOW
Hemoglobin: 8.6 — ABNORMAL LOW
Hemoglobin: 9.6 — ABNORMAL LOW

## 2011-07-14 ENCOUNTER — Other Ambulatory Visit: Payer: Self-pay | Admitting: *Deleted

## 2011-07-14 MED ORDER — INSULIN GLARGINE 100 UNIT/ML ~~LOC~~ SOLN: 160.0000 [IU] | Freq: Every day | SUBCUTANEOUS | Status: DC

## 2011-08-08 ENCOUNTER — Emergency Department: Payer: Self-pay | Admitting: Emergency Medicine

## 2011-08-11 ENCOUNTER — Other Ambulatory Visit: Payer: BC Managed Care – PPO

## 2011-08-18 ENCOUNTER — Ambulatory Visit: Payer: BC Managed Care – PPO | Admitting: Family Medicine

## 2012-01-05 LAB — COMPREHENSIVE METABOLIC PANEL
Albumin: 4.1 g/dL (ref 3.4–5.0)
Anion Gap: 12 (ref 7–16)
BUN: 14 mg/dL (ref 7–18)
Calcium, Total: 9.3 mg/dL (ref 8.5–10.1)
Co2: 23 mmol/L (ref 21–32)
EGFR (African American): >60
EGFR (Non-African Amer.): >60
Glucose: 116 mg/dL — ABNORMAL HIGH (ref 65–99)
Osmolality: 279 (ref 275–301)
Potassium: 3.6 mmol/L (ref 3.5–5.1)
SGOT(AST): 29 U/L (ref 15–37)
SGPT (ALT): 39 U/L
Sodium: 139 mmol/L (ref 136–145)
Total Protein: 8.2 g/dL (ref 6.4–8.2)

## 2012-01-05 LAB — ETHANOL: Ethanol: 11 mg/dL

## 2012-01-05 LAB — URINALYSIS, COMPLETE
Bacteria: NONE SEEN
Bilirubin,UR: NEGATIVE
Glucose,UR: NEGATIVE mg/dL (ref 0–75)
Ketone: NEGATIVE
Nitrite: NEGATIVE
Ph: 6 (ref 4.5–8.0)
Protein: NEGATIVE

## 2012-01-05 LAB — CBC
HCT: 45.5 % (ref 40.0–52.0)
HGB: 15.3 g/dL (ref 13.0–18.0)
MCHC: 33.6 g/dL (ref 32.0–36.0)
Platelet: 271 10*3/uL (ref 150–440)
RBC: 4.92 10*6/uL (ref 4.40–5.90)

## 2012-01-05 LAB — DRUG SCREEN, URINE
Barbiturates, Ur Screen: NEGATIVE (ref ?–200)
Benzodiazepine, Ur Scrn: NEGATIVE (ref ?–200)
Cannabinoid 50 Ng, Ur ~~LOC~~: NEGATIVE (ref ?–50)
Cocaine Metabolite,Ur ~~LOC~~: NEGATIVE (ref ?–300)
Methadone, Ur Screen: NEGATIVE (ref ?–300)
Tricyclic, Ur Screen: NEGATIVE (ref ?–1000)

## 2012-01-05 LAB — SALICYLATE LEVEL: Salicylates, Serum: 3.2 mg/dL — ABNORMAL HIGH

## 2012-01-06 ENCOUNTER — Inpatient Hospital Stay: Payer: Self-pay | Admitting: Psychiatry

## 2012-01-09 LAB — LIPID PANEL
Cholesterol: 208 mg/dL — ABNORMAL HIGH (ref 0–200)
HDL Cholesterol: 37 mg/dL — ABNORMAL LOW (ref 40–60)

## 2012-01-15 ENCOUNTER — Encounter: Payer: Self-pay | Admitting: Family Medicine

## 2012-01-16 ENCOUNTER — Ambulatory Visit: Payer: BC Managed Care – PPO | Admitting: Family Medicine

## 2012-01-24 ENCOUNTER — Ambulatory Visit: Payer: BC Managed Care – PPO | Admitting: Family Medicine

## 2012-02-06 ENCOUNTER — Ambulatory Visit: Payer: BC Managed Care – PPO | Admitting: Family Medicine

## 2012-02-20 ENCOUNTER — Ambulatory Visit: Payer: BC Managed Care – PPO | Admitting: Family Medicine

## 2012-02-23 ENCOUNTER — Emergency Department: Payer: Self-pay | Admitting: Emergency Medicine

## 2012-02-23 LAB — CBC
MCH: 32.5 pg (ref 26.0–34.0)
MCHC: 35.2 g/dL (ref 32.0–36.0)
Platelet: 255 10*3/uL (ref 150–440)
RDW: 13.3 % (ref 11.5–14.5)

## 2012-02-23 LAB — DRUG SCREEN, URINE
Barbiturates, Ur Screen: NEGATIVE (ref ?–200)
Benzodiazepine, Ur Scrn: NEGATIVE (ref ?–200)
Cannabinoid 50 Ng, Ur ~~LOC~~: NEGATIVE (ref ?–50)
Cocaine Metabolite,Ur ~~LOC~~: NEGATIVE (ref ?–300)
Methadone, Ur Screen: NEGATIVE (ref ?–300)
Tricyclic, Ur Screen: NEGATIVE (ref ?–1000)

## 2012-02-23 LAB — COMPREHENSIVE METABOLIC PANEL
Anion Gap: 11 (ref 7–16)
BUN: 20 mg/dL — ABNORMAL HIGH (ref 7–18)
Bilirubin,Total: 0.2 mg/dL (ref 0.2–1.0)
Chloride: 105 mmol/L (ref 98–107)
EGFR (African American): >60
EGFR (Non-African Amer.): >60
Glucose: 209 mg/dL — ABNORMAL HIGH (ref 65–99)
Osmolality: 290 (ref 275–301)
Potassium: 3.5 mmol/L (ref 3.5–5.1)
SGOT(AST): 38 U/L — ABNORMAL HIGH (ref 15–37)
SGPT (ALT): 48 U/L
Sodium: 141 mmol/L (ref 136–145)
Total Protein: 7.8 g/dL (ref 6.4–8.2)

## 2012-02-23 LAB — TSH: Thyroid Stimulating Horm: 2.59 u[IU]/mL

## 2012-02-23 LAB — ACETAMINOPHEN LEVEL: Acetaminophen: <2 ug/mL

## 2012-02-23 LAB — ETHANOL: Ethanol: 50 mg/dL

## 2012-02-23 LAB — LITHIUM LEVEL: Lithium: 0.23 mmol/L — ABNORMAL LOW

## 2012-08-06 NOTE — Progress Notes (Signed)
This encounter was created in error - please disregard.

## 2012-09-10 ENCOUNTER — Ambulatory Visit: Payer: BC Managed Care – PPO | Admitting: Family Medicine

## 2012-09-10 ENCOUNTER — Ambulatory Visit (INDEPENDENT_AMBULATORY_CARE_PROVIDER_SITE_OTHER): Payer: Medicare Other | Admitting: Family Medicine

## 2012-09-10 ENCOUNTER — Other Ambulatory Visit: Payer: Self-pay | Admitting: *Deleted

## 2012-09-10 ENCOUNTER — Encounter: Payer: Self-pay | Admitting: Family Medicine

## 2012-09-10 VITALS — BP 126/80 | HR 96 | Temp 98.4°F | Wt 243.0 lb

## 2012-09-10 DIAGNOSIS — F259 Schizoaffective disorder, unspecified: Secondary | ICD-10-CM

## 2012-09-10 DIAGNOSIS — E119 Type 2 diabetes mellitus without complications: Secondary | ICD-10-CM

## 2012-09-10 LAB — COMPREHENSIVE METABOLIC PANEL
ALT: 36 U/L (ref 0–53)
AST: 24 U/L (ref 0–37)
Albumin: 3.6 g/dL (ref 3.5–5.2)
Alkaline Phosphatase: 141 U/L — ABNORMAL HIGH (ref 39–117)
BUN: 11 mg/dL (ref 6–23)
CO2: 27 mEq/L (ref 19–32)
Calcium: 8.9 mg/dL (ref 8.4–10.5)
Chloride: 98 mEq/L (ref 96–112)
Creatinine, Ser: 1.2 mg/dL (ref 0.4–1.5)
GFR: 67.22 mL/min (ref >60.00)
Glucose, Bld: 503 mg/dL (ref 70–99)
Potassium: 4 mEq/L (ref 3.5–5.1)
Sodium: 132 mEq/L — ABNORMAL LOW (ref 135–145)
Total Bilirubin: 0.5 mg/dL (ref 0.3–1.2)
Total Protein: 6.8 g/dL (ref 6.0–8.3)

## 2012-09-10 LAB — LIPID PANEL
Cholesterol: 199 mg/dL (ref 0–200)
HDL: 33.7 mg/dL — ABNORMAL LOW (ref >39.00)
Total CHOL/HDL Ratio: 6
Triglycerides: 497 mg/dL — ABNORMAL HIGH (ref 0.0–149.0)
VLDL: 99.4 mg/dL — ABNORMAL HIGH (ref 0.0–40.0)

## 2012-09-10 LAB — HEMOGLOBIN A1C: Hgb A1c MFr Bld: 14.2 % — ABNORMAL HIGH (ref 4.6–6.5)

## 2012-09-10 MED ORDER — LANCETS 30G MISC: Status: DC

## 2012-09-10 MED ORDER — INSULIN GLARGINE 100 UNIT/ML ~~LOC~~ SOLN: SUBCUTANEOUS | Status: DC

## 2012-09-10 MED ORDER — GLUCOSE BLOOD VI STRP: ORAL_STRIP | Status: DC

## 2012-09-10 MED ORDER — FREESTYLE SYSTEM KIT: PACK | Status: DC

## 2012-09-10 MED ORDER — METFORMIN HCL 1000 MG PO TABS: ORAL_TABLET | ORAL | Status: DC

## 2012-09-10 NOTE — Telephone Encounter (Signed)
Please look at the [facility] Rx and let me know if this is correct.  The quantity is not showing a box of 5 pens.  Please advise or send to pharmacy and I will contact the patient.

## 2012-09-10 NOTE — Telephone Encounter (Signed)
Call report with critical lab. Glucose was 503. Results not available in emr yet.

## 2012-09-10 NOTE — Telephone Encounter (Signed)
rxs sent.  Changed to pens.

## 2012-09-10 NOTE — Telephone Encounter (Signed)
Pt left v/m requesting call back [phone]

## 2012-09-10 NOTE — Telephone Encounter (Signed)
Patient advised.  Patient also asks that we send a script for the pen needles, glucometer, lancets and test strips.

## 2012-09-10 NOTE — Assessment & Plan Note (Signed)
Off all meds.  We'll recheck labs today and go from there.  We'll be in contact with patient at that point.  >25 min spent with face to face with patient, >50% counseling and/or coordinating care.

## 2012-09-10 NOTE — Telephone Encounter (Signed)
Please send 100 of each with 3rf.  Thanks.

## 2012-09-10 NOTE — Patient Instructions (Addendum)
Go to the lab on the way out.  We'll contact you with your lab report. We'll make plans about meds at that point.  Take care.
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
## 2012-09-10 NOTE — Telephone Encounter (Signed)
His A1c is 14.  This is a chronic issue that we'll address through meds.  He was not fasting this AM.   Please call pt about A1c elevation.   Needs to restart metformin and lantus.   Please send rx for metformin (1000mg  a day for 10 days then 1000mg  bid, #180, 3rf) and solostart pens (start with 10 units today, 20 units tomorrow, increase by 10 units a day until he gets to either 100 units a day or his sugar in AM is <200; 1 box of 5 pens.  5rf.).   Schedule repeat OV next week to go over his sugars.  Thanks.

## 2012-09-10 NOTE — Telephone Encounter (Signed)
LMOVM that Rx's were sent to pharmacy and to return call for instruction review.

## 2012-09-10 NOTE — Assessment & Plan Note (Addendum)
Per psych. He has no SI/HI.  Okay for outpatient f/u with psych.  He is trying to get voc rehab and try to get a part time job.

## 2012-09-10 NOTE — Telephone Encounter (Signed)
Pt left v/m has spoken with [employer]; glucometer and test strips are covered 100% no matter which brand you choose;please send glucometer,strips,lancets and pen needles to [employer].

## 2012-09-19 ENCOUNTER — Telehealth: Payer: Self-pay

## 2012-09-19 MED ORDER — INSULIN PEN NEEDLE 31G X 5 MM MISC: Status: DC

## 2012-09-19 NOTE — Telephone Encounter (Signed)
Pt request pen needles for lantus solostar pen; spoke with Huntley Dec at [facility] and she said 31 G x 5 mm would be choice for pen needles. Pt said he was to notify Dr Para March when BS neared 200.Pt's  FBS was 218 today; pt is presently taking Metformin 1000 mg daily and today took Lantus Solostar 70 units and plans to take 80 units 09/20/12. Pt said he will make appt with Dr Para March after making payment as soon as he can.Please advise.

## 2012-09-20 ENCOUNTER — Other Ambulatory Visit: Payer: Self-pay | Admitting: *Deleted

## 2012-09-20 MED ORDER — INSULIN PEN NEEDLE 31G X 5 MM MISC: Status: DC

## 2012-09-20 NOTE — Telephone Encounter (Signed)
Patient advised.

## 2012-09-20 NOTE — Telephone Encounter (Signed)
Please call/send in 31 G x 5 mm pen needles, #100, 3rf.   Have him update [place] with readings and insulin dosing next week.  Thanks.

## 2012-09-20 NOTE — Telephone Encounter (Signed)
Rx sent to pharmacy   

## 2012-10-11 ENCOUNTER — Other Ambulatory Visit: Payer: Self-pay | Admitting: *Deleted

## 2012-10-11 MED ORDER — INSULIN GLARGINE 100 UNIT/ML ~~LOC~~ SOLN: 150.0000 [IU] | Freq: Every day | SUBCUTANEOUS | Status: DC

## 2012-10-11 NOTE — Telephone Encounter (Signed)
Sent.  We'll need to recheck A1c in early 5/14 before an OV.  Thanks.

## 2012-10-11 NOTE — Telephone Encounter (Signed)
Pt calling asking for an increase in his solostar pens, pt states he's now taking 150units daily and he was getting 5 pens and that's not enough, pt states BS ranging 94-155, pt uses [employer] [facility]

## 2012-10-14 NOTE — Telephone Encounter (Signed)
Patient advised.  Will call in tomorrow to set up lab and OV in early May.

## 2012-11-04 ENCOUNTER — Telehealth: Payer: Self-pay

## 2012-11-04 NOTE — Telephone Encounter (Signed)
Pt leftv/m returning Lugene's call; request call to [place] at [facility] [phone] re; prescription; transferred v/m on Lugene's phone.

## 2012-11-04 NOTE — Telephone Encounter (Signed)
Pt left v/m; pt has to re enroll in Medicare extra help prescription drug plan; mailed form yesterday and request Lantus solostar samples if available.Please advise.

## 2012-11-04 NOTE — Telephone Encounter (Signed)
Patient keeps saying that there is a "B Script" that he has had filled previously.  I told him that I had never heard of anything like that and that he should call his pharmacist to see if they could let me know what we need to write for.  See note below.  I phoned Colleen at [facility] and she said she didn't know of any such thing either and that it all boils down to his neglecting to fill out the paperwork and get it sent out in a timely fashion.  She says she covered his copay once "personally" so that he wouldn't have to go without his insulin but that she cannot continue to do that.  She said that she is very concerned because with taking as much insulin as he is taking, it could be life-changing for him to be without it.  She says he also needs to get 3 vials so that it will last a month because he has been getting one vial that only lasts 10 days.  If he had been able to get 3 vials last time, he would have had enough to last until the end of the month.

## 2012-11-04 NOTE — Telephone Encounter (Signed)
No samples are available.  I've looked in both refrigerators.

## 2012-11-05 ENCOUNTER — Other Ambulatory Visit: Payer: Self-pay | Admitting: *Deleted

## 2012-11-05 MED ORDER — INSULIN GLARGINE 100 UNIT/ML ~~LOC~~ SOLN: SUBCUTANEOUS | Status: DC

## 2012-11-05 NOTE — Telephone Encounter (Signed)
Please send rx for the same sig with the lantus, but with 3 vials. Thanks.

## 2012-11-05 NOTE — Telephone Encounter (Signed)
I don't know about the B script.  The only option I can see is to send the Rx for 3 vials and have him get it at the pharmacy.

## 2012-11-26 ENCOUNTER — Telehealth: Payer: Self-pay

## 2012-11-26 NOTE — Telephone Encounter (Signed)
Pt left v/m; pt is not eligible for extra help with medicine this year. Pt request substitution of Humulog or Humulin insulin for Lantus Solostar to [facility]. Pt said Humulog or Humulin would be no cost to the pt.Please advise.Pt request call back.

## 2012-11-27 NOTE — Telephone Encounter (Signed)
We can change the insulin, but it needs to be at an OV so I can check his sugar log (bring in sugar log!).  If we have any samples please give to patient and schedule 30 min visit.

## 2012-11-28 NOTE — Telephone Encounter (Signed)
Patient advised. Appt scheduled. 

## 2012-12-06 ENCOUNTER — Ambulatory Visit: Payer: Medicare Other | Admitting: Family Medicine

## 2012-12-20 ENCOUNTER — Ambulatory Visit: Payer: Medicare Other | Admitting: Family Medicine

## 2012-12-20 DIAGNOSIS — Z0289 Encounter for other administrative examinations: Secondary | ICD-10-CM

## 2013-03-20 ENCOUNTER — Ambulatory Visit (INDEPENDENT_AMBULATORY_CARE_PROVIDER_SITE_OTHER): Payer: Medicare Other | Admitting: Family Medicine

## 2013-03-20 ENCOUNTER — Encounter: Payer: Self-pay | Admitting: Family Medicine

## 2013-03-20 VITALS — BP 162/98 | HR 88 | Temp 98.3°F | Ht 70.0 in | Wt 249.8 lb

## 2013-03-20 DIAGNOSIS — F319 Bipolar disorder, unspecified: Secondary | ICD-10-CM

## 2013-03-20 DIAGNOSIS — Z23 Encounter for immunization: Secondary | ICD-10-CM

## 2013-03-20 DIAGNOSIS — E119 Type 2 diabetes mellitus without complications: Secondary | ICD-10-CM

## 2013-03-20 MED ORDER — CEPHALEXIN 500 MG PO CAPS: 500.0000 mg | ORAL_CAPSULE | Freq: Three times a day (TID) | ORAL | Status: DC

## 2013-03-20 NOTE — Patient Instructions (Signed)
Go to the lab on the way out.  We'll contact you with your lab report. Come back for the TB skin test.  Get the form filled out by the psych clinic.  Get back on the insulin.  I would get a flu shot each fall.   Start the keflex for the skin spots.

## 2013-03-21 NOTE — Progress Notes (Signed)
CPE was tabled due to other concerns.   Needs work forms done.  He'll have to have a copy done per psych clinic for BAD.  Discussed and he agrees.  No SI/HI.   Diabetes:  Using medications without difficulties: off meds currently, will restart Hypoglycemic episodes:no Hyperglycemic episodes:likely Feet problems:no Blood Sugars averaging: not checked recently  Due for Tdap.   Likely folliculitis, regional areas with irritation and itching, around hair follicles.  Chronic issue for patient.    PMH and SH reviewed  Meds, vitals, and allergies reviewed.   ROS: See HPI.  Otherwise negative.    GEN: nad, alert and oriented HEENT: mucous membranes moist NECK: supple w/o LA CV: rrr. PULM: ctab, no inc wob ABD: soft, +bs EXT: no edema SKIN: no acute rash but chronic irritation w/o fluctuant mass noted on the shins, around hair follicles.   Diabetic foot exam: Normal inspection No skin breakdown No calluses  Normal DP pulses Normal sensation to light touch and monofilament Nails normal

## 2013-03-21 NOTE — Assessment & Plan Note (Signed)
Per psych, will need work forms done via psych clinic.

## 2013-03-21 NOTE — Assessment & Plan Note (Signed)
See notes on labs. Discussed med restart.

## 2013-03-25 ENCOUNTER — Ambulatory Visit: Payer: Medicare Other

## 2013-03-25 ENCOUNTER — Ambulatory Visit (INDEPENDENT_AMBULATORY_CARE_PROVIDER_SITE_OTHER): Payer: Medicare Other | Admitting: *Deleted

## 2013-03-25 DIAGNOSIS — Z029 Encounter for administrative examinations, unspecified: Secondary | ICD-10-CM

## 2013-03-27 LAB — TB SKIN TEST: TB Skin Test: NEGATIVE

## 2013-04-10 ENCOUNTER — Emergency Department: Payer: Self-pay | Admitting: Emergency Medicine

## 2013-04-11 ENCOUNTER — Telehealth: Payer: Self-pay

## 2013-04-11 ENCOUNTER — Emergency Department: Payer: Self-pay | Admitting: Emergency Medicine

## 2013-04-11 LAB — COMPREHENSIVE METABOLIC PANEL
Albumin: 3.6 g/dL (ref 3.4–5.0)
Anion Gap: 9 (ref 7–16)
BUN: 21 mg/dL — ABNORMAL HIGH (ref 7–18)
Chloride: 104 mmol/L (ref 98–107)
Co2: 24 mmol/L (ref 21–32)
EGFR (African American): >60
EGFR (Non-African Amer.): >60
Glucose: 190 mg/dL — ABNORMAL HIGH (ref 65–99)
Potassium: 3.1 mmol/L — ABNORMAL LOW (ref 3.5–5.1)
SGOT(AST): 27 U/L (ref 15–37)
SGPT (ALT): 40 U/L (ref 12–78)
Sodium: 137 mmol/L (ref 136–145)

## 2013-04-11 LAB — CBC
HCT: 42.4 % (ref 40.0–52.0)
HGB: 15 g/dL (ref 13.0–18.0)
MCH: 31.7 pg (ref 26.0–34.0)
Platelet: 270 10*3/uL (ref 150–440)
RBC: 4.75 10*6/uL (ref 4.40–5.90)
RDW: 13.4 % (ref 11.5–14.5)

## 2013-04-11 LAB — TROPONIN I: Troponin-I: 0.04 ng/mL
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
## 2013-04-12 NOTE — Telephone Encounter (Signed)
Agree with ER.

## 2013-04-17 ENCOUNTER — Encounter: Payer: Self-pay | Admitting: Family Medicine

## 2013-04-17 ENCOUNTER — Ambulatory Visit (INDEPENDENT_AMBULATORY_CARE_PROVIDER_SITE_OTHER): Payer: Medicare Other | Admitting: Family Medicine

## 2013-04-17 DIAGNOSIS — I10 Essential (primary) hypertension: Secondary | ICD-10-CM

## 2013-04-17 DIAGNOSIS — F319 Bipolar disorder, unspecified: Secondary | ICD-10-CM

## 2013-04-17 MED ORDER — LISINOPRIL 20 MG PO TABS: 20.0000 mg | ORAL_TABLET | Freq: Every day | ORAL | Status: DC
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
## 2013-04-17 NOTE — Assessment & Plan Note (Signed)
Inc ACE and report back with BP readings.  He agrees.

## 2013-04-17 NOTE — Assessment & Plan Note (Signed)
Per psych 

## 2013-04-17 NOTE — Patient Instructions (Addendum)
Increase the lisinopril to 20mg  a day.  See about getting your BP checked at work of the pharmacy in about 1 week.  Let me know.

## 2013-04-25 ENCOUNTER — Telehealth: Payer: Self-pay

## 2013-04-25 NOTE — Telephone Encounter (Signed)
Left voicemail letting pt know to schedule a f/u with Dr. Para March next week, but if any new sxs develop over weekend go to [facility] or ER

## 2013-04-25 NOTE — Telephone Encounter (Signed)
Pt called back pt not having h/a, CP, SOB or dizziness. Pt takes Lisinopril at night.Please advise.

## 2013-04-25 NOTE — Telephone Encounter (Signed)
As long as he is feeling ok - f/u with PCP next week - if symptoms develop, please let me know

## 2013-04-25 NOTE — Telephone Encounter (Signed)
Pt left v/m; pt had BP checked x 2 this morning at [facility]. Both times BP was 167/96.Pt is taking Lisinopril 20 mg daily. Unable to reach pt to see if having any type of symptoms today.

## 2013-04-27 NOTE — Telephone Encounter (Signed)
Agreed, schedule for next week.  Thanks.

## 2013-04-28 NOTE — Telephone Encounter (Signed)
Patient advised. Appt scheduled. 

## 2013-05-01 ENCOUNTER — Ambulatory Visit (INDEPENDENT_AMBULATORY_CARE_PROVIDER_SITE_OTHER): Payer: Medicare Other | Admitting: Family Medicine

## 2013-05-01 ENCOUNTER — Encounter: Payer: Self-pay | Admitting: Family Medicine

## 2013-05-01 VITALS — BP 146/100 | HR 88 | Temp 98.2°F | Wt 255.5 lb

## 2013-05-01 DIAGNOSIS — I10 Essential (primary) hypertension: Secondary | ICD-10-CM

## 2013-05-01 LAB — BASIC METABOLIC PANEL
BUN: 10 mg/dL (ref 6–23)
Chloride: 103 mEq/L (ref 96–112)
Glucose, Bld: 139 mg/dL — ABNORMAL HIGH (ref 70–99)
Potassium: 3.9 mEq/L (ref 3.5–5.1)

## 2013-05-01 MED ORDER — LISINOPRIL 20 MG PO TABS: 30.0000 mg | ORAL_TABLET | Freq: Every day | ORAL | Status: DC

## 2013-05-01 NOTE — Progress Notes (Signed)
Hypertension:    Using medication without problems or lightheadedness: yes Chest pain with exertion:no Edema:no Short of breath:no He increased his lisinopril to 20mg . BP improved, but not at goal yet.   Meds, vitals, and allergies reviewed.   PMH and SH reviewed  ROS: See HPI.  Otherwise negative.    GEN: nad, alert and oriented HEENT: mucous membranes moist NECK: supple w/o LA CV: rrr. PULM: ctab, no inc wob ABD: soft, +bs EXT: no edema SKIN: no acute rash  Diabetic foot exam: Normal inspection No skin breakdown No calluses  Normal DP pulses Normal sensation to light touch and monofilament Nails normal

## 2013-05-01 NOTE — Patient Instructions (Addendum)
Go to the lab on the way out.  We'll contact you with your lab report.  Assuming your lab are okay, then increase the lisinopril to 30mg  (1.5 tabs a day).   Let me know if your BP doesn't improve.   Take care.

## 2013-05-02 NOTE — Assessment & Plan Note (Signed)
See notes on labs. Inc ace and have pt report back with BP reading.  He agrees.

## 2013-05-08 ENCOUNTER — Telehealth: Payer: Self-pay

## 2013-05-08 NOTE — Telephone Encounter (Signed)
Victorino Dike with [employer] said just got transfer of lantus with quantity # 5 pens. Dr Para March had put on last lantus rx # 15 pen. Victorino Dike will update refills.

## 2013-05-12 ENCOUNTER — Telehealth: Payer: Self-pay

## 2013-05-12 NOTE — Telephone Encounter (Signed)
Pt left v/m requesting med report Dr Para March prepared re: pt's general health so partial disability benefits thru [employer] can continue. Fax to [facility] [employer] Atten: Morrie Sheldon, fax # [phone].

## 2013-05-12 NOTE — Telephone Encounter (Signed)
Pt left v/m requesting confirmation info sent to [facility].

## 2013-06-15 ENCOUNTER — Other Ambulatory Visit: Payer: Self-pay | Admitting: Family Medicine

## 2013-06-15 DIAGNOSIS — E119 Type 2 diabetes mellitus without complications: Secondary | ICD-10-CM

## 2013-06-17 ENCOUNTER — Other Ambulatory Visit: Payer: Medicare Other

## 2013-06-18 ENCOUNTER — Other Ambulatory Visit: Payer: Medicare Other

## 2013-06-24 ENCOUNTER — Ambulatory Visit: Payer: Medicare Other | Admitting: Family Medicine

## 2013-06-24 DIAGNOSIS — Z0289 Encounter for other administrative examinations: Secondary | ICD-10-CM

## 2013-06-25 ENCOUNTER — Ambulatory Visit: Payer: Medicare Other | Admitting: Family Medicine

## 2013-07-01 ENCOUNTER — Ambulatory Visit: Payer: Medicare Other | Admitting: Family Medicine

## 2013-08-09 ENCOUNTER — Emergency Department: Payer: Self-pay | Admitting: Internal Medicine

## 2013-11-06 ENCOUNTER — Telehealth: Payer: Self-pay

## 2013-11-06 MED ORDER — INSULIN GLARGINE 100 UNIT/ML ~~LOC~~ SOLN
150.0000 [IU] | Freq: Every day | SUBCUTANEOUS | Status: DC
Start: 2013-11-06 — End: 2014-03-09

## 2013-11-06 MED ORDER — METFORMIN HCL 1000 MG PO TABS: ORAL_TABLET | ORAL | Status: DC

## 2013-11-06 NOTE — Telephone Encounter (Signed)
Matt at medication mgt clinic left v/m requesting prescriptions for Lantus and Metformin faxed to [phone] for verification of directions of med.Please advise.

## 2013-11-06 NOTE — Telephone Encounter (Signed)
Printed, please fax over.  He's due for f/u- these were his old doses.   Thanks.

## 2013-11-07 NOTE — Telephone Encounter (Signed)
Faxed as requested with notation that patient needs a follow up office visit.

## 2013-12-29 NOTE — Telephone Encounter (Signed)
Pt left v/m; returning call from few weeks ago about pt scheduling appt; pt has gotten insulin thru [facility] but pt will be needing more diabetic testing supplies. Pt request cb.

## 2013-12-29 NOTE — Telephone Encounter (Signed)
Left message on cell phone to call back

## 2014-01-01 ENCOUNTER — Ambulatory Visit (INDEPENDENT_AMBULATORY_CARE_PROVIDER_SITE_OTHER): Payer: Medicare Other | Admitting: Family Medicine

## 2014-01-01 ENCOUNTER — Encounter: Payer: Self-pay | Admitting: Family Medicine

## 2014-01-01 VITALS — BP 130/90 | HR 97 | Temp 98.3°F | Wt 258.5 lb

## 2014-01-01 DIAGNOSIS — Z23 Encounter for immunization: Secondary | ICD-10-CM

## 2014-01-01 DIAGNOSIS — E119 Type 2 diabetes mellitus without complications: Secondary | ICD-10-CM

## 2014-01-01 LAB — HEMOGLOBIN A1C: Hgb A1c MFr Bld: 10.9 % — ABNORMAL HIGH (ref 4.6–6.5)

## 2014-01-01 MED ORDER — CLONAZEPAM 0.5 MG PO TABS: 0.5000 mg | ORAL_TABLET | Freq: Every day | ORAL | Status: DC | PRN

## 2014-01-01 NOTE — Patient Instructions (Addendum)
Go to the lab on the way out.  We'll contact you with your lab report. Take care.  Glad to see you.  I'll check with Dr. Suzie Portela.

## 2014-01-01 NOTE — Progress Notes (Signed)
Pre visit review using our clinic review tool, if applicable. No additional management support is needed unless otherwise documented below in the visit note.  Diabetes:  Using medications without difficulties: back on insulin for about 1.5 months.  Hypoglycemic episodes:no Hyperglycemic episodes:no Feet problems: some sensitivity, some pain.  Usually tends to be worse in the summer slight numbness in the feet at baseline per patient.  Blood Sugars averaging: 72-122, usually ~100 per patient eye exam within last year: due this fall. D/w pt.  Due for labs.    He was asking about chantix and I told him I would talk to psych first.  He says he doesn't feel depressed.  He has been seen Dr. Elease Etienne with [facility] in [place].    Meds, vitals, and allergies reviewed.   ROS: See HPI.  Otherwise negative.    GEN: nad, alert and oriented, obese HEENT: mucous membranes moist NECK: supple w/o LA CV: rrr. PULM: ctab, no inc wob ABD: soft, +bs EXT: no edema SKIN: no acute rash  Diabetic foot exam: Normal inspection No skin breakdown No calluses  Normal DP pulses Dec sensation to light touch and monofilament distally on the toes B Nails normal

## 2014-01-01 NOTE — Assessment & Plan Note (Signed)
D/w pt about diet and weight.  See notes on labs.  His A1c likely lags his progress, given he was off insulin for a period of time.

## 2014-01-07 ENCOUNTER — Telehealth: Payer: Self-pay

## 2014-01-07 NOTE — Telephone Encounter (Signed)
Pt left v/m wanting to know if Dr Para March has spoken with Dr Suzie Portela in re; to mood since not taking antidepressant.pt wants to know if Dr Para March is going to prescribe the Chantix to help pt stop smoking. Pt [facility] cb [phone] [address]

## 2014-01-07 NOTE — Telephone Encounter (Signed)
I haven't heard back from Dr. Carman Ching.  He's at [facility] in [place].  Please call his clinic and see what his opinion is about the patient starting chantix.   I didn't want to start the med unless he was supportive of the decision.   Thanks.

## 2014-01-08 NOTE — Telephone Encounter (Signed)
Spoke with Dr. Donald Pore assistant and he will call back with response.

## 2014-01-08 NOTE — Telephone Encounter (Signed)
Dr. Donald Pore assistant says that the Chantix is fine with him, he sees no problems.

## 2014-01-09 NOTE — Telephone Encounter (Signed)
Notify the patient.  I would like him to talk to psych about this and get the rx through psych the next times he goes to see them.  Thanks.

## 2014-01-09 NOTE — Telephone Encounter (Signed)
Left detailed message on voicemail.  

## 2014-01-12 NOTE — Telephone Encounter (Signed)
Patient says that his psychiatrist said that he cannot prescribe that medication, it needs to come from his PCP.  Rx needs to go through the medical management people who gets his insulin and that we should have that information.

## 2014-01-13 MED ORDER — VARENICLINE TARTRATE 0.5 MG X 11 & 1 MG X 42 PO MISC: ORAL | Status: DC

## 2014-01-13 MED ORDER — VARENICLINE TARTRATE 1 MG PO TABS: 1.0000 mg | ORAL_TABLET | Freq: Two times a day (BID) | ORAL | Status: DC

## 2014-01-13 NOTE — Telephone Encounter (Signed)
Printed.  If any mood changes on the medicine, the stop chantix and notify us/psych clinic.  Try to stop smoking about 7 days into treatment. Starter rx and then continuation rxs printed.  Thanks.

## 2014-01-13 NOTE — Telephone Encounter (Signed)
Patient advised.  Rx faxed to [facility] [id number]

## 2014-01-29 ENCOUNTER — Other Ambulatory Visit: Payer: Self-pay | Admitting: Family Medicine

## 2014-01-29 MED ORDER — VARENICLINE TARTRATE 0.5 MG X 11 & 1 MG X 42 PO MISC: ORAL | Status: DC

## 2014-01-29 MED ORDER — VARENICLINE TARTRATE 1 MG PO TABS: 1.0000 mg | ORAL_TABLET | Freq: Two times a day (BID) | ORAL | Status: DC

## 2014-02-24 ENCOUNTER — Telehealth: Payer: Self-pay

## 2014-02-24 NOTE — Telephone Encounter (Signed)
Pt left v/m; pt is getting medications thru [facility] on [address] [place] phone [phone] or [phone] fax # [phone]. Pt was advised by medical mgt to contact Dr Para Marchuncan about getting refill of continuing Chantix rx. Pt has 5 pills left of starter pack. Chantix continuation pk rx was faxed to [facility] on 01-13-14. Spoke with no one at medical mg[facility]'; line rang but could not speak with anyone. Spoke with pt and he requested rx refaxed to [phone]. Pt will contact medical [facility] to verify received fax.

## 2014-03-09 ENCOUNTER — Other Ambulatory Visit: Payer: Self-pay | Admitting: *Deleted

## 2014-03-09 MED ORDER — INSULIN GLARGINE 100 UNIT/ML ~~LOC~~ SOLN: 150.0000 [IU] | Freq: Every day | SUBCUTANEOUS | Status: DC

## 2014-03-09 NOTE — Telephone Encounter (Signed)
Faxed to [facility]  P:  [phone]    F:  [phone] or [phone]

## 2014-03-09 NOTE — Telephone Encounter (Signed)
Unable to find pharmacy in database making request

## 2014-03-18 ENCOUNTER — Telehealth: Payer: Self-pay | Admitting: Family Medicine

## 2014-03-18 NOTE — Telephone Encounter (Signed)
Form is on your desk.

## 2014-03-18 NOTE — Telephone Encounter (Signed)
I'll work on the hard copy.  Thanks.  

## 2014-03-18 NOTE — Telephone Encounter (Signed)
Pt dropped off form to be filled out. Forms placed on Lugene's desk. Please call pt when forms are ready to be picked up. He does not want them mailed. Thank you

## 2014-03-24 ENCOUNTER — Encounter: Payer: Self-pay | Admitting: Family Medicine

## 2014-03-24 ENCOUNTER — Other Ambulatory Visit: Payer: Self-pay | Admitting: *Deleted

## 2014-03-24 MED ORDER — LISINOPRIL 20 MG PO TABS
20.0000 mg | ORAL_TABLET | Freq: Every day | ORAL | Status: DC
Start: 2014-03-24 — End: 2014-05-21

## 2014-03-25 NOTE — Telephone Encounter (Signed)
Pt left v/m; pt request status of paperwork left last week; pt wants to know if paperwork is ready for pick up and request cb at [phone].

## 2014-03-25 NOTE — Telephone Encounter (Signed)
Done prev.  I had done this to be sent.  I believe that Lugene had this (if Lyla Son didn't already send it).

## 2014-03-26 NOTE — Telephone Encounter (Signed)
Patient advised that form is ready for pick up. 

## 2014-03-26 NOTE — Telephone Encounter (Signed)
Annette at [facility] refaxed the paperwork.  Given to Dr. Para March to sign and re-date.

## 2014-03-26 NOTE — Telephone Encounter (Addendum)
Form signed.  Thanks. I have it in my office.

## 2014-04-07 ENCOUNTER — Ambulatory Visit: Payer: Medicare Other

## 2014-04-09 ENCOUNTER — Ambulatory Visit: Payer: Medicare Other | Admitting: Family Medicine

## 2014-04-13 ENCOUNTER — Other Ambulatory Visit: Payer: Self-pay | Admitting: *Deleted

## 2014-04-13 MED ORDER — VARENICLINE TARTRATE 1 MG PO TABS: 1.0000 mg | ORAL_TABLET | Freq: Two times a day (BID) | ORAL | Status: DC

## 2014-04-23 ENCOUNTER — Encounter: Payer: Self-pay | Admitting: Family Medicine

## 2014-04-23 ENCOUNTER — Ambulatory Visit (INDEPENDENT_AMBULATORY_CARE_PROVIDER_SITE_OTHER): Payer: Medicare Other | Admitting: Family Medicine

## 2014-04-23 VITALS — BP 126/98 | HR 123 | Temp 98.0°F | Wt 284.0 lb

## 2014-04-23 DIAGNOSIS — E785 Hyperlipidemia, unspecified: Secondary | ICD-10-CM

## 2014-04-23 DIAGNOSIS — E119 Type 2 diabetes mellitus without complications: Secondary | ICD-10-CM

## 2014-04-23 DIAGNOSIS — F17200 Nicotine dependence, unspecified, uncomplicated: Secondary | ICD-10-CM | POA: Diagnosis not present

## 2014-04-23 DIAGNOSIS — I10 Essential (primary) hypertension: Secondary | ICD-10-CM

## 2014-04-23 DIAGNOSIS — Z23 Encounter for immunization: Secondary | ICD-10-CM | POA: Diagnosis not present

## 2014-04-23 LAB — COMPREHENSIVE METABOLIC PANEL
ALBUMIN: 4.2 g/dL (ref 3.5–5.2)
ALT: 36 U/L (ref 0–53)
AST: 24 U/L (ref 0–37)
Alkaline Phosphatase: 74 U/L (ref 39–117)
BUN: 22 mg/dL (ref 6–23)
CALCIUM: 9.6 mg/dL (ref 8.4–10.5)
CHLORIDE: 101 meq/L (ref 96–112)
CO2: 24 mEq/L (ref 19–32)
CREATININE: 1.5 mg/dL (ref 0.4–1.5)
GFR: 54.27 mL/min — ABNORMAL LOW (ref >60.00)
Glucose, Bld: 135 mg/dL — ABNORMAL HIGH (ref 70–99)
POTASSIUM: 4 meq/L (ref 3.5–5.1)
Sodium: 135 mEq/L (ref 135–145)
Total Bilirubin: 0.2 mg/dL (ref 0.2–1.2)
Total Protein: 7.5 g/dL (ref 6.0–8.3)

## 2014-04-23 LAB — HEMOGLOBIN A1C: Hgb A1c MFr Bld: 6 % (ref 4.6–6.5)

## 2014-04-23 LAB — LDL CHOLESTEROL, DIRECT: LDL DIRECT: 91.1 mg/dL

## 2014-04-23 NOTE — Patient Instructions (Addendum)
Call about an eye exam when you get a chance.  Go to the lab on the way out.  We'll contact you with your lab report. Take care.  Glad to see you.

## 2014-04-23 NOTE — Assessment & Plan Note (Signed)
A1c much improved, see notes on labs.  Recheck in 6 months.  D/w pt about weight and diet.  LDL is acceptable for now.

## 2014-04-23 NOTE — Assessment & Plan Note (Signed)
Smoking less, encouraged to work on full cessation.  Tolerating med w/o ADE.

## 2014-04-23 NOTE — Progress Notes (Signed)
Pre visit review using our clinic review tool, if applicable. No additional management support is needed unless otherwise documented below in the visit note.  Smoking, less now.  1.5 packs a day prev, now with 1/3 ppd.  Weight is up in the meantime.  No ADE on med, other than (resolved) abnormal dreams.  Mood is good.  Encouraged patient.    Diabetes:  Using medications without difficulties: yes Hypoglycemic episodes:no Hyperglycemic episodes:no Feet problems:some numbness in the R foot, at baseline.  Blood Sugars averaging: sugar has been 110-117 recently.   eye exam: due, d/w pt.   Due for labs.  Needs a new meter.  I gave him a hard copy rx for meter and strips.   Meds, vitals, and allergies reviewed.   ROS: See HPI.  Otherwise, noncontributory.  GEN: nad, alert and oriented HEENT: mucous membranes moist NECK: supple w/o LA CV: rrr (tachy initially- he attributed that to being nervous about coming in- he relaxed some during the interview) PULM: ctab, no inc wob ABD: soft, +bs, soft small umbilical hernia noted EXT: no edema SKIN: no acute rash  Diabetic foot exam: Normal inspection No skin breakdown Callus noted on R 1st toe Normal DP pulses Normal sensation to light touch and monofilament Nails normal

## 2014-04-24 ENCOUNTER — Encounter: Payer: Self-pay | Admitting: Family Medicine

## 2014-05-21 ENCOUNTER — Telehealth: Payer: Self-pay

## 2014-05-21 MED ORDER — LISINOPRIL 20 MG PO TABS: 40.0000 mg | ORAL_TABLET | Freq: Every day | ORAL | Status: DC

## 2014-05-21 NOTE — Telephone Encounter (Signed)
Spoke with pt and he is aware as instructed and made a f/u appt for 05/27/2014

## 2014-05-21 NOTE — Telephone Encounter (Signed)
Pt left v/m; pt was seen at [facility] on 05/20/14; pt has gum disease,8 teeth that need extraction and other teeth cleaned. Pt will need dentures or oral surgery. At appt on 05/20/14 BP ran high; Dental school said BP would have to come down prior to dental procedures; BP readings on 05/20/14 were 170/95 rested 162/92 and 160/95. Pt had taken lisinopril earlier in morning before appt. At 2:00 pm. Pt wants to know if should change BP med or increase BP med or come in for appt. Pt request cb.

## 2014-05-21 NOTE — Telephone Encounter (Signed)
Have him double his lisinopril and then come in here next week for a recheck.  We can do labs at that point if needed, can readjust his meds if needed. Thanks.   Med list updated.

## 2014-05-27 ENCOUNTER — Ambulatory Visit (INDEPENDENT_AMBULATORY_CARE_PROVIDER_SITE_OTHER): Payer: Medicare Other | Admitting: Family Medicine

## 2014-05-27 ENCOUNTER — Encounter: Payer: Self-pay | Admitting: Family Medicine

## 2014-05-27 VITALS — BP 160/94 | HR 100 | Temp 98.7°F | Wt 286.5 lb

## 2014-05-27 DIAGNOSIS — I10 Essential (primary) hypertension: Secondary | ICD-10-CM | POA: Diagnosis not present

## 2014-05-27 LAB — BASIC METABOLIC PANEL
BUN: 15 mg/dL (ref 6–23)
CO2: 20 mEq/L (ref 19–32)
Calcium: 9.7 mg/dL (ref 8.4–10.5)
Chloride: 105 mEq/L (ref 96–112)
Creatinine, Ser: 1.2 mg/dL (ref 0.4–1.5)
GFR: 70.05 mL/min (ref >60.00)
Glucose, Bld: 96 mg/dL (ref 70–99)
POTASSIUM: 4 meq/L (ref 3.5–5.1)
SODIUM: 139 meq/L (ref 135–145)

## 2014-05-27 MED ORDER — AMLODIPINE BESYLATE 5 MG PO TABS: 5.0000 mg | ORAL_TABLET | Freq: Every day | ORAL | Status: DC

## 2014-05-27 NOTE — Progress Notes (Signed)
Pre visit review using our clinic review tool, if applicable. No additional management support is needed unless otherwise documented below in the visit note.  Tolerating chantix and down to 1/2 PPD.  No ADE on med.    HTN.  [facility].  Here for f/u today.  Dental intervention on hold until BP was controlled.  ACE was increased in the meantime.  No CP, SOB, BLE edema.  Due for BMET.   Meds, vitals, and allergies reviewed.   ROS: See HPI.  Otherwise, noncontributory.  GEN: nad, alert and oriented, obese, poor dentition HEENT: mucous membranes moist NECK: supple w/o LA CV: rrr.   PULM: ctab, no inc wob ABD: soft, +bs EXT: no edema SKIN: no acute rash

## 2014-05-27 NOTE — Assessment & Plan Note (Signed)
Would add on CCB and have patient report back with BP readings.  BMET okay for now.  Continue 40mg  lisinopril.

## 2014-05-27 NOTE — Patient Instructions (Signed)
Go to the lab on the way out.  We'll contact you with your lab report. Don't change your meds yet.  Depending on your labs, we may need to add on amlodipine.   We'll be in touch. Take care.  Glad to see you.

## 2014-05-28 ENCOUNTER — Telehealth: Payer: Self-pay | Admitting: Family Medicine

## 2014-05-28 ENCOUNTER — Telehealth: Payer: Self-pay

## 2014-05-28 NOTE — Telephone Encounter (Signed)
emmi mailed  °

## 2014-05-28 NOTE — Telephone Encounter (Signed)
Pt left v/m; pt picked up amlodipine today; pt said [facility] wants BP to be 160/90 or lower. Pt said if BP is slightly over 160/90 and Dr Para Marchuncan will say it is OK for pt to have procedure then [facility] will proceed with needed work in 05/21/14 phone note. Pt request cb if that would be OK with Dr Para Marchuncan.

## 2014-05-28 NOTE — Telephone Encounter (Signed)
I want to get his BP down regardless of the dental issues.  Have him update me with his BP next week and we'll go from there.  Thanks.

## 2014-05-28 NOTE — Telephone Encounter (Signed)
emmi emailed °

## 2014-05-29 NOTE — Telephone Encounter (Signed)
Spoke with patient and he will keep a record of his BP and call next week

## 2014-05-29 NOTE — Telephone Encounter (Signed)
Thanks
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
## 2014-06-03 NOTE — Telephone Encounter (Signed)
Noted, thanks!

## 2014-06-04 MED ORDER — AMLODIPINE BESYLATE 5 MG PO TABS: 10.0000 mg | ORAL_TABLET | Freq: Every day | ORAL | Status: DC

## 2014-06-04 NOTE — Telephone Encounter (Addendum)
Pt was able to get to pharmacy and 06/04/14 BP 171/103; pt rested and rechecked BP 174/102 and 162/98. No H/A,dizziness, CP or SOB. Pt said has occasional SOB due to trying to stop smoking. Pt presently taking Amlodipine 5 mg between 7 PM and 9PM. Pt request cb. [facility] medical assistance program.

## 2014-06-04 NOTE — Telephone Encounter (Signed)
Thanks for update.  Increase the amlodipine to 10mg  a day (take 2 of the 5mg  tabs together).  Update me re: BP in about 1 week.  Thanks. We can send in a new rx when needed.

## 2014-06-04 NOTE — Addendum Note (Signed)
Addended by: Joaquim NamUNCAN, Samier Jaco S on: 06/04/2014 10:11 AM   Modules accepted: Orders

## 2014-06-05 NOTE — Telephone Encounter (Signed)
Patient advised.

## 2014-07-09 ENCOUNTER — Telehealth: Payer: Self-pay | Admitting: Family Medicine

## 2014-07-09 NOTE — Telephone Encounter (Signed)
Pt calling in and thinks his neuropathy is getting worse. Can anything be done about it? He is going tomorrow to have his BP checked. He will call you with the reading results. Does he need to come in and see you for an appt? It is more difficult to stand on his feet, getting worse overall. If he keeps his feet cool, that helps. Pain is worse at bedtime, but if he keeps his feet uncovered that helps. Please advise! Thank you!

## 2014-07-10 NOTE — Telephone Encounter (Signed)
Patient advised.  Will call back for appt to be scheduled.

## 2014-07-10 NOTE — Telephone Encounter (Signed)
I need to see him back in the clinic.  It will soon be time for A1c so we can do both together.   Thanks.

## 2014-07-14 ENCOUNTER — Ambulatory Visit: Payer: Medicare Other | Admitting: Family Medicine

## 2014-07-17 ENCOUNTER — Other Ambulatory Visit: Payer: Self-pay | Admitting: *Deleted

## 2014-07-17 ENCOUNTER — Telehealth: Payer: Self-pay | Admitting: Family Medicine

## 2014-07-17 MED ORDER — AMLODIPINE BESYLATE 5 MG PO TABS: 10.0000 mg | ORAL_TABLET | Freq: Every day | ORAL | Status: DC

## 2014-07-17 NOTE — Telephone Encounter (Signed)
Medication phoned to pharmacy.  

## 2014-07-17 NOTE — Telephone Encounter (Signed)
[name] called bc med [facility] did not call him to notify him that his rx was there, so he didn't know and when he went there at 1, they were closed. He wanted to know if he could get [facility] [address] @ [phone], or if he could get at least enough to last until Monday. He is requesting a call back.

## 2014-07-17 NOTE — Telephone Encounter (Signed)
Pt left v/m requesting new rx for amlodipine 5 mg taking 2 tabs daily called to [facility]; pt request to be done today; pt has 2 pills left. Pt request cb when new rx called to medication mgt.

## 2014-07-17 NOTE — Telephone Encounter (Signed)
[facility]

## 2014-07-17 NOTE — Addendum Note (Signed)
Addended by: Patience MuscaISLEY, Alta Shober M on: 07/17/2014 10:22 AM   Modules accepted: Orders

## 2014-07-29 ENCOUNTER — Other Ambulatory Visit: Payer: Self-pay | Admitting: Family Medicine

## 2014-07-29 NOTE — Telephone Encounter (Signed)
Pt left vm on triage requesting new written RX's for Chantix, Metformin, testing strips, and lancets.

## 2014-07-30 MED ORDER — INSULIN PEN NEEDLE 31G X 5 MM MISC: Status: DC

## 2014-07-30 MED ORDER — METFORMIN HCL 1000 MG PO TABS: 1000.0000 mg | ORAL_TABLET | Freq: Two times a day (BID) | ORAL | Status: DC

## 2014-07-30 MED ORDER — GLUCOSE BLOOD VI STRP: ORAL_STRIP | Status: DC

## 2014-07-30 NOTE — Telephone Encounter (Signed)
I called and left detailed message on machine as authorized in DPR, for pt to return call to let us know if he plans on continuing Chantix. I will send the other rx's electronically.

## 2014-07-30 NOTE — Telephone Encounter (Signed)
Verify that he is going to continue chantix.  If the rx need to be written today, then you'll have to get another MD to sign off. O/w I'll do it next week. Thanks.

## 2014-07-30 NOTE — Telephone Encounter (Signed)
Pt returned my call, and says that he does plan to continue Chantix.

## 2014-08-02 MED ORDER — VARENICLINE TARTRATE 1 MG PO TABS
1.0000 mg | ORAL_TABLET | Freq: Two times a day (BID) | ORAL | Status: DC
Start: 2014-08-02 — End: 2017-01-15

## 2014-08-02 NOTE — Telephone Encounter (Signed)
Two more months of continuing packs printed. Please fax over.  I wouldn't continue it longer than these next two months. Thanks.

## 2014-08-03 NOTE — Telephone Encounter (Signed)
Rx for Chantix faxed.  Other Rx's sent electronically.

## 2014-08-19 ENCOUNTER — Telehealth: Payer: Self-pay

## 2014-08-19 MED ORDER — LANCETS 30G MISC: Status: DC

## 2014-08-19 NOTE — Telephone Encounter (Signed)
Pt request new rx for lantus insulin using the vial instead of pen and insulin syringes [facility] [place]; pt used to have solostar pens but was changed when [facility] was at [facility]. To vials and syringes. Pt takes lantus 150 units daily. Pt also request refill on lancets; lancets refill done.Please advise.

## 2014-08-20 MED ORDER — INSULIN GLARGINE 100 UNIT/ML ~~LOC~~ SOLN: 150.0000 [IU] | Freq: Every day | SUBCUTANEOUS | Status: DC

## 2014-08-20 MED ORDER — "INSULIN SYRINGE-NEEDLE U-100 27.5G X 5/8"" 2 ML MISC": Status: DC

## 2014-08-20 NOTE — Telephone Encounter (Signed)
[facility] [address].

## 2014-08-20 NOTE — Telephone Encounter (Signed)
Printed, please print rx for 2mL insulin syringes and send it all over.  Thanks.

## 2014-08-24 ENCOUNTER — Telehealth: Payer: Self-pay | Admitting: *Deleted
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
## 2014-08-26 NOTE — Telephone Encounter (Signed)
Noted, thanks!

## 2014-09-09 ENCOUNTER — Telehealth: Payer: Self-pay | Admitting: *Deleted
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
## 2014-09-09 NOTE — Telephone Encounter (Signed)
Noted, thanks for checking on him.
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
## 2014-11-22 NOTE — H&P (Signed)
PATIENT NAME:  [name], [name] MR#:  [id number] DATE OF BIRTH:  1966/06/28  DATE OF ADMISSION:  01/06/2012  DATE OF ASSESSMENT: 01/08/2012   REFERRING PHYSICIAN: Daryel November, MD   ADMITTING PHYSICIAN: Margarita Rana, MD   ATTENDING PHYSICIAN: Kristine Linea, MD    IDENTIFYING DATA: [name] is a 49 year old male with history of bipolar disorder who has not been treatment compliant for a year.   CHIEF COMPLAINT: "I was thinking about cutting my wrists".   HISTORY OF PRESENT ILLNESS: [name] has been under considerable stress for the past several months when he divorced his wife almost nine months ago. Still the custody is unsettled and the patient needs to go to court to lift supervised visitations with his daughter who is 25 months old. He reports that there was no wrongdoing or abuse but his mental health, chronic medical problems and pain made the judge to want the meetings to be supervised. He feels that now he is better able to negotiate it with his wife and the judge. It is surprising as he also reports that for the past year he has not been taking any of his prescribed medications including insulin. He is trying to save money for the liquor bottle. He reports very poor food intake with weight loss of 52 pounds, worsening of mood with anhedonia, social isolation, feeling of guilt, hopelessness, worthlessness, poor energy and concentration, extremely poor sleep and appetite that culminated in suicidal thoughts on the day of admission. The patient drinks alcohol. On admission he suggested that he's a daily drinker. Today he tells me that he drinks twice a month and, therefore, does not need alcohol detox. He has been using quite a bit of Ativan in the hospital which he believed was prescribed for anxiety. He denies illicit substance or prescription pill abuse.   PAST PSYCHIATRIC HISTORY: The patient was hospitalized at [facility] in [facility] for depression. He had  two suicide attempts by overdose on pills and cutting, possible more episodes of overdose. As above, he has not been compliant with medication. He reports doing well on a combination of lithium, Effexor, and Risperdal or Seroquel. Use of antipsychotics, however, contributed to his development of diabetes.   FAMILY PSYCHIATRIC HISTORY: None reported.   PAST MEDICAL HISTORY:  1. Diabetes, now in remission. 2. Obesity.  3. Chronic neuropathic pain, in remission.   ALLERGIES: Depakote.   MEDICATIONS ON ADMISSION: None.   SOCIAL HISTORY: As above, he is disabled. He lives alone in a third floor apartment. He has been divorced for a year. He has limited visitation rights. He has a conflict with his ex-mother-in-law and is very upset with her but hopes that with his ex-wife he will have better cooperation. He wants to get stable to go to court and fight for his visitation rights.   REVIEW OF SYSTEMS: CONSTITUTIONAL: No fevers or chills. Positive for weight loss of 52 pounds. EYES: No double or blurred vision. ENT: No hearing loss. RESPIRATORY: No shortness of breath or cough. CARDIOVASCULAR: No chest pain or orthopnea. GASTROINTESTINAL: No abdominal pain, nausea, vomiting, or diarrhea. GU: No incontinence or frequency. ENDOCRINE: No heat or cold intolerance. LYMPHATIC: No anemia or easy bruising. INTEGUMENTARY: No acne or rash. MUSCULOSKELETAL: No muscle or joint pain. NEUROLOGIC: History of diabetic neuropathy. PSYCHIATRIC: See history of present illness for details.   PHYSICAL EXAMINATION:   VITAL SIGNS: Blood pressure 166/86, pulse 83, respirations 18, temperature 96.8.   GENERAL: This is an obese male  in no acute distress.   HEENT: The pupils are equal, round, and reactive to light.   NECK: Supple. No thyromegaly.   LUNGS: Clear to auscultation. No dullness to percussion.   HEART: Regular rhythm and rate. No murmurs, rubs, or gallops.   ABDOMEN: Soft, nontender, nondistended.    MUSCULOSKELETAL: Normal muscle strength in all extremities.   SKIN: No rashes or bruises.   LYMPHATIC: No cervical adenopathy.   NEUROLOGIC: Cranial nerves II through XII are intact.   LABORATORY DATA: Chemistries are within normal limits. Blood glucose 116, on repeat measure is between 118 and 230. LFTs within normal limits. TSH 1.15. Urine tox screen negative for substances. CBC within normal limits except for white blood count of 12.5. Urinalysis is not suggestive of urinary tract infection. Serum acetaminophen and salicylates are low.   MENTAL STATUS EXAMINATION ON ADMISSION: The patient is alert and oriented to person, place, time, and situation. He is irritable, complains about his inadequate substandard treatment in the hospital, poor communication between nurses and doctors and hospital personnel and the patient himself. He is well groomed and casually dressed. He maintains good eye contact. His speech is of normal rhythm, rate, and volume. Mood is depressed with flat affect. Thought processing is logical and goal oriented. Thought content he denies suicidal or homicidal ideation but was admitted after expressing suicidal ideation at home. There are no delusions or paranoia. There are no auditory or visual hallucinations. His cognition is grossly intact. He registers 3 out of 3 and recalls 3 out of 3 objects after five minutes. He can spell world forward and backward. He knows the current president. His insight and judgment are questionable.   SUICIDE RISK ASSESSMENT ON ADMISSION: This is a patient with a long history of bipolar illness who has not been treated for a year and who is in a difficult social stressful situation going through a divorce from his wife attempting to get a better deal on his custody.   ASSESSMENT:  AXIS I:  1. Bipolar affective disorder, depressed.  2. Alcohol abuse.   AXIS II: Deferred.   AXIS III:  1. Obesity.  2. Diabetes. 3. Chronic obstructive  pulmonary disease. 4. Sleep apnea, on CPAP.   AXIS IV: Mental illness, treatment compliance, substance abuse, divorce, financial, separation from his child, primary support.   AXIS V: GAF on admission 25.   PLAN: The patient was admitted to [facility] for safety, stabilization, and medication management. He was initially placed on suicide precautions and was closely monitored for any unsafe behaviors. He underwent full psychiatric and risk assessment. He received pharmacotherapy, individual and group psychotherapy, substance abuse counseling, and support from therapeutic milieu.  1. Suicidal ideation. This has resolved. The patient is able to contract for safety.  2. Mood. Dr. Guss Bundehalla who admitted the patient placed him on Celexa. The patient would prefer combination of lithium, Effexor, Risperdal, and Seroquel. Will initiate those medications. 3. Alcohol abuse. The patient is very adamant that he is not an alcoholic and does not need alcohol detox. I will discontinue CIWA protocol. Will monitor for symptoms of alcohol withdrawal.  4. CPAP. The patient has complained about not getting his machine in time. He has access to CPAP machine tonight.  5. Disposition. The patient now demands to be discharged quickly. I would prefer to keep him until his lithium level is rechecked. Will discuss it tomorrow.   ____________________________ Ellin GoodieJolanta B. Jennet MaduroPucilowska, MD jbp:drc D: 01/08/2012 18:13:41 ET T:  01/09/2012 06:07:26 ET JOB#: [id number]  cc: Jolanta B. Jennet Maduro, MD, <Dictator> Shari Prows MD ELECTRONICALLY SIGNED 01/11/2012 21:57

## 2014-11-22 NOTE — Consult Note (Signed)
Patient was seen at request of Attending MD for orientation to the CD-IOP. Patient was orientated and indicated disinterest in the CD-IOP at this point of his life.  He was able to report reasons as to how it was that he came to the hospital and indicated plans to remain abstinence of addictive drugs.   Electronic Signatures: Huel Cotehomas, Richard (PsyD)  (Signed on 11-Jun-13 16:25)  Authored  Last Updated: 11-Jun-13 16:25 by Huel Cotehomas, Richard (PsyD)

## 2014-12-03 DIAGNOSIS — F250 Schizoaffective disorder, bipolar type: Secondary | ICD-10-CM | POA: Diagnosis not present

## 2014-12-03 DIAGNOSIS — F411 Generalized anxiety disorder: Secondary | ICD-10-CM | POA: Diagnosis not present

## 2014-12-04 ENCOUNTER — Ambulatory Visit: Payer: Medicare Other

## 2014-12-21 ENCOUNTER — Encounter: Payer: Self-pay | Admitting: Family Medicine

## 2015-02-18 ENCOUNTER — Ambulatory Visit: Payer: PPO | Attending: Neurology

## 2015-03-16 ENCOUNTER — Telehealth: Payer: Self-pay | Admitting: Family Medicine

## 2015-03-16 NOTE — Telephone Encounter (Signed)
Long ter disability from [employer] In Dr Lianne Bushy IN BOX For review and signature

## 2015-03-17 NOTE — Telephone Encounter (Signed)
Form done.  Please scan and send.  Thanks.  I have no evidence that he is not competent to make financial decisions.

## 2015-03-18 NOTE — Telephone Encounter (Signed)
Form faxed to brian @ [phone] 03/18/15

## 2015-05-06 ENCOUNTER — Ambulatory Visit: Payer: PPO | Attending: Neurology

## 2015-05-06 DIAGNOSIS — G4761 Periodic limb movement disorder: Secondary | ICD-10-CM | POA: Diagnosis not present

## 2015-05-06 DIAGNOSIS — G4733 Obstructive sleep apnea (adult) (pediatric): Secondary | ICD-10-CM | POA: Diagnosis not present

## 2015-06-14 ENCOUNTER — Encounter (INDEPENDENT_AMBULATORY_CARE_PROVIDER_SITE_OTHER): Payer: Self-pay

## 2015-06-14 ENCOUNTER — Encounter: Payer: Medicare Other | Admitting: Pharmacist

## 2015-08-10 DIAGNOSIS — G4733 Obstructive sleep apnea (adult) (pediatric): Secondary | ICD-10-CM | POA: Diagnosis not present

## 2015-10-09 ENCOUNTER — Emergency Department
Admission: EM | Admit: 2015-10-09 | Discharge: 2015-10-09 | Disposition: A | Discharge status: Home / Self Care | Payer: PPO | Attending: Emergency Medicine | Admitting: Emergency Medicine

## 2015-10-09 DIAGNOSIS — Y929 Unspecified place or not applicable: Secondary | ICD-10-CM | POA: Diagnosis not present

## 2015-10-09 DIAGNOSIS — F329 Major depressive disorder, single episode, unspecified: Secondary | ICD-10-CM | POA: Diagnosis not present

## 2015-10-09 DIAGNOSIS — S335XXA Sprain of ligaments of lumbar spine, initial encounter: Secondary | ICD-10-CM | POA: Insufficient documentation

## 2015-10-09 DIAGNOSIS — Y999 Unspecified external cause status: Secondary | ICD-10-CM | POA: Diagnosis not present

## 2015-10-09 DIAGNOSIS — Z794 Long term (current) use of insulin: Secondary | ICD-10-CM | POA: Diagnosis not present

## 2015-10-09 DIAGNOSIS — E119 Type 2 diabetes mellitus without complications: Secondary | ICD-10-CM | POA: Diagnosis not present

## 2015-10-09 DIAGNOSIS — E785 Hyperlipidemia, unspecified: Secondary | ICD-10-CM | POA: Diagnosis not present

## 2015-10-09 DIAGNOSIS — X58XXXA Exposure to other specified factors, initial encounter: Secondary | ICD-10-CM | POA: Insufficient documentation

## 2015-10-09 DIAGNOSIS — Y939 Activity, unspecified: Secondary | ICD-10-CM | POA: Insufficient documentation

## 2015-10-09 DIAGNOSIS — I10 Essential (primary) hypertension: Secondary | ICD-10-CM | POA: Diagnosis not present

## 2015-10-09 DIAGNOSIS — J449 Chronic obstructive pulmonary disease, unspecified: Secondary | ICD-10-CM | POA: Insufficient documentation

## 2015-10-09 DIAGNOSIS — Z7984 Long term (current) use of oral hypoglycemic drugs: Secondary | ICD-10-CM | POA: Diagnosis not present

## 2015-10-09 DIAGNOSIS — Z79899 Other long term (current) drug therapy: Secondary | ICD-10-CM | POA: Insufficient documentation

## 2015-10-09 DIAGNOSIS — F17210 Nicotine dependence, cigarettes, uncomplicated: Secondary | ICD-10-CM | POA: Insufficient documentation

## 2015-10-09 DIAGNOSIS — Z888 Allergy status to other drugs, medicaments and biological substances status: Secondary | ICD-10-CM | POA: Insufficient documentation

## 2015-10-09 DIAGNOSIS — S239XXA Sprain of unspecified parts of thorax, initial encounter: Secondary | ICD-10-CM

## 2015-10-09 DIAGNOSIS — M549 Dorsalgia, unspecified: Secondary | ICD-10-CM | POA: Diagnosis not present

## 2015-10-09 MED ORDER — MORPHINE SULFATE (PF) 4 MG/ML IV SOLN
4.0000 mg | Freq: Once | INTRAVENOUS | Status: AC
  Administered 2015-10-09: 4 mg via INTRAMUSCULAR
  Filled 2015-10-09: qty 1

## 2015-10-09 MED ORDER — OXYCODONE-ACETAMINOPHEN 5-325 MG PO TABS: 1.0000 | ORAL_TABLET | Freq: Four times a day (QID) | ORAL | Status: DC | PRN

## 2015-10-09 MED ORDER — KETOROLAC TROMETHAMINE 30 MG/ML IJ SOLN
30.0000 mg | Freq: Once | INTRAMUSCULAR | Status: AC
  Administered 2015-10-09: 30 mg via INTRAMUSCULAR
  Filled 2015-10-09: qty 1

## 2015-10-09 NOTE — ED Notes (Signed)
Pt reports falling 2 days ago landing on his side. Pt reports today he was using the bathroom and went to pull up his pants and felt a pull in his back. Pt ha history of spinal fusion and had back surgery about 8 years ago.
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
## 2015-10-18 DIAGNOSIS — F250 Schizoaffective disorder, bipolar type: Secondary | ICD-10-CM | POA: Diagnosis not present

## 2015-10-18 DIAGNOSIS — F411 Generalized anxiety disorder: Secondary | ICD-10-CM | POA: Diagnosis not present

## 2015-10-18 DIAGNOSIS — F4310 Post-traumatic stress disorder, unspecified: Secondary | ICD-10-CM | POA: Diagnosis not present

## 2015-12-16 DIAGNOSIS — F411 Generalized anxiety disorder: Secondary | ICD-10-CM | POA: Diagnosis not present

## 2015-12-16 DIAGNOSIS — G4730 Sleep apnea, unspecified: Secondary | ICD-10-CM | POA: Diagnosis not present

## 2015-12-16 DIAGNOSIS — F17200 Nicotine dependence, unspecified, uncomplicated: Secondary | ICD-10-CM | POA: Diagnosis not present

## 2015-12-16 DIAGNOSIS — Z79899 Other long term (current) drug therapy: Secondary | ICD-10-CM | POA: Diagnosis not present

## 2015-12-16 DIAGNOSIS — I10 Essential (primary) hypertension: Secondary | ICD-10-CM | POA: Diagnosis not present

## 2015-12-16 DIAGNOSIS — F4310 Post-traumatic stress disorder, unspecified: Secondary | ICD-10-CM | POA: Diagnosis not present

## 2015-12-16 DIAGNOSIS — M545 Low back pain: Secondary | ICD-10-CM | POA: Diagnosis not present

## 2015-12-16 DIAGNOSIS — E1140 Type 2 diabetes mellitus with diabetic neuropathy, unspecified: Secondary | ICD-10-CM | POA: Diagnosis not present

## 2015-12-16 DIAGNOSIS — R7309 Other abnormal glucose: Secondary | ICD-10-CM | POA: Diagnosis not present

## 2015-12-16 DIAGNOSIS — E119 Type 2 diabetes mellitus without complications: Secondary | ICD-10-CM | POA: Diagnosis not present

## 2015-12-16 DIAGNOSIS — F250 Schizoaffective disorder, bipolar type: Secondary | ICD-10-CM | POA: Diagnosis not present

## 2015-12-28 ENCOUNTER — Emergency Department
Admission: EM | Admit: 2015-12-28 | Discharge: 2015-12-28 | Disposition: A | Discharge status: Home / Self Care | Payer: PPO | Attending: Emergency Medicine | Admitting: Emergency Medicine

## 2015-12-28 ENCOUNTER — Encounter: Payer: Self-pay | Admitting: Emergency Medicine

## 2015-12-28 ENCOUNTER — Emergency Department: Payer: PPO

## 2015-12-28 DIAGNOSIS — E785 Hyperlipidemia, unspecified: Secondary | ICD-10-CM | POA: Insufficient documentation

## 2015-12-28 DIAGNOSIS — M436 Torticollis: Secondary | ICD-10-CM

## 2015-12-28 DIAGNOSIS — F17210 Nicotine dependence, cigarettes, uncomplicated: Secondary | ICD-10-CM | POA: Diagnosis not present

## 2015-12-28 DIAGNOSIS — F250 Schizoaffective disorder, bipolar type: Secondary | ICD-10-CM | POA: Insufficient documentation

## 2015-12-28 DIAGNOSIS — J449 Chronic obstructive pulmonary disease, unspecified: Secondary | ICD-10-CM | POA: Insufficient documentation

## 2015-12-28 DIAGNOSIS — E119 Type 2 diabetes mellitus without complications: Secondary | ICD-10-CM | POA: Diagnosis not present

## 2015-12-28 DIAGNOSIS — Z794 Long term (current) use of insulin: Secondary | ICD-10-CM | POA: Insufficient documentation

## 2015-12-28 DIAGNOSIS — Z7984 Long term (current) use of oral hypoglycemic drugs: Secondary | ICD-10-CM | POA: Diagnosis not present

## 2015-12-28 DIAGNOSIS — M542 Cervicalgia: Secondary | ICD-10-CM | POA: Diagnosis not present

## 2015-12-28 DIAGNOSIS — I10 Essential (primary) hypertension: Secondary | ICD-10-CM | POA: Diagnosis not present

## 2015-12-28 DIAGNOSIS — Z79899 Other long term (current) drug therapy: Secondary | ICD-10-CM | POA: Insufficient documentation

## 2015-12-28 MED ORDER — DIAZEPAM 5 MG/ML IJ SOLN
5.0000 mg | Freq: Once | INTRAMUSCULAR | Status: DC
  Filled 2015-12-28: qty 2

## 2015-12-28 MED ORDER — IBUPROFEN 800 MG PO TABS: 800.0000 mg | ORAL_TABLET | Freq: Three times a day (TID) | ORAL | Status: DC | PRN

## 2015-12-28 MED ORDER — METHOCARBAMOL 500 MG PO TABS: 500.0000 mg | ORAL_TABLET | Freq: Four times a day (QID) | ORAL | Status: DC | PRN

## 2015-12-28 MED ORDER — OXYCODONE-ACETAMINOPHEN 5-325 MG PO TABS
1.0000 | ORAL_TABLET | Freq: Once | ORAL | Status: AC
  Administered 2015-12-28: 1 via ORAL
  Filled 2015-12-28: qty 1

## 2015-12-28 MED ORDER — DIAZEPAM 5 MG/ML IJ SOLN
5.0000 mg | Freq: Once | INTRAMUSCULAR | Status: AC
  Administered 2015-12-28: 5 mg via INTRAMUSCULAR

## 2015-12-28 MED ORDER — OXYCODONE-ACETAMINOPHEN 5-325 MG PO TABS: 1.0000 | ORAL_TABLET | ORAL | Status: DC | PRN

## 2015-12-28 NOTE — ED Notes (Signed)
Pt presents with possible pulled muscle on left side of neck since last Thursday. States IBU and tiger balm muscle rub having been helping at home.

## 2015-12-28 NOTE — ED Notes (Signed)
States he developed pain to neck about 2 weeks ago  Denies any trauma ,or fever   States pain is worse over the past few days  Pain now radiates into left [place]oulder
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
## 2015-12-28 NOTE — Discharge Instructions (Signed)
Acute Torticollis °Torticollis is a condition in which the muscles of the neck tighten (contract) abnormally, causing the neck to twist and the head to move into an unnatural position. Torticollis that develops suddenly is called acute torticollis. If torticollis becomes chronic and is left untreated, the face and neck can become deformed. °CAUSES °This condition may be caused by: °· Sleeping in an awkward position (common). °· Extending or twisting the neck muscles beyond their normal position. °· Infection. °In some cases, the cause may not be known. °SYMPTOMS °Symptoms of this condition include: °· An unnatural position of the head. °· Neck pain. °· A limited ability to move the neck. °· Twisting of the neck to one side. °DIAGNOSIS °This condition is diagnosed with a physical exam. You may also have imaging tests, such as an X-ray, CT scan, or MRI. °TREATMENT °Treatment for this condition involves trying to relax the neck muscles. It may include: °· Medicines or shots. °· Physical therapy. °· Surgery. This may be done in severe cases. °HOME CARE INSTRUCTIONS °· Take medicines only as directed by your health care provider. °· Do stretching exercises and massage your neck as directed by your health care provider. °· Keep all follow-up visits as directed by your health care provider. This is important. °SEEK MEDICAL CARE IF: °· You develop a fever. °SEEK IMMEDIATE MEDICAL CARE IF: °· You develop difficulty breathing. °· You develop noisy breathing (stridor). °· You start drooling. °· You have trouble swallowing or have pain with swallowing. °· You develop numbness or weakness in your hands or feet. °· You have changes in your speech, understanding, or vision. °· Your pain gets worse. °  °This information is not intended to replace advice given to you by your health care provider. Make sure you discuss any questions you have with your health care provider. °  °Document Released: 07/14/2000 Document Revised:  12/01/2014 Document Reviewed: 07/13/2014 °Elsevier Interactive Patient Education ©2016 [employer] ° °

## 2016-01-04 DIAGNOSIS — G4733 Obstructive sleep apnea (adult) (pediatric): Secondary | ICD-10-CM | POA: Diagnosis not present

## 2016-02-07 DIAGNOSIS — G4733 Obstructive sleep apnea (adult) (pediatric): Secondary | ICD-10-CM | POA: Diagnosis not present

## 2016-02-28 ENCOUNTER — Encounter: Payer: Self-pay | Admitting: Gastroenterology

## 2016-03-09 DIAGNOSIS — G4733 Obstructive sleep apnea (adult) (pediatric): Secondary | ICD-10-CM | POA: Diagnosis not present

## 2016-04-09 DIAGNOSIS — G4733 Obstructive sleep apnea (adult) (pediatric): Secondary | ICD-10-CM | POA: Diagnosis not present

## 2016-04-21 DIAGNOSIS — Z1211 Encounter for screening for malignant neoplasm of colon: Secondary | ICD-10-CM | POA: Diagnosis not present

## 2016-04-21 DIAGNOSIS — F250 Schizoaffective disorder, bipolar type: Secondary | ICD-10-CM | POA: Diagnosis not present

## 2016-04-21 DIAGNOSIS — R7309 Other abnormal glucose: Secondary | ICD-10-CM | POA: Diagnosis not present

## 2016-04-21 DIAGNOSIS — I10 Essential (primary) hypertension: Secondary | ICD-10-CM | POA: Diagnosis not present

## 2016-04-21 DIAGNOSIS — F17200 Nicotine dependence, unspecified, uncomplicated: Secondary | ICD-10-CM | POA: Diagnosis not present

## 2016-04-21 DIAGNOSIS — Z1389 Encounter for screening for other disorder: Secondary | ICD-10-CM | POA: Diagnosis not present

## 2016-04-21 DIAGNOSIS — E119 Type 2 diabetes mellitus without complications: Secondary | ICD-10-CM | POA: Diagnosis not present

## 2016-04-21 DIAGNOSIS — M436 Torticollis: Secondary | ICD-10-CM | POA: Diagnosis not present

## 2016-05-09 DIAGNOSIS — G4733 Obstructive sleep apnea (adult) (pediatric): Secondary | ICD-10-CM | POA: Diagnosis not present

## 2016-05-25 DIAGNOSIS — F411 Generalized anxiety disorder: Secondary | ICD-10-CM | POA: Diagnosis not present

## 2016-05-25 DIAGNOSIS — F4310 Post-traumatic stress disorder, unspecified: Secondary | ICD-10-CM | POA: Diagnosis not present

## 2016-05-25 DIAGNOSIS — F250 Schizoaffective disorder, bipolar type: Secondary | ICD-10-CM | POA: Diagnosis not present

## 2016-06-09 DIAGNOSIS — G4733 Obstructive sleep apnea (adult) (pediatric): Secondary | ICD-10-CM | POA: Diagnosis not present

## 2016-06-14 DIAGNOSIS — E083293 Diabetes mellitus due to underlying condition with mild nonproliferative diabetic retinopathy without macular edema, bilateral: Secondary | ICD-10-CM | POA: Diagnosis not present

## 2016-07-09 DIAGNOSIS — G4733 Obstructive sleep apnea (adult) (pediatric): Secondary | ICD-10-CM | POA: Diagnosis not present

## 2016-08-09 DIAGNOSIS — G4733 Obstructive sleep apnea (adult) (pediatric): Secondary | ICD-10-CM | POA: Diagnosis not present

## 2016-09-09 DIAGNOSIS — G4733 Obstructive sleep apnea (adult) (pediatric): Secondary | ICD-10-CM | POA: Diagnosis not present

## 2016-09-22 DIAGNOSIS — F250 Schizoaffective disorder, bipolar type: Secondary | ICD-10-CM | POA: Diagnosis not present

## 2016-09-22 DIAGNOSIS — F411 Generalized anxiety disorder: Secondary | ICD-10-CM | POA: Diagnosis not present

## 2016-09-22 DIAGNOSIS — F4310 Post-traumatic stress disorder, unspecified: Secondary | ICD-10-CM | POA: Diagnosis not present

## 2016-10-07 DIAGNOSIS — G4733 Obstructive sleep apnea (adult) (pediatric): Secondary | ICD-10-CM | POA: Diagnosis not present

## 2016-11-07 DIAGNOSIS — G4733 Obstructive sleep apnea (adult) (pediatric): Secondary | ICD-10-CM | POA: Diagnosis not present

## 2016-11-22 ENCOUNTER — Ambulatory Visit (INDEPENDENT_AMBULATORY_CARE_PROVIDER_SITE_OTHER): Payer: Self-pay | Admitting: Specialist

## 2016-12-29 ENCOUNTER — Inpatient Hospital Stay
Admission: EM | Admit: 2016-12-29 | Discharge: 2017-01-15 | DRG: 885 | Disposition: A | Discharge status: Home / Self Care | Payer: PPO | Source: Intra-hospital | Attending: Psychiatry | Admitting: Psychiatry

## 2016-12-29 ENCOUNTER — Encounter: Payer: Self-pay | Admitting: Emergency Medicine

## 2016-12-29 ENCOUNTER — Emergency Department
Admission: EM | Admit: 2016-12-29 | Discharge: 2016-12-29 | Disposition: A | Discharge status: Other Acute Inpatient Hospital | Payer: PPO | Attending: Emergency Medicine | Admitting: Emergency Medicine

## 2016-12-29 DIAGNOSIS — F329 Major depressive disorder, single episode, unspecified: Secondary | ICD-10-CM

## 2016-12-29 DIAGNOSIS — G8929 Other chronic pain: Secondary | ICD-10-CM | POA: Diagnosis present

## 2016-12-29 DIAGNOSIS — F17210 Nicotine dependence, cigarettes, uncomplicated: Secondary | ICD-10-CM | POA: Insufficient documentation

## 2016-12-29 DIAGNOSIS — F309 Manic episode, unspecified: Secondary | ICD-10-CM | POA: Diagnosis not present

## 2016-12-29 DIAGNOSIS — Z6834 Body mass index (BMI) 34.0-34.9, adult: Secondary | ICD-10-CM | POA: Diagnosis not present

## 2016-12-29 DIAGNOSIS — M549 Dorsalgia, unspecified: Secondary | ICD-10-CM | POA: Diagnosis not present

## 2016-12-29 DIAGNOSIS — J449 Chronic obstructive pulmonary disease, unspecified: Secondary | ICD-10-CM | POA: Diagnosis not present

## 2016-12-29 DIAGNOSIS — G4700 Insomnia, unspecified: Secondary | ICD-10-CM | POA: Diagnosis present

## 2016-12-29 DIAGNOSIS — E119 Type 2 diabetes mellitus without complications: Secondary | ICD-10-CM | POA: Diagnosis not present

## 2016-12-29 DIAGNOSIS — F250 Schizoaffective disorder, bipolar type: Principal | ICD-10-CM | POA: Diagnosis present

## 2016-12-29 DIAGNOSIS — E669 Obesity, unspecified: Secondary | ICD-10-CM | POA: Diagnosis not present

## 2016-12-29 DIAGNOSIS — Z7984 Long term (current) use of oral hypoglycemic drugs: Secondary | ICD-10-CM | POA: Insufficient documentation

## 2016-12-29 DIAGNOSIS — F4310 Post-traumatic stress disorder, unspecified: Secondary | ICD-10-CM | POA: Diagnosis not present

## 2016-12-29 DIAGNOSIS — I10 Essential (primary) hypertension: Secondary | ICD-10-CM | POA: Diagnosis not present

## 2016-12-29 DIAGNOSIS — E785 Hyperlipidemia, unspecified: Secondary | ICD-10-CM | POA: Diagnosis not present

## 2016-12-29 DIAGNOSIS — F32A Depression, unspecified: Secondary | ICD-10-CM

## 2016-12-29 DIAGNOSIS — R631 Polydipsia: Secondary | ICD-10-CM | POA: Diagnosis present

## 2016-12-29 DIAGNOSIS — Z79899 Other long term (current) drug therapy: Secondary | ICD-10-CM

## 2016-12-29 DIAGNOSIS — R45851 Suicidal ideations: Secondary | ICD-10-CM | POA: Diagnosis present

## 2016-12-29 DIAGNOSIS — Z794 Long term (current) use of insulin: Secondary | ICD-10-CM | POA: Diagnosis not present

## 2016-12-29 DIAGNOSIS — F319 Bipolar disorder, unspecified: Secondary | ICD-10-CM | POA: Diagnosis not present

## 2016-12-29 DIAGNOSIS — Z818 Family history of other mental and behavioral disorders: Secondary | ICD-10-CM

## 2016-12-29 DIAGNOSIS — Z62810 Personal history of physical and sexual abuse in childhood: Secondary | ICD-10-CM | POA: Diagnosis present

## 2016-12-29 DIAGNOSIS — R451 Restlessness and agitation: Secondary | ICD-10-CM | POA: Diagnosis not present

## 2016-12-29 DIAGNOSIS — G4733 Obstructive sleep apnea (adult) (pediatric): Secondary | ICD-10-CM | POA: Diagnosis not present

## 2016-12-29 DIAGNOSIS — Z915 Personal history of self-harm: Secondary | ICD-10-CM

## 2016-12-29 LAB — CBC
HEMATOCRIT: 42.9 % (ref 40.0–52.0)
HEMOGLOBIN: 14.7 g/dL (ref 13.0–18.0)
MCH: 31.2 pg (ref 26.0–34.0)
MCHC: 34.2 g/dL (ref 32.0–36.0)
MCV: 91.1 fL (ref 80.0–100.0)
Platelets: 313 10*3/uL (ref 150–440)
RBC: 4.71 MIL/uL (ref 4.40–5.90)
RDW: 13.2 % (ref 11.5–14.5)
WBC: 11.8 10*3/uL — ABNORMAL HIGH (ref 3.8–10.6)

## 2016-12-29 LAB — COMPREHENSIVE METABOLIC PANEL
ALBUMIN: 4.4 g/dL (ref 3.5–5.0)
ALT: 17 U/L (ref 17–63)
AST: 25 U/L (ref 15–41)
Alkaline Phosphatase: 82 U/L (ref 38–126)
Anion gap: 7 (ref 5–15)
BUN: 15 mg/dL (ref 6–20)
CO2: 25 mmol/L (ref 22–32)
CREATININE: 0.99 mg/dL (ref 0.61–1.24)
Calcium: 9.4 mg/dL (ref 8.9–10.3)
Chloride: 104 mmol/L (ref 101–111)
GFR calc non Af Amer: >60 mL/min (ref >60)
GLUCOSE: 140 mg/dL — AB (ref 65–99)
Potassium: 3.5 mmol/L (ref 3.5–5.1)
SODIUM: 136 mmol/L (ref 135–145)
Total Bilirubin: 0.6 mg/dL (ref 0.3–1.2)
Total Protein: 7.6 g/dL (ref 6.5–8.1)

## 2016-12-29 LAB — LITHIUM LEVEL: Lithium Lvl: <0.06 mmol/L — ABNORMAL LOW (ref 0.60–1.20)

## 2016-12-29 LAB — URINE DRUG SCREEN, QUALITATIVE (ARMC ONLY)
Amphetamines, Ur Screen: NOT DETECTED
BARBITURATES, UR SCREEN: NOT DETECTED
Benzodiazepine, Ur Scrn: NOT DETECTED
CANNABINOID 50 NG, UR ~~LOC~~: NOT DETECTED
COCAINE METABOLITE, UR ~~LOC~~: NOT DETECTED
MDMA (ECSTASY) UR SCREEN: NOT DETECTED
Methadone Scn, Ur: NOT DETECTED
OPIATE, UR SCREEN: NOT DETECTED
Phencyclidine (PCP) Ur S: NOT DETECTED
Tricyclic, Ur Screen: NOT DETECTED

## 2016-12-29 LAB — ACETAMINOPHEN LEVEL: Acetaminophen (Tylenol), Serum: <10 ug/mL — ABNORMAL LOW (ref 10–30)

## 2016-12-29 LAB — GLUCOSE, CAPILLARY: GLUCOSE-CAPILLARY: 120 mg/dL — AB (ref 65–99)

## 2016-12-29 LAB — ETHANOL: Alcohol, Ethyl (B): <5 mg/dL (ref <5)

## 2016-12-29 LAB — SALICYLATE LEVEL: Salicylate Lvl: <7 mg/dL (ref 2.8–30.0)

## 2016-12-29 MED ORDER — INSULIN DETEMIR 100 UNIT/ML ~~LOC~~ SOLN
35.0000 [IU] | Freq: Two times a day (BID) | SUBCUTANEOUS | Status: DC
  Filled 2016-12-29 (×2): qty 0.35

## 2016-12-29 MED ORDER — LISINOPRIL 20 MG PO TABS
40.0000 mg | ORAL_TABLET | Freq: Every day | ORAL | Status: DC
  Administered 2016-12-29 – 2017-01-01 (×4): 40 mg via ORAL
  Filled 2016-12-29 (×4): qty 2

## 2016-12-29 MED ORDER — INSULIN DETEMIR 100 UNIT/ML ~~LOC~~ SOLN
35.0000 [IU] | Freq: Two times a day (BID) | SUBCUTANEOUS | Status: DC
  Administered 2016-12-29 – 2017-01-15 (×34): 35 [IU] via SUBCUTANEOUS
  Filled 2016-12-29 (×37): qty 0.35

## 2016-12-29 MED ORDER — MAGNESIUM HYDROXIDE 400 MG/5ML PO SUSP
30.0000 mL | Freq: Every day | ORAL | Status: DC | PRN
  Administered 2017-01-14 – 2017-01-15 (×2): 30 mL via ORAL
  Filled 2016-12-29 (×2): qty 30

## 2016-12-29 MED ORDER — LITHIUM CARBONATE ER 450 MG PO TBCR: 450.0000 mg | EXTENDED_RELEASE_TABLET | Freq: Three times a day (TID) | ORAL | Status: DC

## 2016-12-29 MED ORDER — BENZTROPINE MESYLATE 1 MG PO TABS: 0.5000 mg | ORAL_TABLET | Freq: Two times a day (BID) | ORAL | Status: DC | PRN

## 2016-12-29 MED ORDER — BENZTROPINE MESYLATE 0.5 MG PO TABS: 1.0000 mg | ORAL_TABLET | Freq: Two times a day (BID) | ORAL | Status: DC | PRN

## 2016-12-29 MED ORDER — LISINOPRIL 10 MG PO TABS: 40.0000 mg | ORAL_TABLET | Freq: Every day | ORAL | Status: DC

## 2016-12-29 MED ORDER — CITALOPRAM HYDROBROMIDE 20 MG PO TABS
40.0000 mg | ORAL_TABLET | Freq: Every day | ORAL | Status: DC
  Administered 2016-12-29 – 2017-01-01 (×4): 40 mg via ORAL
  Filled 2016-12-29 (×4): qty 2

## 2016-12-29 MED ORDER — CITALOPRAM HYDROBROMIDE 20 MG PO TABS
20.0000 mg | ORAL_TABLET | Freq: Every day | ORAL | Status: DC
  Administered 2016-12-29: 20 mg via ORAL
  Filled 2016-12-29: qty 1

## 2016-12-29 MED ORDER — CLONAZEPAM 0.5 MG PO TABS
0.5000 mg | ORAL_TABLET | Freq: Two times a day (BID) | ORAL | Status: DC
  Administered 2016-12-29 – 2017-01-01 (×6): 0.5 mg via ORAL
  Filled 2016-12-29 (×6): qty 1

## 2016-12-29 MED ORDER — ALUM & MAG HYDROXIDE-SIMETH 200-200-20 MG/5ML PO SUSP: 30.0000 mL | ORAL | Status: DC | PRN

## 2016-12-29 MED ORDER — GABAPENTIN 600 MG PO TABS
300.0000 mg | ORAL_TABLET | Freq: Three times a day (TID) | ORAL | Status: DC
  Administered 2016-12-29 – 2017-01-01 (×9): 300 mg via ORAL
  Filled 2016-12-29: qty 2
  Filled 2016-12-29 (×2): qty 1
  Filled 2016-12-29 (×2): qty 2
  Filled 2016-12-29 (×4): qty 1

## 2016-12-29 MED ORDER — AMLODIPINE BESYLATE 5 MG PO TABS
10.0000 mg | ORAL_TABLET | Freq: Every day | ORAL | Status: DC
  Administered 2016-12-30 – 2017-01-15 (×17): 10 mg via ORAL
  Filled 2016-12-29 (×17): qty 2

## 2016-12-29 MED ORDER — HYDROXYZINE HCL 25 MG PO TABS
25.0000 mg | ORAL_TABLET | Freq: Three times a day (TID) | ORAL | Status: DC | PRN
  Administered 2016-12-31: 25 mg via ORAL
  Filled 2016-12-29: qty 1

## 2016-12-29 MED ORDER — RISPERIDONE 1 MG PO TABS
2.0000 mg | ORAL_TABLET | Freq: Two times a day (BID) | ORAL | Status: DC
  Administered 2016-12-29 – 2017-01-01 (×6): 2 mg via ORAL
  Filled 2016-12-29 (×6): qty 2

## 2016-12-29 MED ORDER — CLONAZEPAM 0.5 MG PO TABS
0.5000 mg | ORAL_TABLET | Freq: Two times a day (BID) | ORAL | Status: DC
  Administered 2016-12-29: 0.5 mg via ORAL
  Filled 2016-12-29: qty 1

## 2016-12-29 MED ORDER — INSULIN ASPART 100 UNIT/ML ~~LOC~~ SOLN: 0.0000 [IU] | Freq: Three times a day (TID) | SUBCUTANEOUS | Status: DC

## 2016-12-29 MED ORDER — INSULIN DETEMIR 100 UNIT/ML ~~LOC~~ SOLN
35.0000 [IU] | Freq: Two times a day (BID) | SUBCUTANEOUS | Status: DC
  Filled 2016-12-29 (×3): qty 0.35

## 2016-12-29 MED ORDER — METFORMIN HCL 500 MG PO TABS: 1000.0000 mg | ORAL_TABLET | Freq: Two times a day (BID) | ORAL | Status: DC

## 2016-12-29 MED ORDER — INSULIN ASPART 100 UNIT/ML ~~LOC~~ SOLN
0.0000 [IU] | Freq: Three times a day (TID) | SUBCUTANEOUS | Status: DC
  Administered 2016-12-30 – 2016-12-31 (×2): 2 [IU] via SUBCUTANEOUS
  Administered 2017-01-02: 3 [IU] via SUBCUTANEOUS
  Administered 2017-01-07 – 2017-01-11 (×3): 2 [IU] via SUBCUTANEOUS
  Administered 2017-01-11 – 2017-01-14 (×3): 3 [IU] via SUBCUTANEOUS
  Filled 2016-12-29 (×2): qty 2
  Filled 2016-12-29 (×4): qty 1
  Filled 2016-12-29: qty 2

## 2016-12-29 MED ORDER — TRAZODONE HCL 100 MG PO TABS
100.0000 mg | ORAL_TABLET | Freq: Every evening | ORAL | Status: DC | PRN
  Administered 2016-12-31 – 2017-01-02 (×3): 100 mg via ORAL
  Filled 2016-12-29 (×3): qty 1

## 2016-12-29 MED ORDER — METFORMIN HCL 500 MG PO TABS
1000.0000 mg | ORAL_TABLET | Freq: Two times a day (BID) | ORAL | Status: DC
  Administered 2016-12-29 – 2017-01-15 (×34): 1000 mg via ORAL
  Filled 2016-12-29 (×34): qty 2

## 2016-12-29 MED ORDER — METHOCARBAMOL 500 MG PO TABS: 500.0000 mg | ORAL_TABLET | Freq: Four times a day (QID) | ORAL | Status: DC | PRN

## 2016-12-29 MED ORDER — LORAZEPAM 2 MG PO TABS
ORAL_TABLET | ORAL | Status: AC
  Filled 2016-12-29: qty 1

## 2016-12-29 MED ORDER — OXYCODONE-ACETAMINOPHEN 5-325 MG PO TABS: 1.0000 | ORAL_TABLET | ORAL | Status: DC | PRN

## 2016-12-29 MED ORDER — LITHIUM CARBONATE ER 450 MG PO TBCR
450.0000 mg | EXTENDED_RELEASE_TABLET | Freq: Two times a day (BID) | ORAL | Status: DC
  Administered 2016-12-30 – 2017-01-08 (×19): 450 mg via ORAL
  Filled 2016-12-29 (×19): qty 1

## 2016-12-29 MED ORDER — LITHIUM CARBONATE ER 450 MG PO TBCR
450.0000 mg | EXTENDED_RELEASE_TABLET | Freq: Two times a day (BID) | ORAL | Status: DC
  Administered 2016-12-29: 450 mg via ORAL
  Filled 2016-12-29 (×2): qty 1

## 2016-12-29 MED ORDER — ACETAMINOPHEN 325 MG PO TABS
650.0000 mg | ORAL_TABLET | Freq: Four times a day (QID) | ORAL | Status: DC | PRN
  Administered 2016-12-31 – 2017-01-01 (×2): 650 mg via ORAL
  Filled 2016-12-29 (×2): qty 2

## 2016-12-29 MED ORDER — LORAZEPAM 2 MG PO TABS
2.0000 mg | ORAL_TABLET | ORAL | Status: AC
  Administered 2016-12-29: 2 mg via ORAL

## 2016-12-29 MED ORDER — AMLODIPINE BESYLATE 5 MG PO TABS
10.0000 mg | ORAL_TABLET | Freq: Every day | ORAL | Status: DC
  Filled 2016-12-29: qty 1

## 2016-12-29 MED ORDER — RISPERIDONE 1 MG PO TABS
2.0000 mg | ORAL_TABLET | Freq: Two times a day (BID) | ORAL | Status: DC
  Administered 2016-12-29: 2 mg via ORAL
  Filled 2016-12-29: qty 2

## 2016-12-29 MED ORDER — INSULIN DETEMIR 100 UNIT/ML ~~LOC~~ SOLN: 75.0000 [IU] | Freq: Two times a day (BID) | SUBCUTANEOUS | Status: DC

## 2016-12-29 MED ORDER — CLONAZEPAM 0.5 MG PO TABS: 0.5000 mg | ORAL_TABLET | Freq: Three times a day (TID) | ORAL | Status: DC

## 2016-12-29 MED ORDER — AMLODIPINE BESYLATE 5 MG PO TABS: 10.0000 mg | ORAL_TABLET | Freq: Every day | ORAL | Status: DC

## 2016-12-29 MED ORDER — METHOCARBAMOL 500 MG PO TABS
500.0000 mg | ORAL_TABLET | Freq: Four times a day (QID) | ORAL | Status: DC | PRN
  Administered 2016-12-31 – 2017-01-01 (×2): 500 mg via ORAL
  Filled 2016-12-29 (×2): qty 1

## 2016-12-29 NOTE — Progress Notes (Signed)
[facility] after reporting suicidal thoughts not able to have medication. Patient reported his sister was going to help, but after talking to their dad she refused to pay for medication and has been off of psychiatric medications for several days. Patient denies any SI/HI/AH/VH, but does experience auditory and visual hallucinations intermittently. Patient states that the images or  like shadows and the auditory is like various sounds. Patient reported pain in feet and wanted Neurontin 300 mg and received the medication per MD order. Patient is alert and oriented x 4, breathing unlabored, and extremities x 4 within normal limits. Patient is calm and cooperative, but slightly anxious by fidgeting and moving around in assessment. Patient did not display any disruptive behavior. Patient was oriented to unit and room. Patient ate dinner and was ready to lay down. Will continue to monitor patient and notify MD of any changes.

## 2016-12-29 NOTE — ED Triage Notes (Addendum)
Says is out of meds.  Has no money, and he feels suicidal.   Says he was thinking of using an exacto knife.

## 2016-12-29 NOTE — ED Notes (Signed)
Gave patient his lunch tray.

## 2016-12-29 NOTE — ED Notes (Addendum)
[facility] unavailable to take patient at this time.

## 2016-12-29 NOTE — ED Notes (Signed)
[facility] by Dr. Toni Amendlapacs.  Attending Physician will be Dr. Ardyth HarpsHernandez.   [facility] AlphaPhyllis.   Intake Paper Work has been signed and placed on patient chart.  ER staff is aware of the admission Rennie Natter( GlendaER Sect.; ER MD; Kara MeadEmma Patient's Nurse & Albin Fellingarla Patient Access).

## 2016-12-29 NOTE — ED Notes (Addendum)
Spoke with Tressia MinersBlenda Booker in regards to patient placement. States she will call me back. Tiffany, RN and Berniceollyn, RN notified and aware of placement issues.
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
## 2016-12-29 NOTE — ED Notes (Signed)
Minerva AreolaEric, BHU RN is requesting patient to stay over in quad until moved to behavioral unit down stairs. Consulting civil engineerCharge RN notified. Charge RN refuses to speak with Minerva AreolaEric. This RN called Minerva Areolaric back to inform him patient needs moved at this time. States he will call me back.

## 2016-12-29 NOTE — Progress Notes (Signed)
CH received a order requisition for patient. CH went to the behavorial health to inquire if the patient had requested a CH. CH was advised that a CH was not needed   12/29/16 1605  Clinical Encounter Type  Visited With Health care provider  Visit Type Initial  Referral From Nurse  Consult/Referral To Chaplain  Spiritual Encounters  Spiritual Needs Other (Comment)
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
## 2016-12-29 NOTE — ED Notes (Signed)
Patient moving to behavioral Med room 24A.

## 2016-12-29 NOTE — ED Notes (Signed)
Dr. Clappas at bedside. 

## 2016-12-29 NOTE — ED Notes (Signed)
Nicole from intake called and states she will be up to speak with patient.

## 2016-12-30 DIAGNOSIS — F250 Schizoaffective disorder, bipolar type: Principal | ICD-10-CM

## 2016-12-30 LAB — GLUCOSE, CAPILLARY
Glucose-Capillary: 108 mg/dL — ABNORMAL HIGH (ref 65–99)
Glucose-Capillary: 103 mg/dL — ABNORMAL HIGH (ref 65–99)
Glucose-Capillary: 135 mg/dL — ABNORMAL HIGH (ref 65–99)
Glucose-Capillary: 118 mg/dL — ABNORMAL HIGH (ref 65–99)

## 2016-12-30 LAB — HEMOGLOBIN A1C
Hgb A1c MFr Bld: 5.4 % (ref 4.8–5.6)
Mean Plasma Glucose: 108 mg/dL

## 2016-12-30 LAB — LIPID PANEL
Cholesterol: 148 mg/dL (ref 0–200)
HDL: 42 mg/dL (ref >40)
LDL Cholesterol: 71 mg/dL (ref 0–99)
Total CHOL/HDL Ratio: 3.5 RATIO
Triglycerides: 174 mg/dL — ABNORMAL HIGH (ref <150)
VLDL: 35 mg/dL (ref 0–40)

## 2016-12-30 LAB — TSH: TSH: 0.911 u[IU]/mL (ref 0.350–4.500)
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
## 2016-12-30 NOTE — Progress Notes (Signed)
First am patient presents with flat affect.  Rates depression as a 8/10.  As day progressed affect brightens and patient verbalizes that he feels better. Support and encouragement offered.  Safety maintained.

## 2016-12-30 NOTE — [facility] Group Notes (Signed)
[facility] LCSW Group Therapy  12/30/2016 3:13 PM  Type of Therapy:  Group Therapy  Participation Level:  Patient did not attend group. CSW invited patient to group.   Summary of Progress/Problems: Boundaries: Patients defined boundaries and discussed the importance having clear boundaries within their relationships. Patients identified their own boundaries. Patients established limits and rules within relationships both personally and professionally. Patients discussed ways to create and/ or improve their personal boundaries and utilizing assertive communications skills.   [name] MSW, LCSWA 12/30/2016, 3:13 PM

## 2016-12-30 NOTE — Plan of Care (Signed)
Problem: Coping: Goal: Ability to cope will improve Outcome: Progressing Patient was cooperative on contact this evening.  He denies SI/HI/AVH and contracts for safety.  His stated goal this evening was to go to bed early.  Mood was calm this evening.
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
## 2016-12-30 NOTE — [facility] Group Notes (Signed)
[facility] Group Notes:  (Nursing/MHT/Case Management/Adjunct)  Date:  12/30/2016  Time:  11:30 PM  Type of Therapy:  Psychoeducational Skills  Participation Level:  Active  Participation Quality:  Appropriate and Attentive  Affect:  Appropriate  Cognitive:  Appropriate  Insight:  Appropriate  Engagement in Group:  Engaged  Modes of Intervention:  Discussion, Socialization and Support  Summary of Progress/Problems:  Chancy MilroyLaquanda Y Noel Henandez 12/30/2016, 11:30 PM

## 2016-12-31 LAB — GLUCOSE, CAPILLARY
GLUCOSE-CAPILLARY: 135 mg/dL — AB (ref 65–99)
GLUCOSE-CAPILLARY: 92 mg/dL (ref 65–99)
GLUCOSE-CAPILLARY: 154 mg/dL — AB (ref 65–99)
Glucose-Capillary: 89 mg/dL (ref 65–99)

## 2016-12-31 NOTE — [facility] Suicide Risk Assessment (Signed)
[facility] INPATIENT:  Family/Significant Other Suicide Prevention Education  Suicide Prevention Education:  Education Completed;[name](neighbor/friend [phone]),  (name of family member/significant other) has been identified by the patient as the family member/significant other with whom the patient will be residing, and identified as the person(s) who will aid the patient in the event of a mental health crisis (suicidal ideations/suicide attempt).  With written consent from the patient, the family member/significant other has been provided the following suicide prevention education, prior to the and/or following the discharge of the patient. Miss Maxwell RoughHicks stated she would be able to assist patient with transportation at discharge.   The suicide prevention education provided includes the following:  Suicide risk factors  Suicide prevention and interventions  National Suicide Hotline telephone number  [facility] assessment telephone number  [facility] Assistance 911  [facility] and/or Residential Mobile Crisis Unit telephone number  Request made of family/significant other to:  Remove weapons (e.g., guns, rifles, knives), all items previously/currently identified as safety concern.    Remove drugs/medications (over-the-counter, prescriptions, illicit drugs), all items previously/currently identified as a safety concern.  The family member/significant other verbalizes understanding of the suicide prevention education information provided.  The family member/significant other agrees to remove the items of safety concern listed above.  [name] MSW, LCSWA 12/31/2016, 9:49 AM

## 2016-12-31 NOTE — Plan of Care (Signed)
Problem: Coping: Goal: Ability to cope will improve Outcome: Progressing Patient was observed in the dayroom with peers and attended "wrap up " group.  He was assertive in asking for medications to help him sleep and later in requesting medication for back pain.  He reported back pain of 7/10 at 2300 and received Robaxin and Tylenol. He also asked questions about possible discharge and was able to review what brought him in for this admission and why he feels less depressed.  He denies thoughts of self harm and contracts for safety.

## 2016-12-31 NOTE — Plan of Care (Signed)
Problem: Medication: Goal: Compliance with prescribed medication regimen will improve Outcome: Progressing Medication compliant   
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
## 2016-12-31 NOTE — Progress Notes (Signed)
Patient visible in the milieu interacting with peers and staff. Patient stated that he was still depressed but felt as though he was getting better overall. He also mentioned that he felt hopeless and anxious. Patient stated that his goal for the day was to go home.  He said that he would meet his goal by being compliant with his Tx. Plan. Patient remained medication compliant. Medication education including Insulin discussed with patient. He verbalized understanding and returned demonstration. He said that he gives himself the Insulin at home. Patient encouraged to verbalize any thoughts and/or concerns to nursing staff.
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
## 2017-01-01 DIAGNOSIS — M549 Dorsalgia, unspecified: Secondary | ICD-10-CM

## 2017-01-01 DIAGNOSIS — G8929 Other chronic pain: Secondary | ICD-10-CM

## 2017-01-01 DIAGNOSIS — J449 Chronic obstructive pulmonary disease, unspecified: Secondary | ICD-10-CM

## 2017-01-01 DIAGNOSIS — E785 Hyperlipidemia, unspecified: Secondary | ICD-10-CM

## 2017-01-01 LAB — GLUCOSE, CAPILLARY
GLUCOSE-CAPILLARY: 108 mg/dL — AB (ref 65–99)
Glucose-Capillary: 103 mg/dL — ABNORMAL HIGH (ref 65–99)
Glucose-Capillary: 100 mg/dL — ABNORMAL HIGH (ref 65–99)
Glucose-Capillary: 135 mg/dL — ABNORMAL HIGH (ref 65–99)

## 2017-01-01 LAB — PROLACTIN: PROLACTIN: 21.1 ng/mL — AB (ref 4.0–15.2)

## 2017-01-01 MED ORDER — TRAMADOL HCL 50 MG PO TABS
50.0000 mg | ORAL_TABLET | Freq: Four times a day (QID) | ORAL | Status: DC | PRN
  Administered 2017-01-01: 50 mg via ORAL
  Filled 2017-01-01 (×2): qty 1

## 2017-01-01 MED ORDER — GABAPENTIN 400 MG PO CAPS
400.0000 mg | ORAL_CAPSULE | Freq: Three times a day (TID) | ORAL | Status: DC
  Administered 2017-01-01 – 2017-01-02 (×2): 400 mg via ORAL
  Filled 2017-01-01 (×2): qty 1

## 2017-01-01 MED ORDER — TRAMADOL HCL 50 MG PO TABS
100.0000 mg | ORAL_TABLET | Freq: Once | ORAL | Status: AC
  Administered 2017-01-01: 100 mg via ORAL
  Filled 2017-01-01: qty 2

## 2017-01-01 MED ORDER — ACETAMINOPHEN 500 MG PO TABS
1000.0000 mg | ORAL_TABLET | Freq: Four times a day (QID) | ORAL | Status: DC | PRN
  Administered 2017-01-06 – 2017-01-14 (×8): 1000 mg via ORAL
  Filled 2017-01-01 (×8): qty 2

## 2017-01-01 MED ORDER — CITALOPRAM HYDROBROMIDE 20 MG PO TABS
20.0000 mg | ORAL_TABLET | Freq: Every day | ORAL | Status: DC
  Administered 2017-01-02: 20 mg via ORAL
  Filled 2017-01-01: qty 1

## 2017-01-01 MED ORDER — LORAZEPAM 2 MG PO TABS
2.0000 mg | ORAL_TABLET | Freq: Every day | ORAL | Status: DC
  Administered 2017-01-01 – 2017-01-02 (×2): 2 mg via ORAL
  Filled 2017-01-01 (×2): qty 1

## 2017-01-01 MED ORDER — LIDOCAINE 5 % EX PTCH
1.0000 | MEDICATED_PATCH | CUTANEOUS | Status: DC
  Administered 2017-01-01 – 2017-01-13 (×7): 1 via TRANSDERMAL
  Filled 2017-01-01 (×15): qty 1

## 2017-01-01 MED ORDER — PALIPERIDONE ER 3 MG PO TB24
9.0000 mg | ORAL_TABLET | Freq: Every day | ORAL | Status: DC
  Administered 2017-01-01: 9 mg via ORAL
  Filled 2017-01-01: qty 3

## 2017-01-01 MED ORDER — LIDOCAINE 5 % EX PTCH
1.0000 | MEDICATED_PATCH | Freq: Once | CUTANEOUS | Status: AC
Start: 2017-01-01 — End: 2017-01-02
  Administered 2017-01-01: 1 via TRANSDERMAL
  Filled 2017-01-01: qty 1

## 2017-01-01 MED ORDER — METHOCARBAMOL 750 MG PO TABS
750.0000 mg | ORAL_TABLET | Freq: Three times a day (TID) | ORAL | Status: DC
  Administered 2017-01-01 – 2017-01-15 (×56): 750 mg via ORAL
  Filled 2017-01-01 (×34): qty 1
  Filled 2017-01-01: qty 1.5
  Filled 2017-01-01 (×22): qty 1
  Filled 2017-01-01: qty 2
  Filled 2017-01-01 (×5): qty 1

## 2017-01-01 NOTE — Tx Team (Signed)
Interdisciplinary Treatment and Diagnostic Plan Update  01/01/2017 Time of Session: 11:00 AM ZAYN SELLEY MRN: [id number]  Principal Diagnosis: Schizoaffective disorder, bipolar type (HCC)  Secondary Diagnoses: Principal Problem:   Schizoaffective disorder, bipolar type (HCC) Active Problems:   Diabetes (HCC)   OBESITY   OBSTRUCTIVE SLEEP APNEA   Hypertension   Hyperlipemia   COPD (chronic obstructive pulmonary disease) (HCC)   Chronic back pain   Current Medications:  Current Facility-Administered Medications  Medication Dose Route Frequency Provider Last Rate Last Dose  . acetaminophen (TYLENOL) tablet 1,000 mg  1,000 mg Oral Q6H PRN Jimmy Footman, MD      . alum & mag hydroxide-simeth (MAALOX/MYLANTA) 200-200-20 MG/5ML suspension 30 mL  30 mL Oral Q4H PRN Clapacs, John T, MD      . amLODipine (NORVASC) tablet 10 mg  10 mg Oral Daily Clapacs, Jackquline Denmark, MD   10 mg at 01/01/17 0831  . [START ON 01/02/2017] citalopram (CELEXA) tablet 20 mg  20 mg Oral Daily Jimmy Footman, MD      . gabapentin (NEURONTIN) capsule 400 mg  400 mg Oral TID Jimmy Footman, MD      . insulin aspart (novoLOG) injection 0-15 Units  0-15 Units Subcutaneous TID WC Clapacs, Jackquline Denmark, MD   2 Units at 12/31/16 1241  . insulin detemir (LEVEMIR) injection 35 Units  35 Units Subcutaneous BID Clapacs, Jackquline Denmark, MD   35 Units at 01/01/17 0831  . lidocaine (LIDODERM) 5 % 1 patch  1 patch Transdermal Q24H Jimmy Footman, MD   1 patch at 01/01/17 1416  . lithium carbonate (ESKALITH) CR tablet 450 mg  450 mg Oral Q12H Clapacs, Jackquline Denmark, MD   450 mg at 01/01/17 0831  . LORazepam (ATIVAN) tablet 2 mg  2 mg Oral QHS Hernandez-Gonzalez, Sue Lush, MD      . magnesium hydroxide (MILK OF MAGNESIA) suspension 30 mL  30 mL Oral Daily PRN Clapacs, John T, MD      . metFORMIN (GLUCOPHAGE) tablet 1,000 mg  1,000 mg Oral BID WC Clapacs, Jackquline Denmark, MD   1,000 mg at 01/01/17 0831  . methocarbamol  (ROBAXIN) tablet 750 mg  750 mg Oral TID PC & HS Jimmy Footman, MD   750 mg at 01/01/17 1239  . paliperidone (INVEGA) 24 hr tablet 9 mg  9 mg Oral QHS Jimmy Footman, MD      . traMADol Janean Sark) tablet 50 mg  50 mg Oral Q6H PRN Jimmy Footman, MD      . traZODone (DESYREL) tablet 100 mg  100 mg Oral QHS PRN Clapacs, Jackquline Denmark, MD   100 mg at 12/31/16 2244   PTA Medications: Prescriptions Prior to Admission  Medication Sig Dispense Refill Last Dose  . amLODipine (NORVASC) 5 MG tablet Take 2 tablets (10 mg total) by mouth daily. 60 tablet 5 12/29/2016 at 1000  . citalopram (CELEXA) 40 MG tablet Take 40 mg by mouth daily.  0 12/28/2016 at 2000  . clonazePAM (KLONOPIN) 0.5 MG tablet Take 1 tablet (0.5 mg total) by mouth daily as needed.   Past Month at Unknown time  . gabapentin (NEURONTIN) 300 MG capsule Take 300 mg by mouth 3 (three) times daily.  0 Past Month at Unknown time  . ibuprofen (ADVIL,MOTRIN) 800 MG tablet Take 1 tablet (800 mg total) by mouth every 8 (eight) hours as needed. (Patient not taking: Reported on 12/29/2016) 30 tablet 0 Completed Course at Unknown time  . insulin detemir (LEVEMIR) 100 UNIT/ML injection  Inject 35 Units into the skin 2 (two) times daily.    12/29/2016 at 0800  . lisinopril (PRINIVIL,ZESTRIL) 20 MG tablet Take 2 tablets (40 mg total) by mouth at bedtime.   Past Month at Unknown time  . lithium (ESKALITH) 450 MG CR tablet Take 450 mg by mouth 2 (two) times daily.    Past Week at Unknown time  . metFORMIN (GLUCOPHAGE) 1000 MG tablet Take 1 tablet (1,000 mg total) by mouth 2 (two) times daily with a meal. 180 tablet 1 Past Week at Unknown time  . methocarbamol (ROBAXIN) 500 MG tablet Take 1 tablet (500 mg total) by mouth every 6 (six) hours as needed for muscle spasms. (Patient not taking: Reported on 12/29/2016) 30 tablet 0 Completed Course at Unknown time  . oxyCODONE-acetaminophen (ROXICET) 5-325 MG tablet Take 1-2 tablets by mouth every 4  (four) hours as needed for severe pain. (Patient not taking: Reported on 12/29/2016) 15 tablet 0 Completed Course at Unknown time  . risperiDONE (RISPERDAL) 2 MG tablet Take 2 mg by mouth at bedtime.    12/26/2016 at 2000  . varenicline (CHANTIX CONTINUING MONTH PAK) 1 MG tablet Take 1 tablet (1 mg total) by mouth 2 (two) times daily. (Patient not taking: Reported on 12/29/2016) 60 tablet 1 Not Taking at Unknown time    Patient Stressors:    Patient Strengths:    Treatment Modalities: Medication Management, Group therapy, Case management,  1 to 1 session with clinician, Psychoeducation, Recreational therapy.   Physician Treatment Plan for Primary Diagnosis: Schizoaffective disorder, bipolar type (HCC) Long Term Goal(s): Improvement in symptoms so as ready for discharge Improvement in symptoms so as ready for discharge   Short Term Goals: Ability to identify changes in lifestyle to reduce recurrence of condition will improve Ability to verbalize feelings will improve Ability to disclose and discuss suicidal ideas Ability to demonstrate self-control will improve Ability to identify and develop effective coping behaviors will improve Ability to maintain clinical measurements within normal limits will improve Compliance with prescribed medications will improve Ability to identify triggers associated with substance abuse/mental health issues will improve Ability to identify changes in lifestyle to reduce recurrence of condition will improve Ability to verbalize feelings will improve Ability to disclose and discuss suicidal ideas Ability to demonstrate self-control will improve Ability to identify and develop effective coping behaviors will improve Ability to maintain clinical measurements within normal limits will improve Compliance with prescribed medications will improve Ability to identify triggers associated with substance abuse/mental health issues will improve  Medication Management:  Evaluate patient's response, side effects, and tolerance of medication regimen.  Therapeutic Interventions: 1 to 1 sessions, Unit Group sessions and Medication administration.  Evaluation of Outcomes: Progressing  Physician Treatment Plan for Secondary Diagnosis: Principal Problem:   Schizoaffective disorder, bipolar type (HCC) Active Problems:   Diabetes (HCC)   OBESITY   OBSTRUCTIVE SLEEP APNEA   Hypertension   Hyperlipemia   COPD (chronic obstructive pulmonary disease) (HCC)   Chronic back pain  Long Term Goal(s): Improvement in symptoms so as ready for discharge Improvement in symptoms so as ready for discharge   Short Term Goals: Ability to identify changes in lifestyle to reduce recurrence of condition will improve Ability to verbalize feelings will improve Ability to disclose and discuss suicidal ideas Ability to demonstrate self-control will improve Ability to identify and develop effective coping behaviors will improve Ability to maintain clinical measurements within normal limits will improve Compliance with prescribed medications will improve Ability to identify  triggers associated with substance abuse/mental health issues will improve Ability to identify changes in lifestyle to reduce recurrence of condition will improve Ability to verbalize feelings will improve Ability to disclose and discuss suicidal ideas Ability to demonstrate self-control will improve Ability to identify and develop effective coping behaviors will improve Ability to maintain clinical measurements within normal limits will improve Compliance with prescribed medications will improve Ability to identify triggers associated with substance abuse/mental health issues will improve     Medication Management: Evaluate patient's response, side effects, and tolerance of medication regimen.  Therapeutic Interventions: 1 to 1 sessions, Unit Group sessions and Medication administration.  Evaluation of  Outcomes: Progressing   RN Treatment Plan for Primary Diagnosis: Schizoaffective disorder, bipolar type (HCC) Long Term Goal(s): Knowledge of disease and therapeutic regimen to maintain health will improve  Short Term Goals: Ability to participate in decision making will improve, Ability to disclose and discuss suicidal ideas and Compliance with prescribed medications will improve  Medication Management: RN will administer medications as ordered by provider, will assess and evaluate patient's response and provide education to patient for prescribed medication. RN will report any adverse and/or side effects to prescribing provider.  Therapeutic Interventions: 1 on 1 counseling sessions, Psychoeducation, Medication administration, Evaluate responses to treatment, Monitor vital signs and CBGs as ordered, Perform/monitor CIWA, COWS, AIMS and Fall Risk screenings as ordered, Perform wound care treatments as ordered.  Evaluation of Outcomes: Progressing   LCSW Treatment Plan for Primary Diagnosis: Schizoaffective disorder, bipolar type (HCC) Long Term Goal(s): Safe transition to appropriate next level of care at discharge, Engage patient in therapeutic group addressing interpersonal concerns.  Short Term Goals: Engage patient in aftercare planning with referrals and resources, Increase social support and Increase emotional regulation  Therapeutic Interventions: Assess for all discharge needs, 1 to 1 time with Social worker, Explore available resources and support systems, Assess for adequacy in community support network, Educate family and significant other(s) on suicide prevention, Complete Psychosocial Assessment, Interpersonal group therapy.  Evaluation of Outcomes: Progressing   Progress in Treatment: Attending groups: Yes. Participating in groups: Yes. Taking medication as prescribed: Yes. Toleration medication: Yes. Family/Significant other contact made: Yes, friend/neighbor Patient  understands diagnosis: Yes. Discussing patient identified problems/goals with staff: Yes. Medical problems stabilized or resolved: Yes. Denies suicidal/homicidal ideation: Yes. Issues/concerns per patient self-inventory: No.   New problem(s) identified: No, Describe:  None  New Short Term/Long Term Goal(s): Patient stated that his goal is to "discharge home."  Discharge Plan or Barriers: Patient will discharge home and follow-up with outpatient provider.  Reason for Continuation of Hospitalization: Depression Hallucinations Medication stabilization  Estimated Length of Stay: 3-5 days   Attendees: Patient: Ramiro HarvestDaniel Viviano 01/01/2017 2:25 PM  Physician: Dr. Radene JourneyAndrea Hernandez, MD  01/01/2017 2:25 PM  Nursing:  01/01/2017 2:25 PM  RN Care Manager: 01/01/2017 2:25 PM  Social Worker: Hampton AbbotKadijah Tywanna Seifer, MSW, LCSW-A 01/01/2017 2:25 PM  Recreational Therapist: Princella IonElizabeth Greene, LRT, CTRS  01/01/2017 2:25 PM  Other:  01/01/2017 2:25 PM  Other:  01/01/2017 2:25 PM  Other: 01/01/2017 2:25 PM    Scribe for Treatment Team: Lynden OxfordKadijah R Nuria Phebus, LCSWA 01/01/2017 2:25 PM

## 2017-01-01 NOTE — [facility] Group Notes (Signed)
[facility] Group Notes:  (Nursing/MHT/Case Management/Adjunct)  Date:  01/01/2017  Time:  11:27 PM  Type of Therapy:  Psychoeducational Skills  Participation Level:  Did Not Attend  Foy GuadalajaraJasmine R Burr Soffer 01/01/2017, 11:27 PM

## 2017-01-01 NOTE — Progress Notes (Signed)
Affect brighter.  Verbalizes that his mood is better. Denies SI/HI/ AVH.  Verbalizes that having trouble controlling his paint.  States that sitting in the hard chairs and the bed makes his pain worse.  States that walking is what helps him to relive his pain.   Support and encouragement offered.  Safety maintained.

## 2017-01-01 NOTE — [facility] Group Notes (Signed)
[facility] Group Notes:  (Nursing/MHT/Case Management/Adjunct)  Date:  01/01/2017  Time:  4:54 AM  Type of Therapy:  Group Therapy  Participation Level:  Active  Participation Quality:  Appropriate  Affect:  Appropriate  Cognitive:  Appropriate  Insight:  Appropriate  Engagement in Group:  Engaged  Modes of Intervention:  n/a  Summary of Progress/Problems:  Veva Holesshley Imani Kennya Schwenn 01/01/2017, 4:54 AM

## 2017-01-01 NOTE — [facility] Group Notes (Addendum)
[facility] LCSW Group Therapy   01/01/2017 1:00 pm Type of Therapy: Group Therapy   Participation Level: Active   Participation Quality: Attentive, Sharing and Supportive   Affect: Appropriate   Cognitive: Alert and Oriented   Insight: Developing/Improving and Engaged   Engagement in Therapy: Developing/Improving and Engaged   Modes of Intervention: Clarification, Confrontation, Discussion, Education, Exploration,  Limit-setting, Orientation, Problem-solving, Rapport Building, Dance movement psychotherapisteality Testing, Socialization and Support   Summary of Progress/Problems: Pt identified obstacles faced currently and processed barriers involved in overcoming these obstacles. Pt identified steps necessary for overcoming these obstacles and explored motivation (internal and external) for facing these difficulties head on. Pt further identified one area of concern in their lives and chose a goal to focus on for today.   Maxwell AbbotKadijah Ronni Hamilton, MSW, LCSW-A 01/01/2017,2:46PM

## 2017-01-01 NOTE — Progress Notes (Signed)
Recreation Therapy Notes  INPATIENT RECREATION THERAPY ASSESSMENT  Patient Details Name: Maxwell EgeDaniel J Capelli MRN: [id number] DOB: 07-08-1966 Today's Date: 01/01/2017  Patient Stressors: Family (Patient reports his family are a bunch of rats and they could care less about him)  Coping Skills:   Isolate, Arguments, Exercise, Art/Dance, Music, Sports  Personal Challenges: Anger, Communication, Concentration, Self-Esteem/Confidence, Relationships, Social Interaction, Stress Management, Trusting Others  Leisure Interests (2+):  Art - Paint, Individual - Other (Comment) (Spend time with daughter)  Awareness of Community Resources:  Yes  Community Resources:  [employer], [place]  Current Use: No  If no, Barriers?: Other (Comment) (Daughter is in school and when school gets out they go to the park more)  Patient Strengths:  Daughter  Patient Identified Areas of Improvement:  Stress, relationships, trust  Current Recreation Participation:  Nothing  Patient Goal for Hospitalization:  To get back on his feet  [place]  [place] Residence:  [place]   Current SI (including self-harm):  No  Current HI:  No  Consent to Intern Participation: N/A   Jacquelynn CreeGreene,Flynt Breeze M, LRT/CTRS 01/01/2017, 3:26 PM
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
## 2017-01-01 NOTE — Progress Notes (Signed)
Recreation Therapy Notes  Date: 06.04.18 Time: 9:30 am Location: Craft Room  Group Topic: Self-expression  Goal Area(s) Addresses:  Patient will be able to identify a color that represents each emotion. Patient will verbalize benefit of using art as a means of self-expression. Patient will verbalize one emotion experienced while participating in activity.  Behavioral Response: Attentive, Interactive  Intervention: The Colors Within Me  Activity: Patients were given a blank face worksheet and were instructed to pick a color for each emotion they were feeling and show on the worksheet how much of that emotion they were feeling.  Education: LRT educated patients on other forms of self-expression.  Education Outcome: Acknowledges education/In group clarification offered   Clinical Observations/Feedback: Patient picked a color for each emotion he was feeling and showed on the worksheet how much of that emotion he was feeling. Patient contributed to group discussion by stating how he can maintain feeling positive.  Jacquelynn CreeGreene,Chance Karam M, LRT/CTRS 01/01/2017 10:25 AM

## 2017-01-02 LAB — GLUCOSE, CAPILLARY
GLUCOSE-CAPILLARY: 110 mg/dL — AB (ref 65–99)
GLUCOSE-CAPILLARY: 124 mg/dL — AB (ref 65–99)
GLUCOSE-CAPILLARY: 163 mg/dL — AB (ref 65–99)
GLUCOSE-CAPILLARY: 105 mg/dL — AB (ref 65–99)

## 2017-01-02 MED ORDER — PALIPERIDONE ER 3 MG PO TB24
12.0000 mg | ORAL_TABLET | Freq: Every day | ORAL | Status: DC
  Administered 2017-01-02 – 2017-01-14 (×13): 12 mg via ORAL
  Filled 2017-01-02 (×13): qty 4

## 2017-01-02 MED ORDER — TRAMADOL HCL 50 MG PO TABS
100.0000 mg | ORAL_TABLET | Freq: Three times a day (TID) | ORAL | Status: DC
  Administered 2017-01-02 – 2017-01-15 (×39): 100 mg via ORAL
  Filled 2017-01-02 (×39): qty 2

## 2017-01-02 MED ORDER — GABAPENTIN 300 MG PO CAPS
600.0000 mg | ORAL_CAPSULE | Freq: Three times a day (TID) | ORAL | Status: DC
  Administered 2017-01-02 – 2017-01-08 (×18): 600 mg via ORAL
  Filled 2017-01-02 (×18): qty 2
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
## 2017-01-02 NOTE — Progress Notes (Signed)
Denies SI/HI/AVH.  Rates depression and anxiety as 7/10.  Medication and group compliant.  Visible in milieu.  Interacting apropriately with peers and staff. Support offered.  Safety maintained.

## 2017-01-02 NOTE — Progress Notes (Signed)
Due to back pain.  Patient is unable to sleep in his bed.  Patient vomited earlier in the shift because of his pain level.  Patient only slept 15 min last night.

## 2017-01-02 NOTE — Plan of Care (Signed)
Problem: [facility] Participation in Recreation Therapeutic Interventions Goal: STG-Patient will demonstrate improved self esteem by identif STG: Self-Esteem - Within 4 treatment sessions, patient will verbalize at least 5 positive affirmation statements in each of 2 treatment sessions to increase self-esteem.  Outcome: Progressing Treatment Session 1; Completed 1 out of 2: At approximately 1:55 pm, LRT met with patient in consultation room. Patient verbalized 5 positive affirmation statements. Patient reported it felt "good". LRT encouraged patient to continue saying positive affirmation statements.  Leonette Monarch, LRT/CTRS 06.05.18 4:32 pm Goal: STG-Other Recreation Therapy Goal (Specify) STG: Stress Management - Within 4 treatment sessions, patient will verbalize understanding of the stress management techniques in each of 2 treatment sessions to increase stress management skills.  Outcome: Progressing Treatment Session 1; Completed 1 out of 2: At approximately 1:55 pm, LRT met with patient in consultation room. LRT educated and provided patient with handouts on stress management techniques. Patient verbalized understanding. LRT encouraged patient to read over and practice the stress management techniques.  Leonette Monarch, LRT/CTRS 06.05.18 4:33 pm
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
## 2017-01-02 NOTE — Progress Notes (Signed)
Recreation Therapy Notes  Date: 06.05.18 Time: 9:30 am Location: Craft Room  Group Topic: Coping Skills  Goal Area(s) Addresses:  Patient will write healthy coping skills. Patient will verbalize benefit of using healthy coping skills.  Behavioral Response: Attentive, Interactive  Intervention: Coping Skills Alphabet  Activity: Patients were given a Naval architectCoping Skills Alphabet worksheet and were instructed to write healthy coping skills for each letter of the alphabet.  Education: LRT educated patients on healthy coping skills.  Education Outcome: Acknowledges education/In group clarification offered   Clinical Observations/Feedback: Patient arrived to group at approximately 9:50 am. LRT explained activity. Patient wrote healthy coping skills and patient wrote things like "victorious" and "young forever". Patient contributed to group discussion by stating some of his healthy coping skills and what his mind is focused on while he uses his healthy coping skills.  Jacquelynn CreeGreene,Karson Reede M, LRT/CTRS 01/02/2017 10:21 AM

## 2017-01-03 LAB — GLUCOSE, CAPILLARY
GLUCOSE-CAPILLARY: 108 mg/dL — AB (ref 65–99)
Glucose-Capillary: 79 mg/dL (ref 65–99)
Glucose-Capillary: 86 mg/dL (ref 65–99)
Glucose-Capillary: 96 mg/dL (ref 65–99)

## 2017-01-03 MED ORDER — TRAZODONE HCL 50 MG PO TABS
150.0000 mg | ORAL_TABLET | Freq: Every day | ORAL | Status: DC
  Administered 2017-01-03 – 2017-01-05 (×3): 150 mg via ORAL
  Filled 2017-01-03 (×3): qty 1

## 2017-01-03 MED ORDER — LORAZEPAM 1 MG PO TABS: 1.0000 mg | ORAL_TABLET | Freq: Every day | ORAL | Status: DC

## 2017-01-03 MED ORDER — PALIPERIDONE PALMITATE 234 MG/1.5ML IM SUSP
234.0000 mg | Freq: Once | INTRAMUSCULAR | Status: AC
  Administered 2017-01-03: 234 mg via INTRAMUSCULAR
  Filled 2017-01-03: qty 1.5

## 2017-01-03 NOTE — Progress Notes (Signed)
D: Pt denies SI/HI/AVH. Pt is irritable and angry towards nursing staff.  Pt rated depression a 6/10 on a scale  of 0-10, he appears less anxious and he is not interacting appropriately with peers and staff. .  A: Pt was offered support and encouragement. Pt was given scheduled medications. Pt was encouraged to attend groups. Q 15 minute checks were done for safety.  R:Pt did not attend evening group. Pt is taking medication. Patient is not receptive to treatment and safety maintained on unit.

## 2017-01-03 NOTE — Progress Notes (Signed)
Verbalizes that he feels much better today. States that pain is better controlled and was able to sleep in bed on his back.  Further states that he is catching up on his sleep.  Visible in the milieu.  Interacting appropriately with peers and staff. Support and encouragement offered.  Safety maintained.
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
## 2017-01-03 NOTE — Progress Notes (Signed)
Recreation Therapy Notes  Date: 06.06.18 Time: 9:30 am Location: Craft Room  Group Topic: Self-esteem  Goal Area(s) Addresses: Patient will write at least one positive trait about self. Patient will verbalize benefit of having a healthy self-esteem.   Behavioral Response: Did not attend  Intervention: I Am   Activity: Patients were given a worksheet with the letter I on it and were instructed to write as many positive traits about themselves inside the letter.  Education: LRT educated patients on ways to increase their self-esteem.   Education Outcome: Patient did not attend group.  Clinical Observations/Feedback: Patient did not attend group.  Jacquelynn CreeGreene,Loucile Posner M, LRT/CTRS 01/03/2017 9:59 AM

## 2017-01-03 NOTE — Plan of Care (Signed)
Problem: Coping: Goal: Ability to verbalize feelings will improve Outcome: Not Progressing Patient is guarded not verbalizing feelings to staff.

## 2017-01-04 LAB — GLUCOSE, CAPILLARY
GLUCOSE-CAPILLARY: 108 mg/dL — AB (ref 65–99)
Glucose-Capillary: 119 mg/dL — ABNORMAL HIGH (ref 65–99)
Glucose-Capillary: 100 mg/dL — ABNORMAL HIGH (ref 65–99)
Glucose-Capillary: 91 mg/dL (ref 65–99)

## 2017-01-04 LAB — LITHIUM LEVEL: LITHIUM LVL: 0.6 mmol/L (ref 0.60–1.20)
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
## 2017-01-04 NOTE — Plan of Care (Signed)
Problem: [facility] Participation in Recreation Therapeutic Interventions Goal: STG-Patient will demonstrate improved self esteem by identif STG: Self-Esteem - Within 4 treatment sessions, patient will verbalize at least 5 positive affirmation statements in each of 2 treatment sessions to increase self-esteem.  Outcome: Completed/Met Date Met: 01/04/17 Treatment Session 2; Completed 2 out of 2: At approximately 10:15 am, LRT met with patient in patient room. Patient verbalized 5 positive affirmation statements. Patient reported it felt "good". LRT encouraged patient to continue saying positive affirmation statements.  Leonette Monarch, LRT/CTRS 06.07.18 3:01 pm Goal: STG-Other Recreation Therapy Goal (Specify) STG: Stress Management - Within 4 treatment sessions, patient will verbalize understanding of the stress management techniques in each of 2 treatment sessions to increase stress management skills.  Outcome: Completed/Met Date Met: 01/04/17 Treatment Session 2; Completed 2 out of 2: At approximately 10:15 am, LRT met with patient in patient room. Patient reported he read over and practiced some of the stress management techniques. Patient verbalized understanding and reported the techniques were helpful. LRT encouraged patient to continue practicing the stress management techniques.  Leonette Monarch, LRT/CTRS 06.07.18 3:05 pm

## 2017-01-04 NOTE — [facility] Group Notes (Signed)
[facility] LCSW Group Therapy  01/04/2017 2:24 PM  Type of Therapy:  Group Therapy  Participation Level:  Patient did not attend group. CSW invited patient to group.   Summary of Progress/Problems: Balance in life: Patients will discuss the concept of balance and how it looks and feels to be unbalanced. Pt will identify areas in their life that is unbalanced and ways to become more balanced. They discussed what aspects in their lives has influenced their self care. Patients also discussed self care in the areas of self regulation/control, hygiene/appearance, sleep/relaxation, healthy leisure, healthy eating habits, exercise, inner peace/spirituality, self improvement, sobriety, and health management. They were challenged to identify changes that are needed in order to improve self care.  Cornelius Schuitema G. Garnette CzechSampson MSW, LCSWA 01/04/2017, 2:24 PM

## 2017-01-04 NOTE — Progress Notes (Signed)
Recreation Therapy Notes  Date: 06.07.18 Time: 9:30 am Location: Craft Room  Group Topic: Leisure Education  Goal Area(s) Addresses:  Patient will identify things they are grateful for. Patient will identify how being grateful can influence decision making.  Behavioral Response: Did not attend  Intervention: Grateful Wheel  Activity: Patients were given an I Am Grateful For worksheet and were instructed to write things they are grateful for under each category.   Education: LRT educated patients on leisure.  Education Outcome: Patient did not attend group.   Clinical Observations/Feedback: Patient did not attend group.  Jacquelynn CreeGreene,Kalonji Zurawski M, LRT/CTRS 01/04/2017 10:09 AM

## 2017-01-04 NOTE — Plan of Care (Signed)
Problem: Coping: Goal: Ability to cope will improve Outcome: Progressing Pt more appropriate on unit, pleasant and cooperative. Denies SI/HI/AVH. Verbalizes feelings.

## 2017-01-04 NOTE — [facility] Group Notes (Signed)
  [facility] LCSW Group Therapy Note  Date/Time: 01/03/17, 1300 late entry note  Type of Therapy/Topic:  Group Therapy:  Emotion Regulation  Participation Level:  Active   Mood: pleasant  Description of Group:    The purpose of this group is to assist patients in learning to regulate negative emotions and experience positive emotions. Patients will be guided to discuss ways in which they have been vulnerable to their negative emotions. These vulnerabilities will be juxtaposed with experiences of positive emotions or situations, and patients challenged to use positive emotions to combat negative ones. Special emphasis will be placed on coping with negative emotions in conflict situations, and patients will process healthy conflict resolution skills.  Therapeutic Goals: 1. Patient will identify two positive emotions or experiences to reflect on in order to balance out negative emotions:  2. Patient will label two or more emotions that they find the most difficult to experience:  3. Patient will be able to demonstrate positive conflict resolution skills through discussion or role plays:   Summary of Patient Progress: Pt was very active in group and made a number of contributions to the discussion.  Pt identified anger and anxiety as emotions that are difficult to experience and participated in discussion regarding was to handle strong emotions in order to avoid emotional explosions.       Therapeutic Modalities:   Cognitive Behavioral Therapy Feelings Identification Dialectical Behavioral Therapy  Daleen SquibbGreg Deserai Cansler, LCSW

## 2017-01-04 NOTE — Progress Notes (Signed)
Pt awake, alert, oriented and up on unit today. Pleasant, calm, cooperative, smiles on approach. Reports improvement in mood, less depression. Complains of lower back pain which is chronic, but pt voices satisfaction with medication regimen ordered at this time. Pt spends most of his day, sitting up in a chair with pillow support. Denies SI/HI/AVH. Pt was noted to be sleeping in the chair, bedside on approach at one point today. Encouraged pt to get into bed to reduce fall risk, but pt reports he is most comfortable in chair. Reports good sleep last night although it is reported he slept 4 hours. Reports good appetite, normal energy, good concentration. Rates depression 3/10, hopelessness 3/10, anxiety 2/10 (low 0-10 high). Goal today is "to be a nice person" by "follow through with treatment plan." PT is medication compliant. Appropriately interacts with staff/peers. Support and encouragement provided. Medications administered as ordered with education. Safety  maintained with every 15 minute checks. Will continue to monitor.

## 2017-01-04 NOTE — [facility] Group Notes (Signed)
[facility] LCSW Group Therapy Note  Type of Therapy and Topic:  Group Therapy:  Goals Group: SMART Goals   Participation Level:  Patient did not attend group. CSW invited patient to group.   Description of Group:   The purpose of a daily goals group is to assist and guide patients in setting recovery/wellness-related goals.  The objective is to set goals as they relate to the crisis in which they were admitted. Patients will be using SMART goal modalities to set measurable goals.  Characteristics of realistic goals will be discussed and patients will be assisted in setting and processing how one will reach their goal. Facilitator will also assist patients in applying interventions and coping skills learned in psycho-education groups to the SMART goal and process how one will achieve defined goal.  Therapeutic Goals: -Patients will develop and document one goal related to or their crisis in which brought them into treatment. -Patients will be guided by LCSW using SMART goal setting modality in how to set a measurable, attainable, realistic and time sensitive goal.  -Patients will process barriers in reaching goal. -Patients will process interventions in how to overcome and successful in reaching goal.   Summary of Patient Progress:  Patient Goal: Patient did not attend group. CSW invited patient to group.    Therapeutic Modalities:   Motivational Interviewing Engineer, manufacturing systemsCognitive Behavioral Therapy Crisis Intervention Model SMART goals setting  Kaitlynne Wenz G. Garnette CzechSampson MSW, LCSWA 01/04/2017 11:05 AM

## 2017-01-04 NOTE — Progress Notes (Signed)
D: Pt denies SI/HI/AVH. Pt is pleasant and cooperative, affect is flat but brightens upon approach, denies discomfort.Pt appears less anxious and he is interacting with peers and staff appropriately.  A: Pt was offered support and encouragement. Pt was given scheduled medications. Pt was encouraged to attend groups. Q 15 minute checks were done for safety.  R:Pt attends groups and interacts well with peers and staff. Pt is taking medication. Pt has no complaints.Pt receptive to treatment and safety maintained on unit.

## 2017-01-04 NOTE — Plan of Care (Signed)
Problem: Coping: Goal: Ability to verbalize feelings will improve Outcome: Progressing Patient verbalized feelings to staff.    

## 2017-01-05 LAB — GLUCOSE, CAPILLARY
GLUCOSE-CAPILLARY: 117 mg/dL — AB (ref 65–99)
GLUCOSE-CAPILLARY: 103 mg/dL — AB (ref 65–99)
Glucose-Capillary: 84 mg/dL (ref 65–99)
Glucose-Capillary: 93 mg/dL (ref 65–99)

## 2017-01-05 MED ORDER — INSULIN DETEMIR 100 UNIT/ML ~~LOC~~ SOLN: 35.0000 [IU] | Freq: Two times a day (BID) | SUBCUTANEOUS | 0 refills | Status: DC

## 2017-01-05 MED ORDER — PALIPERIDONE ER 6 MG PO TB24: 12.0000 mg | ORAL_TABLET | Freq: Every day | ORAL | 0 refills | Status: DC

## 2017-01-05 MED ORDER — GABAPENTIN 300 MG PO CAPS: 600.0000 mg | ORAL_CAPSULE | Freq: Three times a day (TID) | ORAL | 0 refills | Status: DC

## 2017-01-05 MED ORDER — TRAZODONE HCL 150 MG PO TABS: 150.0000 mg | ORAL_TABLET | Freq: Every day | ORAL | 0 refills | Status: DC

## 2017-01-05 MED ORDER — METFORMIN HCL 1000 MG PO TABS: 1000.0000 mg | ORAL_TABLET | Freq: Two times a day (BID) | ORAL | 0 refills | Status: AC

## 2017-01-05 MED ORDER — AMLODIPINE BESYLATE 10 MG PO TABS: 10.0000 mg | ORAL_TABLET | Freq: Every day | ORAL | 0 refills | Status: DC

## 2017-01-05 MED ORDER — LITHIUM CARBONATE ER 450 MG PO TBCR: 450.0000 mg | EXTENDED_RELEASE_TABLET | Freq: Two times a day (BID) | ORAL | 0 refills | Status: DC

## 2017-01-05 MED ORDER — PALIPERIDONE PALMITATE 156 MG/ML IM SUSP
156.0000 mg | Freq: Once | INTRAMUSCULAR | Status: AC
  Administered 2017-01-08: 156 mg via INTRAMUSCULAR
  Filled 2017-01-05: qty 1

## 2017-01-05 MED ORDER — METHOCARBAMOL 750 MG PO TABS: 750.0000 mg | ORAL_TABLET | Freq: Three times a day (TID) | ORAL | 0 refills | Status: AC | PRN
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
## 2017-01-05 NOTE — Progress Notes (Signed)
D: Pt denies SI/HI/AVH, affect is flat but brightens upon approach.  Pt is pleasant and cooperative. Pt stated he feels better from resting and taking his medication. Patient appears less anxious and he is interacting with peers and staff appropriately.  A: Pt was offered support and encouragement. Pt was given scheduled medications. Pt was encouraged to attend groups. Q 15 minute checks were done for safety.  R:Pt attends groups and interacts well with peers and staff. Pt is taking medication. Pt has no complaints.Pt receptive to treatment and safety maintained on unit.

## 2017-01-05 NOTE — Plan of Care (Signed)
Problem: Coping: Goal: Ability to verbalize feelings will improve Outcome: Progressing Patient verbalized feelings to staff.    

## 2017-01-05 NOTE — Tx Team (Signed)
Interdisciplinary Treatment and Diagnostic Plan Update  01/05/2017 Time of Session: 11:00 AM [name] MRN: [id number]  Principal Diagnosis: Schizoaffective disorder, bipolar type (HCC)  Secondary Diagnoses: Principal Problem:   Schizoaffective disorder, bipolar type (HCC) Active Problems:   Diabetes (HCC)   OBESITY   OBSTRUCTIVE SLEEP APNEA   Hypertension   Hyperlipemia   COPD (chronic obstructive pulmonary disease) (HCC)   Chronic back pain   Current Medications:  Current Facility-Administered Medications  Medication Dose Route Frequency Provider Last Rate Last Dose  . acetaminophen (TYLENOL) tablet 1,000 mg  1,000 mg Oral Q6H PRN Jimmy FootmanHernandez-Gonzalez, Andrea, MD      . alum & mag hydroxide-simeth (MAALOX/MYLANTA) 200-200-20 MG/5ML suspension 30 mL  30 mL Oral Q4H PRN Clapacs, John T, MD      . amLODipine (NORVASC) tablet 10 mg  10 mg Oral Daily Clapacs, Jackquline DenmarkJohn T, MD   10 mg at 01/05/17 0815  . gabapentin (NEURONTIN) capsule 600 mg  600 mg Oral TID Jimmy FootmanHernandez-Gonzalez, Andrea, MD   600 mg at 01/05/17 1209  . insulin aspart (novoLOG) injection 0-15 Units  0-15 Units Subcutaneous TID WC Clapacs, Jackquline DenmarkJohn T, MD   3 Units at 01/02/17 1744  . insulin detemir (LEVEMIR) injection 35 Units  35 Units Subcutaneous BID Clapacs, Jackquline DenmarkJohn T, MD   35 Units at 01/05/17 [id number]  . lidocaine (LIDODERM) 5 % 1 patch  1 patch Transdermal Q24H Jimmy FootmanHernandez-Gonzalez, Andrea, MD   1 patch at 01/04/17 1138  . lithium carbonate (ESKALITH) CR tablet 450 mg  450 mg Oral Q12H Clapacs, Jackquline DenmarkJohn T, MD   450 mg at 01/05/17 0844  . magnesium hydroxide (MILK OF MAGNESIA) suspension 30 mL  30 mL Oral Daily PRN Clapacs, John T, MD      . metFORMIN (GLUCOPHAGE) tablet 1,000 mg  1,000 mg Oral BID WC Clapacs, Jackquline DenmarkJohn T, MD   1,000 mg at 01/05/17 0815  . methocarbamol (ROBAXIN) tablet 750 mg  750 mg Oral TID PC & HS Jimmy FootmanHernandez-Gonzalez, Andrea, MD   750 mg at 01/05/17 1209  . [START ON 01/08/2017] paliperidone (INVEGA SUSTENNA) injection 156  mg  156 mg Intramuscular Once Jimmy FootmanHernandez-Gonzalez, Andrea, MD      . paliperidone (INVEGA) 24 hr tablet 12 mg  12 mg Oral QHS Jimmy FootmanHernandez-Gonzalez, Andrea, MD   12 mg at 01/04/17 2151  . traMADol (ULTRAM) tablet 100 mg  100 mg Oral TID Jimmy FootmanHernandez-Gonzalez, Andrea, MD   100 mg at 01/05/17 1209  . traZODone (DESYREL) tablet 150 mg  150 mg Oral QHS Jimmy FootmanHernandez-Gonzalez, Andrea, MD   150 mg at 01/04/17 2151   PTA Medications: Prescriptions Prior to Admission  Medication Sig Dispense Refill Last Dose  . amLODipine (NORVASC) 5 MG tablet Take 2 tablets (10 mg total) by mouth daily. 60 tablet 5 12/29/2016 at 1000  . citalopram (CELEXA) 40 MG tablet Take 40 mg by mouth daily.  0 12/28/2016 at 2000  . clonazePAM (KLONOPIN) 0.5 MG tablet Take 1 tablet (0.5 mg total) by mouth daily as needed.   Past Month at Unknown time  . gabapentin (NEURONTIN) 300 MG capsule Take 300 mg by mouth 3 (three) times daily.  0 Past Month at Unknown time  . ibuprofen (ADVIL,MOTRIN) 800 MG tablet Take 1 tablet (800 mg total) by mouth every 8 (eight) hours as needed. (Patient not taking: Reported on 12/29/2016) 30 tablet 0 Completed Course at Unknown time  . insulin detemir (LEVEMIR) 100 UNIT/ML injection Inject 35 Units into the skin 2 (two) times daily.  12/29/2016 at 0800  . lisinopril (PRINIVIL,ZESTRIL) 20 MG tablet Take 2 tablets (40 mg total) by mouth at bedtime.   Past Month at Unknown time  . lithium (ESKALITH) 450 MG CR tablet Take 450 mg by mouth 2 (two) times daily.    Past Week at Unknown time  . metFORMIN (GLUCOPHAGE) 1000 MG tablet Take 1 tablet (1,000 mg total) by mouth 2 (two) times daily with a meal. 180 tablet 1 Past Week at Unknown time  . methocarbamol (ROBAXIN) 500 MG tablet Take 1 tablet (500 mg total) by mouth every 6 (six) hours as needed for muscle spasms. (Patient not taking: Reported on 12/29/2016) 30 tablet 0 Completed Course at Unknown time  . oxyCODONE-acetaminophen (ROXICET) 5-325 MG tablet Take 1-2 tablets by  mouth every 4 (four) hours as needed for severe pain. (Patient not taking: Reported on 12/29/2016) 15 tablet 0 Completed Course at Unknown time  . risperiDONE (RISPERDAL) 2 MG tablet Take 2 mg by mouth at bedtime.    12/26/2016 at 2000  . varenicline (CHANTIX CONTINUING MONTH PAK) 1 MG tablet Take 1 tablet (1 mg total) by mouth 2 (two) times daily. (Patient not taking: Reported on 12/29/2016) 60 tablet 1 Not Taking at Unknown time    Patient Stressors:    Patient Strengths:    Treatment Modalities: Medication Management, Group therapy, Case management,  1 to 1 session with clinician, Psychoeducation, Recreational therapy.   Physician Treatment Plan for Primary Diagnosis: Schizoaffective disorder, bipolar type (HCC) Long Term Goal(s): Improvement in symptoms so as ready for discharge Improvement in symptoms so as ready for discharge   Short Term Goals: Ability to identify changes in lifestyle to reduce recurrence of condition will improve Ability to verbalize feelings will improve Ability to disclose and discuss suicidal ideas Ability to demonstrate self-control will improve Ability to identify and develop effective coping behaviors will improve Ability to maintain clinical measurements within normal limits will improve Compliance with prescribed medications will improve Ability to identify triggers associated with substance abuse/mental health issues will improve Ability to identify changes in lifestyle to reduce recurrence of condition will improve Ability to verbalize feelings will improve Ability to disclose and discuss suicidal ideas Ability to demonstrate self-control will improve Ability to identify and develop effective coping behaviors will improve Ability to maintain clinical measurements within normal limits will improve Compliance with prescribed medications will improve Ability to identify triggers associated with substance abuse/mental health issues will improve  Medication  Management: Evaluate patient's response, side effects, and tolerance of medication regimen.  Therapeutic Interventions: 1 to 1 sessions, Unit Group sessions and Medication administration.  Evaluation of Outcomes: Progressing  Physician Treatment Plan for Secondary Diagnosis: Principal Problem:   Schizoaffective disorder, bipolar type (HCC) Active Problems:   Diabetes (HCC)   OBESITY   OBSTRUCTIVE SLEEP APNEA   Hypertension   Hyperlipemia   COPD (chronic obstructive pulmonary disease) (HCC)   Chronic back pain  Long Term Goal(s): Improvement in symptoms so as ready for discharge Improvement in symptoms so as ready for discharge   Short Term Goals: Ability to identify changes in lifestyle to reduce recurrence of condition will improve Ability to verbalize feelings will improve Ability to disclose and discuss suicidal ideas Ability to demonstrate self-control will improve Ability to identify and develop effective coping behaviors will improve Ability to maintain clinical measurements within normal limits will improve Compliance with prescribed medications will improve Ability to identify triggers associated with substance abuse/mental health issues will improve Ability to identify changes  in lifestyle to reduce recurrence of condition will improve Ability to verbalize feelings will improve Ability to disclose and discuss suicidal ideas Ability to demonstrate self-control will improve Ability to identify and develop effective coping behaviors will improve Ability to maintain clinical measurements within normal limits will improve Compliance with prescribed medications will improve Ability to identify triggers associated with substance abuse/mental health issues will improve     Medication Management: Evaluate patient's response, side effects, and tolerance of medication regimen.  Therapeutic Interventions: 1 to 1 sessions, Unit Group sessions and Medication  administration.  Evaluation of Outcomes: Progressing   RN Treatment Plan for Primary Diagnosis: Schizoaffective disorder, bipolar type (HCC) Long Term Goal(s): Knowledge of disease and therapeutic regimen to maintain health will improve  Short Term Goals: Ability to participate in decision making will improve, Ability to disclose and discuss suicidal ideas and Compliance with prescribed medications will improve  Medication Management: RN will administer medications as ordered by provider, will assess and evaluate patient's response and provide education to patient for prescribed medication. RN will report any adverse and/or side effects to prescribing provider.  Therapeutic Interventions: 1 on 1 counseling sessions, Psychoeducation, Medication administration, Evaluate responses to treatment, Monitor vital signs and CBGs as ordered, Perform/monitor CIWA, COWS, AIMS and Fall Risk screenings as ordered, Perform wound care treatments as ordered.  Evaluation of Outcomes: Progressing   LCSW Treatment Plan for Primary Diagnosis: Schizoaffective disorder, bipolar type (HCC) Long Term Goal(s): Safe transition to appropriate next level of care at discharge, Engage patient in therapeutic group addressing interpersonal concerns.  Short Term Goals: Engage patient in aftercare planning with referrals and resources, Increase social support and Increase emotional regulation  Therapeutic Interventions: Assess for all discharge needs, 1 to 1 time with Social worker, Explore available resources and support systems, Assess for adequacy in community support network, Educate family and significant other(s) on suicide prevention, Complete Psychosocial Assessment, Interpersonal group therapy.  Evaluation of Outcomes: Progressing   Progress in Treatment: Attending groups: Yes. Participating in groups: Yes. Taking medication as prescribed: Yes. Toleration medication: Yes. Family/Significant other contact made:  Yes, friend/neighbor Patient understands diagnosis: Yes. Discussing patient identified problems/goals with staff: Yes. Medical problems stabilized or resolved: Yes. Denies suicidal/homicidal ideation: Yes. Issues/concerns per patient self-inventory: No.   New problem(s) identified: No, Describe:  None  New Short Term/Long Term Goal(s): Patient stated that his goal is to "discharge home."  Discharge Plan or Barriers: Patient will discharge home and follow-up with outpatient provider.  Reason for Continuation of Hospitalization: Depression Hallucinations Medication stabilization  Estimated Length of Stay: D/C 01/08/17   Attendees: Patient: [name] 01/05/2017 3:54 PM  Physician: Dr. Radene Journey, MD  01/05/2017 3:54 PM  Nursing: Hulan Amato, RN  01/05/2017 3:54 PM  RN Care Manager: 01/05/2017 3:54 PM  Social Worker: Hampton Abbot, MSW, LCSW-A 01/05/2017 3:54 PM  Recreational Therapist: 01/05/2017 3:54 PM  Other:  01/05/2017 3:54 PM  Other:  01/05/2017 3:54 PM  Other: 01/05/2017 3:54 PM    Scribe for Treatment Team: Lynden Oxford, LCSWA 01/05/2017 3:54 PM

## 2017-01-05 NOTE — [facility] Group Notes (Signed)
[facility] Group Notes:  (Nursing/MHT/Case Management/Adjunct)  Date:  01/05/2017  Time:  12:46 AM  Type of Therapy:  Group Therapy  Participation Level:  Did Not Attend  Participation Quality: Summary of Progress/Problems:  [name] 01/05/2017, 12:46 AM

## 2017-01-05 NOTE — [facility] Group Notes (Signed)
[facility] LCSW Group Therapy Note  Date/Time: 01/05/17, 1300  Type of Therapy and Topic:  Group Therapy:  Feelings around Relapse and Recovery  Participation Level:  Active   Mood: pleasant  Description of Group:    Patients in this group will discuss emotions they experience before and after a relapse. They will process how experiencing these feelings, or avoidance of experiencing them, relates to having a relapse. Facilitator will guide patients to explore emotions they have related to recovery. Patients will be encouraged to process which emotions are more powerful. They will be guided to discuss the emotional reaction significant others in their lives may have to patients' relapse or recovery. Patients will be assisted in exploring ways to respond to the emotions of others without this contributing to a relapse.  Therapeutic Goals: 1. Patient will identify two or more emotions that lead to relapse for them:  2. Patient will identify two emotions that result when they relapse:  3. Patient will identify two emotions related to recovery:  4. Patient will demonstrate ability to communicate their needs through discussion and/or role plays.   Summary of Patient Progress: Pt identified anxiety as an emotion that can lead to relapse.  Pt was active participant in group discussion regarding how relapse can be related to emotional regulation.     Therapeutic Modalities:   Cognitive Behavioral Therapy Solution-Focused Therapy Assertiveness Training Relapse Prevention Therapy  Daleen SquibbGreg Devaughn Savant, LCSW

## 2017-01-05 NOTE — Progress Notes (Signed)
Patient refused cpap 

## 2017-01-05 NOTE — Plan of Care (Signed)
Problem: Physical Regulation: Goal: Ability to maintain clinical measurements within normal limits will improve Outcome: Progressing Adherence to ordered CBGs during and after discharge reviewed, pt. Verbalized understanding

## 2017-01-05 NOTE — Progress Notes (Signed)
Pt. Calm, cooperative and pleasant.  Occasionally hyperverbal, happily discussing topics with this nurse such as astrological signs, books/authors r/t to this topic, etc.  Pt. Reports that his feet are swollen.  Upon inspection, no edema, redness or swelling observed.  Pt. Instructed to elevate feet as much as possible today and notify nurse if symptoms worsen or pain develops.  Verbalized understanding.  Education reviewed regarding CBGs - normal levels, times of testing.  Verbalized understanding.  Med-compliant.  Refuses lidocaine patch, does not feel that it works because it falls off.  Denies SI/HI/AVH.  No behavioral issues.   During evening, pt reports to nurse that he has lost approximately 70 pounds since Dec. 2016.  Pt explains extensively his strategy for this weight loss, including eating non-fat yogurt, canned fruits, tuna and rice.  Pt even finds nurse after this conversation ended to further elaborate on his diet, reporting that he has "mostly" cut out [employer] and [employer].
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
## 2017-01-06 LAB — GLUCOSE, CAPILLARY
GLUCOSE-CAPILLARY: 113 mg/dL — AB (ref 65–99)
GLUCOSE-CAPILLARY: 113 mg/dL — AB (ref 65–99)
GLUCOSE-CAPILLARY: 93 mg/dL (ref 65–99)
Glucose-Capillary: 107 mg/dL — ABNORMAL HIGH (ref 65–99)

## 2017-01-06 MED ORDER — TRAZODONE HCL 100 MG PO TABS
200.0000 mg | ORAL_TABLET | Freq: Every day | ORAL | Status: DC
  Administered 2017-01-06 – 2017-01-07 (×2): 200 mg via ORAL
  Filled 2017-01-06 (×2): qty 2
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
## 2017-01-06 NOTE — Progress Notes (Signed)
[facility] LCSW Group Therapy   01/06/2017  1 PM  Type of Therapy: Group Therapy   Participation Level: Did Not Attend. Patient invited to participate but declined.    Nasreen Goedecke F. Latisia Hilaire, MSW, LCSWA, LCAS     
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
## 2017-01-06 NOTE — Plan of Care (Signed)
Problem: Coping: Goal: Ability to cope will improve Outcome: Progressing Patient was observed in the dayroom with peers and he was friendly on approach by Clinical research associatewriter.  He participated in wrap up group and had snack with peers.  He initially refused to wear his C-Pap when approached by RT but then asked that she be called back for placement "as I am having some trouble winding down tonight".  Patient was friendly and talkative with staff.  He denies negative thoughts and contracts for safety.

## 2017-01-06 NOTE — Plan of Care (Signed)
Problem: Coping: Goal: Ability to cope will improve Outcome: Progressing Verbalized desire to better manage stressful situations at home after discharge
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
## 2017-01-06 NOTE — [facility] Group Notes (Signed)
[facility] Group Notes:  (Nursing/MHT/Case Management/Adjunct)  Date:  01/06/2017  Time:  2:28 AM  Type of Therapy:  Evening Wrap-up Group  Participation Level:  Did Not Attend  Participation Quality:  N/A  Affect:  N/A  Cognitive:  N/A  Insight:  None  Engagement in Group:  Did Not Attend  Modes of Intervention:  Activity  Summary of Progress/Problems:  Maxwell MorrowChelsea Nanta [name] 01/06/2017, 2:28 AM

## 2017-01-07 LAB — GLUCOSE, CAPILLARY
GLUCOSE-CAPILLARY: 73 mg/dL (ref 65–99)
Glucose-Capillary: 118 mg/dL — ABNORMAL HIGH (ref 65–99)
Glucose-Capillary: 126 mg/dL — ABNORMAL HIGH (ref 65–99)
Glucose-Capillary: 142 mg/dL — ABNORMAL HIGH (ref 65–99)

## 2017-01-07 NOTE — Plan of Care (Signed)
Problem: Activity: Goal: Interest or engagement in leisure activities will improve Outcome: Progressing Patient is more active in the patient evening activities.  He has good eye contact and initiates conversation with peers and staff.  He reports no pain this evening and stated getting the tylenol and Lidoderm patch this am was helpful.

## 2017-01-07 NOTE — [facility] Group Notes (Signed)
[facility] Group Notes: (Clinical Social Work)   01/07/2017      Type of Therapy:  Group Therapy   Participation Level:  Did Not Attend despite invitation   Ambrose MantleMareida Grossman-Orr, LCSW 01/07/2017, 3:06 PM

## 2017-01-07 NOTE — Plan of Care (Signed)
Problem: Health Behavior/Discharge Planning: Goal: Ability to manage health-related needs will improve Outcome: Progressing Reports he intends to check his blood sugar before meals and before bedtime at home now.  Reports he was not doing it consistently prior to admission.

## 2017-01-07 NOTE — Progress Notes (Signed)
Pt calm, cooperative and pleasant this morning.  Appropriately interacting with staff and peers.  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  Speaks clearly and intelligently.  Denies SI/HI/AVH.  Med compliant.  Complains of 6/10 back pain, PRN acetaminophen given at 1433.  No behavioral issues to report.
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
## 2017-01-07 NOTE — [facility] Group Notes (Signed)
[facility] Group Notes:  (Nursing/MHT/Case Management/Adjunct)  Date:  01/07/2017  Time:  11:27 PM  Type of Therapy:  Psychoeducational Skills  Participation Level:  Active  Participation Quality:  Appropriate, Attentive and Sharing  Affect:  Appropriate  Cognitive:  Oriented  Insight:  Good  Engagement in Group:  Engaged  Modes of Intervention:  Discussion  Summary of Progress/Problems:  [name] 01/07/2017, 11:27 PM

## 2017-01-08 LAB — GLUCOSE, CAPILLARY
GLUCOSE-CAPILLARY: 108 mg/dL — AB (ref 65–99)
GLUCOSE-CAPILLARY: 120 mg/dL — AB (ref 65–99)
GLUCOSE-CAPILLARY: 74 mg/dL (ref 65–99)
GLUCOSE-CAPILLARY: 147 mg/dL — AB (ref 65–99)

## 2017-01-08 MED ORDER — LORAZEPAM 0.5 MG PO TABS
0.2500 mg | ORAL_TABLET | Freq: Three times a day (TID) | ORAL | Status: DC
  Administered 2017-01-08 – 2017-01-10 (×6): 0.25 mg via ORAL
  Filled 2017-01-08 (×6): qty 1

## 2017-01-08 MED ORDER — TRAZODONE HCL 50 MG PO TABS
150.0000 mg | ORAL_TABLET | Freq: Every day | ORAL | Status: DC
  Administered 2017-01-08 – 2017-01-14 (×7): 150 mg via ORAL
  Filled 2017-01-08 (×8): qty 1

## 2017-01-08 MED ORDER — LITHIUM CARBONATE ER 300 MG PO TBCR
600.0000 mg | EXTENDED_RELEASE_TABLET | Freq: Two times a day (BID) | ORAL | Status: DC
  Administered 2017-01-08 – 2017-01-15 (×14): 600 mg via ORAL
  Filled 2017-01-08 (×10): qty 2
  Filled 2017-01-08: qty 1
  Filled 2017-01-08 (×4): qty 2

## 2017-01-08 MED ORDER — GABAPENTIN 400 MG PO CAPS
800.0000 mg | ORAL_CAPSULE | Freq: Three times a day (TID) | ORAL | Status: DC
  Administered 2017-01-08 – 2017-01-15 (×21): 800 mg via ORAL
  Filled 2017-01-08 (×21): qty 2

## 2017-01-08 MED ORDER — TRAMADOL HCL 50 MG PO TABS: 50.0000 mg | ORAL_TABLET | Freq: Four times a day (QID) | ORAL | 0 refills | Status: DC | PRN

## 2017-01-08 NOTE — Progress Notes (Signed)
Patient ID: Maxwell GammonDaniel Joseph Hamilton, male   DOB: 02-19-1966, 51 y.o.   MRN: [id number] Dr. And Pt agreed that he needed to stay a day or two more.  After meeting with Pt he became agitated and visibly upset in talking about returning home and the conflict he anticipates having there with some of the other residents.  CSW called his friend Hanley HaysKimber Hicks, [phone], who was planning to pick him up and notified her of the change. She says she will be available to pick him up when needed but requests as much notice as we can provide.  [name], LCSW

## 2017-01-08 NOTE — Plan of Care (Signed)
Problem: Activity: Goal: Interest or engagement in leisure activities will improve Outcome: Progressing [name] joined the group to go outside this evening and appeared to be enjoying conversation with peers.  BG was 73 right before snack and Patient was observed consuming a banana and crackers.  He complained of increased back pain afterward, 8/10, and requested tylenol.  He was observed sleeping within the hour.
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
## 2017-01-08 NOTE — [facility] Group Notes (Signed)
[facility] LCSW Group Therapy Note  Date/Time: 01/08/17, 0930  Type of Therapy and Topic:  Group Therapy:  Overcoming Obstacles  Participation Level:  Pt did not attend.  Description of Group:    In this group patients will be encouraged to explore what they see as obstacles to their own wellness and recovery. They will be guided to discuss their thoughts, feelings, and behaviors related to these obstacles. The group will process together ways to cope with barriers, with attention given to specific choices patients can make. Each patient will be challenged to identify changes they are motivated to make in order to overcome their obstacles. This group will be process-oriented, with patients participating in exploration of their own experiences as well as giving and receiving support and challenge from other group members.  Therapeutic Goals: 1. Patient will identify personal and current obstacles as they relate to admission. 2. Patient will identify barriers that currently interfere with their wellness or overcoming obstacles.  3. Patient will identify feelings, thought process and behaviors related to these barriers. 4. Patient will identify two changes they are willing to make to overcome these obstacles:    Summary of Patient Progress      Therapeutic Modalities:   Cognitive Behavioral Therapy Solution Focused Therapy Motivational Interviewing Relapse Prevention Therapy  Greg Janita Camberos, LCSW 

## 2017-01-08 NOTE — Progress Notes (Signed)
Denies SI/HI/AVH.  Became nervous and anxious prior to discharge and discharge had to be cancelled. Patient verbalized that he feels as though there are a couple of the neighbors in the apartment complex that is watching him and he cannot deal with that and he has to find other living arrangements.  No inappropriate behavior noted.  Visible in th milieu.  Support and encouragement offered.  Safety checks maintained.

## 2017-01-08 NOTE — Progress Notes (Signed)
Patient ID: Maxwell GammonDaniel Joseph Hamilton, male   DOB: 20-May-1966, 51 y.o.   MRN: [id number] CSW left voicemail with [facility] to confirm Pt appointment.  Pt says he thinks he has one scheduled for the end of the month, but is unsure of date.

## 2017-01-08 NOTE — [facility] Group Notes (Signed)
[facility] Group Notes:  (Nursing/MHT/Case Management/Adjunct)  Date:  01/08/2017  Time:  11:59 PM  Type of Therapy:  Group Therapy  Participation Level:  Active  Participation Quality:  Appropriate  Affect:  Appropriate  Cognitive:  Appropriate  Insight:  Appropriate  Engagement in Group:  Engaged  Modes of Intervention:  Discussion  Summary of Progress/Problems:  Burt EkJanice Marie Josi Roediger 01/08/2017, 11:59 PM

## 2017-01-09 LAB — GLUCOSE, CAPILLARY
GLUCOSE-CAPILLARY: 78 mg/dL (ref 65–99)
GLUCOSE-CAPILLARY: 89 mg/dL (ref 65–99)
Glucose-Capillary: 113 mg/dL — ABNORMAL HIGH (ref 65–99)
Glucose-Capillary: 101 mg/dL — ABNORMAL HIGH (ref 65–99)

## 2017-01-09 NOTE — [facility] Group Notes (Signed)
[facility] Group Notes:  (Nursing/MHT/Case Management/Adjunct)  Date:  01/09/2017  Time:  1:38 PM  Type of Therapy:  Psychoeducational Skills  Participation Level:  Did Not Attend  Lynelle SmokeCara Travis [facility]Dasia Guerrier 01/09/2017, 1:38 PM
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
## 2017-01-09 NOTE — Social Work (Signed)
CSW scheduled patient appointment for June 13th at 3:30PM. Patient is unable to discharge due to his current symptoms. CSW rescheduled appointment for June 25th at 3:00 PM. Patient may not be seen within 7 days of discharge - [facility] made aware of policy but was unable to schedule appointment sooner.   Hampton AbbotKadijah Nautia Lem, MSW, LCSW-A 01/09/2017, 10:34AM

## 2017-01-09 NOTE — Progress Notes (Signed)
D: Patient denies SI/HI/AVH, Pt is pleasant and cooperative, affect is flat but brightens upon approach. Pt stated appears less anxious and he is interacting with peers and staff appropriately, patient's thoughts are organized.  A: Pt was offered support and encouragement. Pt was given scheduled medications. Pt was encouraged to attend groups. Q 15 minute checks were done for safety.  R:Pt attends groups and interacts well with peers and staff. Pt is compliant with medication, Patient receptive to treatment and safety maintained on unit.

## 2017-01-09 NOTE — [facility] Group Notes (Signed)
[facility] LCSW Group Therapy  [facility] LCSW Group Therapy Note  Date/Time:01/09/2017  Type of Therapy/Topic:  Group Therapy:  Feelings about Diagnosis  Participation Level:  Active   Mood: good   Description of Group:    This group will allow patients to explore their thoughts and feelings about diagnoses they have received. Patients will be guided to explore their level of understanding and acceptance of these diagnoses. Facilitator will encourage patients to process their thoughts and feelings about the reactions of others to their diagnosis, and will guide patients in identifying ways to discuss their diagnosis with significant others in their lives. This group will be process-oriented, with patients participating in exploration of their own experiences as well as giving and receiving support and challenge from other group members.   Therapeutic Goals: 1. Patient will demonstrate understanding of diagnosis as evidence by identifying two or more symptoms of the disorder:  2. Patient will be able to express two feelings regarding the diagnosis 3. Patient will demonstrate ability to communicate their needs through discussion and/or role plays  Summary of Patient Progress:  Pt requiring some redirection at times not to get caught up in delusions/frustration with his living situation.  Pt otherwise insightful into his symptoms, triggers, and coping skills. Verbalizes the more information he's learned about his diagnosis the more stable he's been.    Therapeutic Modalities:   Cognitive Behavioral Therapy Brief Therapy Feelings Identification    [name] 01/09/2017, 5:24 PM

## 2017-01-09 NOTE — Plan of Care (Signed)
Problem: Coping: Goal: Ability to identify and develop effective coping behavior will improve Outcome: Not Progressing Remains paranoid and hyperfocused on neighbors, reporting they are out to get him.

## 2017-01-09 NOTE — Progress Notes (Signed)
Recreation Therapy Notes  Date: 06.12.18 Time: 9:30 am Location: Craft Room  Group Topic: Self-expression  Goal Area(s) Addresses:  Patient will identify one color per emotion listed on wheel. Patient will verbalize benefit of using art as a means of self-expression. Patient will verbalize one emotion experienced during session. Patient will be educated on other forms of self-expression.  Behavioral Response: Attentive, Interactive  Intervention: Emotion Wheel  Activity: Patients were given an Arboriculturistmotion Wheel worksheet and were instructed to pick a color for each emotion listed on the wheel.  Education: LRT educated patients on other forms of self-expression.  Education Outcome: Acknowledges education/In group clarification offered  Clinical Observations/Feedback: Patient was focused on his apartment and stating he felt that people were watching him. LRT redirected patient. Patient difficult to redirect as he brought it up again. LRT redirect patient again and patient complied. Patient picked colors for each emotion listed. Patient contributed to group discussion by stating what colors he picked, that it was helpful to see his emotions and why, and what emotions he felt during group.  Jacquelynn CreeGreene,Nanetta Wiegman M, LRT/CTRS 01/09/2017 10:37 AM
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
## 2017-01-10 LAB — GLUCOSE, CAPILLARY
GLUCOSE-CAPILLARY: 111 mg/dL — AB (ref 65–99)
GLUCOSE-CAPILLARY: 106 mg/dL — AB (ref 65–99)
Glucose-Capillary: 78 mg/dL (ref 65–99)

## 2017-01-10 LAB — BASIC METABOLIC PANEL
ANION GAP: 7 (ref 5–15)
BUN: 24 mg/dL — AB (ref 6–20)
CALCIUM: 9.2 mg/dL (ref 8.9–10.3)
CO2: 28 mmol/L (ref 22–32)
CREATININE: 1.03 mg/dL (ref 0.61–1.24)
Chloride: 97 mmol/L — ABNORMAL LOW (ref 101–111)
GFR calc Af Amer: >60 mL/min (ref >60)
Glucose, Bld: 82 mg/dL (ref 65–99)
Potassium: 3.7 mmol/L (ref 3.5–5.1)
Sodium: 132 mmol/L — ABNORMAL LOW (ref 135–145)

## 2017-01-10 MED ORDER — CLONAZEPAM 0.5 MG PO TABS
0.5000 mg | ORAL_TABLET | Freq: Three times a day (TID) | ORAL | Status: DC
  Administered 2017-01-10 – 2017-01-14 (×13): 0.5 mg via ORAL
  Filled 2017-01-10 (×13): qty 1

## 2017-01-10 NOTE — Plan of Care (Signed)
Problem: Education: Goal: Ability to state activities that reduce stress will improve Outcome: Progressing Patient was able to use deep breathing when stressed out.
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
## 2017-01-10 NOTE — Plan of Care (Signed)
Problem: Education: Goal: Ability to state activities that reduce stress will improve Outcome: Progressing Voices plan to manage anxiety for today (attend groups)

## 2017-01-10 NOTE — Progress Notes (Signed)
D: Patient denies SI/HI/AVH, Pt is pleasant and cooperative, affect is flat but brightens upon approach. Pt  appears less anxious and he is interacting with peers and staff appropriately, patient's thoughts are organized.  A: Pt was offered support and encouragement. Pt was given scheduled medications. Pt was encouraged to attend groups. Q 15 minute checks were done for safety.  R:Pt attends groups and interacts well with peers and staff. Pt is compliant with medication, Patient receptive to treatment and safety maintained on unit.

## 2017-01-10 NOTE — [facility] Group Notes (Signed)
[facility] LCSW Group Therapy  01/10/2017 1:51 PM  Type of Therapy:  Group Therapy  Participation Level:  Active  Participation Quality:  Appropriate and Sharing  Affect:  Appropriate  Cognitive:  Alert  Insight:  Improving  Engagement in Therapy:  Improving  Modes of Intervention:  Activity, Discussion, Education, Problem-solving, Reality Testing, Socialization and Support  Summary of Progress/Problems: Emotional Regulation: Patients will identify both negative and positive emotions. They will discuss emotions they have difficulty regulating and how they impact their lives. Patients will be asked to identify healthy coping skills to combat unhealthy reactions to negative emotions.   Raissa Dam G. Garnette CzechSampson MSW, LCSWA 01/10/2017, 1:51 PM

## 2017-01-10 NOTE — Progress Notes (Signed)
Recreation Therapy Notes  Date: 06.13.18 Time: 9:30 am Location: Craft Room  Group Topic: Self-esteem  Goal Area(s) Addresses:  Patient will write at least one positive trait about self. Patient will verbalize benefit of having a healthy self-esteem.  Behavioral Response: Attentive, Interactive  Intervention: I Am  Activity: Patients were given a worksheet with the letter I on it and were instructed to write as many positive traits inside the letter.  Education: LRT educated patients on ways to increase their self-esteem.  Education Outcome: Acknowledges education/In group clarification offered  Clinical Observations/Feedback: Patient wrote positive traits about self. Patient contributed to group discussion by stating what makes it difficult to think of positive traits, how his self-esteem affects him, and how he can increase his self-esteem.  Jacquelynn CreeGreene,Evalyn Shultis M, LRT/CTRS 01/10/2017 10:15 AM
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
## 2017-01-10 NOTE — Tx Team (Signed)
Interdisciplinary Treatment and Diagnostic Plan Update  01/10/2017 Time of Session: 10:30 AM [name] MRN: [id number]  Principal Diagnosis: Schizoaffective disorder, bipolar type (HCC)  Secondary Diagnoses: Principal Problem:   Schizoaffective disorder, bipolar type (HCC) Active Problems:   Diabetes (HCC)   OBESITY   OBSTRUCTIVE SLEEP APNEA   Hypertension   Hyperlipemia   COPD (chronic obstructive pulmonary disease) (HCC)   Chronic back pain   Current Medications:  Current Facility-Administered Medications  Medication Dose Route Frequency Provider Last Rate Last Dose  . acetaminophen (TYLENOL) tablet 1,000 mg  1,000 mg Oral Q6H PRN Jimmy Footman, MD   1,000 mg at 01/08/17 0701  . alum & mag hydroxide-simeth (MAALOX/MYLANTA) 200-200-20 MG/5ML suspension 30 mL  30 mL Oral Q4H PRN Clapacs, John T, MD      . amLODipine (NORVASC) tablet 10 mg  10 mg Oral Daily Clapacs, Jackquline Denmark, MD   10 mg at 01/10/17 7829  . gabapentin (NEURONTIN) capsule 800 mg  800 mg Oral TID Jimmy Footman, MD   800 mg at 01/10/17 5621  . insulin aspart (novoLOG) injection 0-15 Units  0-15 Units Subcutaneous TID WC Clapacs, Jackquline Denmark, MD   2 Units at 01/07/17 1654  . insulin detemir (LEVEMIR) injection 35 Units  35 Units Subcutaneous BID Clapacs, Jackquline Denmark, MD   35 Units at 01/10/17 [phone]  . lidocaine (LIDODERM) 5 % 1 patch  1 patch Transdermal Q24H Jimmy Footman, MD   1 patch at 01/08/17 1231  . lithium carbonate (LITHOBID) CR tablet 600 mg  600 mg Oral Q12H Jimmy Footman, MD   600 mg at 01/10/17 5784  . LORazepam (ATIVAN) tablet 0.25 mg  0.25 mg Oral TID Jimmy Footman, MD   0.25 mg at 01/10/17 6962  . magnesium hydroxide (MILK OF MAGNESIA) suspension 30 mL  30 mL Oral Daily PRN Clapacs, John T, MD      . metFORMIN (GLUCOPHAGE) tablet 1,000 mg  1,000 mg Oral BID WC Clapacs, Jackquline Denmark, MD   1,000 mg at 01/10/17 0821  . methocarbamol (ROBAXIN) tablet 750  mg  750 mg Oral TID PC & HS Jimmy Footman, MD   750 mg at 01/10/17 0821  . paliperidone (INVEGA) 24 hr tablet 12 mg  12 mg Oral QHS Jimmy Footman, MD   12 mg at 01/09/17 2205  . traMADol (ULTRAM) tablet 100 mg  100 mg Oral TID Jimmy Footman, MD   100 mg at 01/10/17 0820  . traZODone (DESYREL) tablet 150 mg  150 mg Oral QHS Jimmy Footman, MD   150 mg at 01/09/17 2205   PTA Medications: Prescriptions Prior to Admission  Medication Sig Dispense Refill Last Dose  . amLODipine (NORVASC) 5 MG tablet Take 2 tablets (10 mg total) by mouth daily. 60 tablet 5 12/29/2016 at 1000  . citalopram (CELEXA) 40 MG tablet Take 40 mg by mouth daily.  0 12/28/2016 at 2000  . clonazePAM (KLONOPIN) 0.5 MG tablet Take 1 tablet (0.5 mg total) by mouth daily as needed.   Past Month at Unknown time  . gabapentin (NEURONTIN) 300 MG capsule Take 300 mg by mouth 3 (three) times daily.  0 Past Month at Unknown time  . ibuprofen (ADVIL,MOTRIN) 800 MG tablet Take 1 tablet (800 mg total) by mouth every 8 (eight) hours as needed. (Patient not taking: Reported on 12/29/2016) 30 tablet 0 Completed Course at Unknown time  . insulin detemir (LEVEMIR) 100 UNIT/ML injection Inject 35 Units into the skin 2 (two) times daily.  12/29/2016 at 0800  . lisinopril (PRINIVIL,ZESTRIL) 20 MG tablet Take 2 tablets (40 mg total) by mouth at bedtime.   Past Month at Unknown time  . lithium (ESKALITH) 450 MG CR tablet Take 450 mg by mouth 2 (two) times daily.    Past Week at Unknown time  . metFORMIN (GLUCOPHAGE) 1000 MG tablet Take 1 tablet (1,000 mg total) by mouth 2 (two) times daily with a meal. 180 tablet 1 Past Week at Unknown time  . methocarbamol (ROBAXIN) 500 MG tablet Take 1 tablet (500 mg total) by mouth every 6 (six) hours as needed for muscle spasms. (Patient not taking: Reported on 12/29/2016) 30 tablet 0 Completed Course at Unknown time  . oxyCODONE-acetaminophen (ROXICET) 5-325 MG tablet Take  1-2 tablets by mouth every 4 (four) hours as needed for severe pain. (Patient not taking: Reported on 12/29/2016) 15 tablet 0 Completed Course at Unknown time  . risperiDONE (RISPERDAL) 2 MG tablet Take 2 mg by mouth at bedtime.    12/26/2016 at 2000  . varenicline (CHANTIX CONTINUING MONTH PAK) 1 MG tablet Take 1 tablet (1 mg total) by mouth 2 (two) times daily. (Patient not taking: Reported on 12/29/2016) 60 tablet 1 Not Taking at Unknown time    Patient Stressors:    Patient Strengths:    Treatment Modalities: Medication Management, Group therapy, Case management,  1 to 1 session with clinician, Psychoeducation, Recreational therapy.   Physician Treatment Plan for Primary Diagnosis: Schizoaffective disorder, bipolar type (HCC) Long Term Goal(s): Improvement in symptoms so as ready for discharge Improvement in symptoms so as ready for discharge   Short Term Goals: Ability to identify changes in lifestyle to reduce recurrence of condition will improve Ability to verbalize feelings will improve Ability to disclose and discuss suicidal ideas Ability to demonstrate self-control will improve Ability to identify and develop effective coping behaviors will improve Ability to maintain clinical measurements within normal limits will improve Compliance with prescribed medications will improve Ability to identify triggers associated with substance abuse/mental health issues will improve Ability to identify changes in lifestyle to reduce recurrence of condition will improve Ability to verbalize feelings will improve Ability to disclose and discuss suicidal ideas Ability to demonstrate self-control will improve Ability to identify and develop effective coping behaviors will improve Ability to maintain clinical measurements within normal limits will improve Compliance with prescribed medications will improve Ability to identify triggers associated with substance abuse/mental health issues will  improve  Medication Management: Evaluate patient's response, side effects, and tolerance of medication regimen.  Therapeutic Interventions: 1 to 1 sessions, Unit Group sessions and Medication administration.  Evaluation of Outcomes: Progressing  Physician Treatment Plan for Secondary Diagnosis: Principal Problem:   Schizoaffective disorder, bipolar type (HCC) Active Problems:   Diabetes (HCC)   OBESITY   OBSTRUCTIVE SLEEP APNEA   Hypertension   Hyperlipemia   COPD (chronic obstructive pulmonary disease) (HCC)   Chronic back pain  Long Term Goal(s): Improvement in symptoms so as ready for discharge Improvement in symptoms so as ready for discharge   Short Term Goals: Ability to identify changes in lifestyle to reduce recurrence of condition will improve Ability to verbalize feelings will improve Ability to disclose and discuss suicidal ideas Ability to demonstrate self-control will improve Ability to identify and develop effective coping behaviors will improve Ability to maintain clinical measurements within normal limits will improve Compliance with prescribed medications will improve Ability to identify triggers associated with substance abuse/mental health issues will improve Ability to identify changes  in lifestyle to reduce recurrence of condition will improve Ability to verbalize feelings will improve Ability to disclose and discuss suicidal ideas Ability to demonstrate self-control will improve Ability to identify and develop effective coping behaviors will improve Ability to maintain clinical measurements within normal limits will improve Compliance with prescribed medications will improve Ability to identify triggers associated with substance abuse/mental health issues will improve     Medication Management: Evaluate patient's response, side effects, and tolerance of medication regimen.  Therapeutic Interventions: 1 to 1 sessions, Unit Group sessions and Medication  administration.  Evaluation of Outcomes: Progressing   RN Treatment Plan for Primary Diagnosis: Schizoaffective disorder, bipolar type (HCC) Long Term Goal(s): Knowledge of disease and therapeutic regimen to maintain health will improve  Short Term Goals: Ability to participate in decision making will improve, Ability to disclose and discuss suicidal ideas and Compliance with prescribed medications will improve  Medication Management: RN will administer medications as ordered by provider, will assess and evaluate patient's response and provide education to patient for prescribed medication. RN will report any adverse and/or side effects to prescribing provider.  Therapeutic Interventions: 1 on 1 counseling sessions, Psychoeducation, Medication administration, Evaluate responses to treatment, Monitor vital signs and CBGs as ordered, Perform/monitor CIWA, COWS, AIMS and Fall Risk screenings as ordered, Perform wound care treatments as ordered.  Evaluation of Outcomes: Progressing   LCSW Treatment Plan for Primary Diagnosis: Schizoaffective disorder, bipolar type (HCC) Long Term Goal(s): Safe transition to appropriate next level of care at discharge, Engage patient in therapeutic group addressing interpersonal concerns.  Short Term Goals: Engage patient in aftercare planning with referrals and resources, Increase social support and Increase emotional regulation  Therapeutic Interventions: Assess for all discharge needs, 1 to 1 time with Social worker, Explore available resources and support systems, Assess for adequacy in community support network, Educate family and significant other(s) on suicide prevention, Complete Psychosocial Assessment, Interpersonal group therapy.  Evaluation of Outcomes: Progressing   Progress in Treatment: Attending groups: Yes. Participating in groups: Yes. Taking medication as prescribed: Yes. Toleration medication: Yes. Family/Significant other contact made:  Yes, friend/neighbor Patient understands diagnosis: Yes. Discussing patient identified problems/goals with staff: Yes. Medical problems stabilized or resolved: Yes. Denies suicidal/homicidal ideation: Yes. Issues/concerns per patient self-inventory: No.   New problem(s) identified: No, Describe:  None  New Short Term/Long Term Goal(s): Patient stated that his goal is to "discharge home."  Discharge Plan or Barriers: Patient will discharge home and follow-up with outpatient provider.  Reason for Continuation of Hospitalization: Depression Hallucinations Medication stabilization  Estimated Length of Stay: D/C 01/15/17   Attendees: Patient: [name] 01/10/2017 9:44 AM  Physician: Dr. Radene Journey, MD  01/10/2017 9:44 AM  Nursing: Hulan Amato, RN  01/10/2017 9:44 AM  RN Care Manager: 01/10/2017 9:44 AM  Social Worker: Hampton Abbot, MSW, LCSW-A 01/10/2017 9:44 AM  Recreational Therapist: Hershal Coria, LRT, CTRS  01/10/2017 9:44 AM  Other:  01/10/2017 9:44 AM  Other:  01/10/2017 9:44 AM  Other: 01/10/2017 9:44 AM    Scribe for Treatment Team: Lynden Oxford, LCSWA 01/10/2017 9:44 AM

## 2017-01-11 LAB — GLUCOSE, CAPILLARY
GLUCOSE-CAPILLARY: 126 mg/dL — AB (ref 65–99)
GLUCOSE-CAPILLARY: 117 mg/dL — AB (ref 65–99)
Glucose-Capillary: 79 mg/dL (ref 65–99)
Glucose-Capillary: 157 mg/dL — ABNORMAL HIGH (ref 65–99)

## 2017-01-11 MED ORDER — CITALOPRAM HYDROBROMIDE 20 MG PO TABS
20.0000 mg | ORAL_TABLET | Freq: Every day | ORAL | Status: DC
Start: 2017-01-11 — End: 2017-01-15
  Administered 2017-01-11 – 2017-01-15 (×5): 20 mg via ORAL
  Filled 2017-01-11 (×5): qty 1

## 2017-01-11 NOTE — [facility] Group Notes (Signed)
[facility] Group Notes:  (Nursing/MHT/Case Management/Adjunct)  Date:  01/11/2017  Time:  10:26 PM  Type of Therapy:  Psychoeducational Skills  Participation Level:  Did Not Attend   [name] 01/11/2017, 10:26 PM

## 2017-01-11 NOTE — [facility] Group Notes (Signed)
[facility] LCSW Group Therapy  01/11/2017 1:51 PM  Type of Therapy:  Group Therapy  Participation Level:  Patient did not attend group. CSW invited patient to group.   Summary of Progress/Problems: Balance in life: Patients will discuss the concept of balance and how it looks and feels to be unbalanced. Pt will identify areas in their life that is unbalanced and ways to become more balanced. They discussed what aspects in their lives has influenced their self care. Patients also discussed self care in the areas of self regulation/control, hygiene/appearance, sleep/relaxation, healthy leisure, healthy eating habits, exercise, inner peace/spirituality, self improvement, sobriety, and health management. They were challenged to identify changes that are needed in order to improve self care.  Winferd Wease G. Garnette CzechSampson MSW, LCSWA 01/11/2017, 1:51 PM

## 2017-01-11 NOTE — [facility] Group Notes (Signed)
Goals Group Date/Time: 01/11/2017 9:00 AM Type of Therapy and Topic: Group Therapy: Goals Group: SMART Goals   Participation Level: Moderate  Description of Group:    The purpose of a daily goals group is to assist and guide patients in setting recovery/wellness-related goals. The objective is to set goals as they relate to the crisis in which they were admitted. Patients will be using SMART goal modalities to set measurable goals. Characteristics of realistic goals will be discussed and patients will be assisted in setting and processing how one will reach their goal. Facilitator will also assist patients in applying interventions and coping skills learned in psycho-education groups to the SMART goal and process how one will achieve defined goal.   Therapeutic Goals:   -Patients will develop and document one goal related to or their crisis in which brought them into treatment.  -Patients will be guided by LCSW using SMART goal setting modality in how to set a measurable, attainable, realistic and time sensitive goal.  -Patients will process barriers in reaching goal.  -Patients will process interventions in how to overcome and successful in reaching goal.   Patient's Goal:PT goal is to exhibit more "presence of mind".  Pt mentioned mindfullness as part of this but had difficulty being more specific.   Therapeutic Modalities:  Motivational Interviewing  Research officer, political partyCognitive Behavioral Therapy  Crisis Intervention Model  SMART goals setting   Daleen SquibbGreg Malie Kashani, [place]CS[place]
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
## 2017-01-11 NOTE — Progress Notes (Signed)
Denies SI/HI/AVH.  Rates depression as 5/10.  Support and encouragement offered.  Safety maintained.

## 2017-01-11 NOTE — Progress Notes (Signed)
D: Pt denies SI/HI/AVH. Pt is pleasant and cooperative, affect is flat but brightens upon approach. Pt stated he feels better from resting  he appears less anxious and he is interacting with peers and staff appropriately.  A: Pt was offered support and encouragement. Pt was given scheduled medications. Pt was encouraged to attend groups. Q 15 minute checks were done for safety.  R:Pt attends groups and interacts well with peers and staff. Pt is taking medication. Pt has no complaints.Pt receptive to treatment and safety maintained on unit.

## 2017-01-11 NOTE — Progress Notes (Signed)
D: Pt denies SI/HI/AVH. Pt is pleasant and cooperative. Pt stayed in his room a lot , but pt presented as paranoid at times worrying about the amount of milk another pt was drinking.   A: Pt was offered support and encouragement. Pt was given scheduled medications. Pt was encourage to attend groups. Q 15 minute checks were done for safety.   R:Pt attends groups and interacts well with peers and staff. Pt is taking medication. Pt has no complaints at this time .Pt receptive to treatment and safety maintained on unit.

## 2017-01-11 NOTE — Progress Notes (Signed)
Recreation Therapy Notes  Date: 06.14.18 Time: 9:30 am Location: Craft Room  Group Topic: Leisure Education  Goal Area(s) Addresses:  Patient will identify things they are grateful for. Patient will identify how being grateful can influence decision making.  Behavioral Response: Attentive, Interactive  Intervention: Grateful Wheel  Activity: Patients were given an I Am Grateful For worksheet and were instructed to write things they are grateful for under each category.   Education: LRT educated patients on leisure.  Education Outcome: Acknowledges education/In group clarification offered  Clinical Observations/Feedback: Patient wrote things he is grateful for. Patient contributed to group discussion by stating what he is grateful for, what category had more things he is grateful for in it, what his common thread was, and that he does participate in leisure often.  Jacquelynn CreeGreene,Rylen Hou M, LRT/CTRS 01/11/2017 10:17 AM

## 2017-01-12 LAB — GLUCOSE, CAPILLARY
GLUCOSE-CAPILLARY: 105 mg/dL — AB (ref 65–99)
Glucose-Capillary: 85 mg/dL (ref 65–99)
Glucose-Capillary: 92 mg/dL (ref 65–99)
Glucose-Capillary: 118 mg/dL — ABNORMAL HIGH (ref 65–99)

## 2017-01-12 NOTE — [facility] Group Notes (Signed)
[facility] LCSW Group Therapy Note  Date/Time: 01/12/17, 1300  Type of Therapy and Topic:  Group Therapy:  Feelings around Relapse and Recovery  Participation Level:  Did Not Attend   Mood:  Description of Group:    Patients in this group will discuss emotions they experience before and after a relapse. They will process how experiencing these feelings, or avoidance of experiencing them, relates to having a relapse. Facilitator will guide patients to explore emotions they have related to recovery. Patients will be encouraged to process which emotions are more powerful. They will be guided to discuss the emotional reaction significant others in their lives may have to patients' relapse or recovery. Patients will be assisted in exploring ways to respond to the emotions of others without this contributing to a relapse.  Therapeutic Goals: 1. Patient will identify two or more emotions that lead to relapse for them:  2. Patient will identify two emotions that result when they relapse:  3. Patient will identify two emotions related to recovery:  4. Patient will demonstrate ability to communicate their needs through discussion and/or role plays.   Summary of Patient Progress:     Therapeutic Modalities:   Cognitive Behavioral Therapy Solution-Focused Therapy Assertiveness Training Relapse Prevention Therapy  Greg Akul Leggette, LCSW       

## 2017-01-12 NOTE — Tx Team (Signed)
Interdisciplinary Treatment and Diagnostic Plan Update  01/12/2017 Time of Session: 10:30am [name] MRN: [id number]  Principal Diagnosis: Schizoaffective disorder, bipolar type [facility])  Secondary Diagnoses: Principal Problem:   Schizoaffective disorder, bipolar type (HCC) Active Problems:   Diabetes (HCC)   OBESITY   OBSTRUCTIVE SLEEP APNEA   Hypertension   Hyperlipemia   COPD (chronic obstructive pulmonary disease) (HCC)   Chronic back pain   Current Medications:  Current Facility-Administered Medications  Medication Dose Route Frequency Provider Last Rate Last Dose  . acetaminophen (TYLENOL) tablet 1,000 mg  1,000 mg Oral Q6H PRN Jimmy Footman, MD   1,000 mg at 01/12/17 4098  . alum & mag hydroxide-simeth (MAALOX/MYLANTA) 200-200-20 MG/5ML suspension 30 mL  30 mL Oral Q4H PRN Clapacs, John T, MD      . amLODipine (NORVASC) tablet 10 mg  10 mg Oral Daily Clapacs, Jackquline Denmark, MD   10 mg at 01/12/17 0940  . citalopram (CELEXA) tablet 20 mg  20 mg Oral Daily Jimmy Footman, MD   20 mg at 01/12/17 0934  . clonazePAM (KLONOPIN) tablet 0.5 mg  0.5 mg Oral TID Jimmy Footman, MD   0.5 mg at 01/12/17 1259  . gabapentin (NEURONTIN) capsule 800 mg  800 mg Oral TID Jimmy Footman, MD   800 mg at 01/12/17 1259  . insulin aspart (novoLOG) injection 0-15 Units  0-15 Units Subcutaneous TID WC Clapacs, Jackquline Denmark, MD   3 Units at 01/11/17 1646  . insulin detemir (LEVEMIR) injection 35 Units  35 Units Subcutaneous BID Clapacs, Jackquline Denmark, MD   35 Units at 01/12/17 0935  . lidocaine (LIDODERM) 5 % 1 patch  1 patch Transdermal Q24H Jimmy Footman, MD   1 patch at 01/08/17 1231  . lithium carbonate (LITHOBID) CR tablet 600 mg  600 mg Oral Q12H Jimmy Footman, MD   600 mg at 01/12/17 0935  . magnesium hydroxide (MILK OF MAGNESIA) suspension 30 mL  30 mL Oral Daily PRN Clapacs, John T, MD      . metFORMIN (GLUCOPHAGE) tablet 1,000 mg   1,000 mg Oral BID WC Clapacs, Jackquline Denmark, MD   1,000 mg at 01/12/17 0934  . methocarbamol (ROBAXIN) tablet 750 mg  750 mg Oral TID PC & HS Jimmy Footman, MD   750 mg at 01/12/17 1259  . paliperidone (INVEGA) 24 hr tablet 12 mg  12 mg Oral QHS Jimmy Footman, MD   12 mg at 01/11/17 2103  . traMADol (ULTRAM) tablet 100 mg  100 mg Oral TID Jimmy Footman, MD   100 mg at 01/12/17 1259  . traZODone (DESYREL) tablet 150 mg  150 mg Oral QHS Jimmy Footman, MD   150 mg at 01/11/17 2103   PTA Medications: Prescriptions Prior to Admission  Medication Sig Dispense Refill Last Dose  . amLODipine (NORVASC) 5 MG tablet Take 2 tablets (10 mg total) by mouth daily. 60 tablet 5 12/29/2016 at 1000  . citalopram (CELEXA) 40 MG tablet Take 40 mg by mouth daily.  0 12/28/2016 at 2000  . clonazePAM (KLONOPIN) 0.5 MG tablet Take 1 tablet (0.5 mg total) by mouth daily as needed.   Past Month at Unknown time  . gabapentin (NEURONTIN) 300 MG capsule Take 300 mg by mouth 3 (three) times daily.  0 Past Month at Unknown time  . ibuprofen (ADVIL,MOTRIN) 800 MG tablet Take 1 tablet (800 mg total) by mouth every 8 (eight) hours as needed. (Patient not taking: Reported on 12/29/2016) 30 tablet 0 Completed Course at  Unknown time  . insulin detemir (LEVEMIR) 100 UNIT/ML injection Inject 35 Units into the skin 2 (two) times daily.    12/29/2016 at 0800  . lisinopril (PRINIVIL,ZESTRIL) 20 MG tablet Take 2 tablets (40 mg total) by mouth at bedtime.   Past Month at Unknown time  . lithium (ESKALITH) 450 MG CR tablet Take 450 mg by mouth 2 (two) times daily.    Past Week at Unknown time  . metFORMIN (GLUCOPHAGE) 1000 MG tablet Take 1 tablet (1,000 mg total) by mouth 2 (two) times daily with a meal. 180 tablet 1 Past Week at Unknown time  . methocarbamol (ROBAXIN) 500 MG tablet Take 1 tablet (500 mg total) by mouth every 6 (six) hours as needed for muscle spasms. (Patient not taking: Reported on  12/29/2016) 30 tablet 0 Completed Course at Unknown time  . oxyCODONE-acetaminophen (ROXICET) 5-325 MG tablet Take 1-2 tablets by mouth every 4 (four) hours as needed for severe pain. (Patient not taking: Reported on 12/29/2016) 15 tablet 0 Completed Course at Unknown time  . risperiDONE (RISPERDAL) 2 MG tablet Take 2 mg by mouth at bedtime.    12/26/2016 at 2000  . varenicline (CHANTIX CONTINUING MONTH PAK) 1 MG tablet Take 1 tablet (1 mg total) by mouth 2 (two) times daily. (Patient not taking: Reported on 12/29/2016) 60 tablet 1 Not Taking at Unknown time    Treatment Modalities: Medication Management, Group therapy, Case management,  1 to 1 session with clinician, Psychoeducation, Recreational therapy.   Physician Treatment Plan for Primary Diagnosis: Schizoaffective disorder, bipolar type (HCC) Long Term Goal(s): Improvement in symptoms so as ready for discharge Improvement in symptoms so as ready for discharge   Short Term Goals: Ability to identify changes in lifestyle to reduce recurrence of condition will improve Ability to verbalize feelings will improve Ability to disclose and discuss suicidal ideas Ability to demonstrate self-control will improve Ability to identify and develop effective coping behaviors will improve Ability to maintain clinical measurements within normal limits will improve Compliance with prescribed medications will improve Ability to identify triggers associated with substance abuse/mental health issues will improve Ability to identify changes in lifestyle to reduce recurrence of condition will improve Ability to verbalize feelings will improve Ability to disclose and discuss suicidal ideas Ability to demonstrate self-control will improve Ability to identify and develop effective coping behaviors will improve Ability to maintain clinical measurements within normal limits will improve Compliance with prescribed medications will improve Ability to identify triggers  associated with substance abuse/mental health issues will improve  Medication Management: Evaluate patient's response, side effects, and tolerance of medication regimen.  Therapeutic Interventions: 1 to 1 sessions, Unit Group sessions and Medication administration.  Evaluation of Outcomes: Progressing  Physician Treatment Plan for Secondary Diagnosis: Principal Problem:   Schizoaffective disorder, bipolar type (HCC) Active Problems:   Diabetes (HCC)   OBESITY   OBSTRUCTIVE SLEEP APNEA   Hypertension   Hyperlipemia   COPD (chronic obstructive pulmonary disease) (HCC)   Chronic back pain  Long Term Goal(s): Improvement in symptoms so as ready for discharge Improvement in symptoms so as ready for discharge   Short Term Goals: Ability to identify changes in lifestyle to reduce recurrence of condition will improve Ability to verbalize feelings will improve Ability to disclose and discuss suicidal ideas Ability to demonstrate self-control will improve Ability to identify and develop effective coping behaviors will improve Ability to maintain clinical measurements within normal limits will improve Compliance with prescribed medications will improve Ability to identify  triggers associated with substance abuse/mental health issues will improve Ability to identify changes in lifestyle to reduce recurrence of condition will improve Ability to verbalize feelings will improve Ability to disclose and discuss suicidal ideas Ability to demonstrate self-control will improve Ability to identify and develop effective coping behaviors will improve Ability to maintain clinical measurements within normal limits will improve Compliance with prescribed medications will improve Ability to identify triggers associated with substance abuse/mental health issues will improve     Medication Management: Evaluate patient's response, side effects, and tolerance of medication regimen.  Therapeutic  Interventions: 1 to 1 sessions, Unit Group sessions and Medication administration.  Evaluation of Outcomes: Progressing   RN Treatment Plan for Primary Diagnosis: Schizoaffective disorder, bipolar type (HCC) Long Term Goal(s): Knowledge of disease and therapeutic regimen to maintain health will improve  Short Term Goals: Ability to participate in decision making will improve, Ability to disclose and discuss suicidal ideas and Compliance with prescribed medications will improve  Medication Management: RN will administer medications as ordered by provider, will assess and evaluate patient's response and provide education to patient for prescribed medication. RN will report any adverse and/or side effects to prescribing provider.  Therapeutic Interventions: 1 on 1 counseling sessions, Psychoeducation, Medication administration, Evaluate responses to treatment, Monitor vital signs and CBGs as ordered, Perform/monitor CIWA, COWS, AIMS and Fall Risk screenings as ordered, Perform wound care treatments as ordered.  Evaluation of Outcomes: Progressing   LCSW Treatment Plan for Primary Diagnosis: Schizoaffective disorder, bipolar type (HCC) Long Term Goal(s): Safe transition to appropriate next level of care at discharge, Engage patient in therapeutic group addressing interpersonal concerns.  Short Term Goals: Engage patient in aftercare planning with referrals and resources, Increase social support and Increase emotional regulation  Therapeutic Interventions: Assess for all discharge needs, 1 to 1 time with Social worker, Explore available resources and support systems, Assess for adequacy in community support network, Educate family and significant other(s) on suicide prevention, Complete Psychosocial Assessment, Interpersonal group therapy.  Evaluation of Outcomes: Progressing   Progress in Treatment: Attending groups: Yes. Participating in groups: Yes. Taking medication as prescribed:  Yes. Toleration medication: Yes. Family/Significant other contact made: Yes, individual(s) contacted:  neighbor Patient understands diagnosis: Yes. Discussing patient identified problems/goals with staff: Yes. Medical problems stabilized or resolved: Yes. Denies suicidal/homicidal ideation: No. Issues/concerns per patient self-inventory: No. Other: n/a  New problem(s) identified: None identified at this time.   New Short Term/Long Term Goal(s): None identified at this time.   Discharge Plan or Barriers: Patient will discharge and follow-up with outpatient services.   Reason for Continuation of Hospitalization: Depression Medication stabilization Suicidal ideation  Estimated Length of Stay: 3 to 5 days.   Attendees: Patient:  01/12/2017 3:36 PM  Physician: Dr. Radene JourneyJayme Cloud, MD 01/12/2017 3:36 PM  Nursing: Leonia Reader, RN 01/12/2017 3:36 PM  RN Care Manager: 01/12/2017 3:36 PM  Social Worker: Fredrich Birks. Garnette Czech MSW, LCSWA 01/12/2017 3:36 PM  Recreational Therapist: Jacquelynn Cree, LRT/CTRS 01/12/2017 3:36 PM  Other:  01/12/2017 3:36 PM  Other:  01/12/2017 3:36 PM  Other: 01/12/2017 3:36 PM    Scribe for Treatment Team: Arelia Longest, LCSWA 01/12/2017 3:40 PM

## 2017-01-12 NOTE — Progress Notes (Signed)
Recreation Therapy Notes  Date: 06.15.18 Time: 9:30 am Location: Craft Room  Group Topic: Coping Skills  Goal Area(s) Addresses:  Patient will verbalize one emotion experienced in group. Patient will verbalize benefit of using art as a healthy coping skill.  Behavioral Response: Did not attend  Intervention: Coloring  Activity: Patients were given coloring sheets to color and were instructed to think about what emotions they were feeling and what their minds were focused on.  Education: LRT educated patients on healthy coping skills.  Education Outcome: Patient did not attend group.  Clinical Observations/Feedback: Patient did not attend group.  Shrinika Blatz M, LRT/CTRS 01/12/2017 10:25 AM 

## 2017-01-12 NOTE — [facility] Group Notes (Signed)
[facility] Group Notes:  (Nursing/MHT/Case Management/Adjunct)  Date:  01/12/2017  Time:  4:02 PM  Type of Therapy:  Psychoeducational Skills  Participation Level:  Minimal  Participation Quality:  Inattentive  Affect:  Flat  Cognitive:  Lacking  Insight:  Lacking  Engagement in Group:  Lacking, Limited and Supportive  Modes of Intervention:  Discussion, Education and Socialization  Summary of Progress/Problems:  Mickey Farberamela M Moses Ellison 01/12/2017, 4:02 PM
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
## 2017-01-12 NOTE — Progress Notes (Signed)
Affect blunted.  Continues to endorse passive SI no plan.  States that the SI comes and goes. Speech tangential.  Rates anxiety as 5/10.  Isolates to self.  Minimum interaction noted with pees.  No group attendance.  Medication compliant.  Maintains personal care chores.  Good appetite.  Support offered.  Safety maintained.

## 2017-01-13 LAB — GLUCOSE, CAPILLARY
GLUCOSE-CAPILLARY: 95 mg/dL (ref 65–99)
GLUCOSE-CAPILLARY: 99 mg/dL (ref 65–99)
Glucose-Capillary: 102 mg/dL — ABNORMAL HIGH (ref 65–99)
Glucose-Capillary: 170 mg/dL — ABNORMAL HIGH (ref 65–99)
Glucose-Capillary: 96 mg/dL (ref 65–99)

## 2017-01-13 NOTE — Plan of Care (Signed)
Problem: Coping: Goal: Ability to identify and develop effective coping behavior will improve Outcome: Progressing Pt compliant with group therapy reports brighter mood.

## 2017-01-13 NOTE — Progress Notes (Signed)
Pt denies SI, HI, a/v hallucinations. He is visible on unit and appears to be in bright mood. Pt is compliant with all medications. Pt is meal and group compliant. No aggressive behaviors noted. Will continue to monitor for safety.

## 2017-01-13 NOTE — [facility] Group Notes (Signed)
[facility] Group Notes:  (Nursing/MHT/Case Management/Adjunct)  Date:  01/13/2017  Time:  6:00 AM  Type of Therapy:  Psychoeducational Skills  Participation Level:  Did Not Attend  Summary of Progress/Problems:  [name] 01/13/2017, 6:00 AM

## 2017-01-13 NOTE — [facility] Group Notes (Signed)
[facility] LCSW Group Therapy  01/13/2017 2:49 PM  Type of Therapy:  Group Therapy  Participation Level:  Active  Participation Quality:  Appropriate and Sharing  Affect:  Appropriate  Cognitive:  Alert  Insight:  Developing/Improving  Engagement in Therapy:  Developing/Improving  Modes of Intervention:  Clarification, Discussion, Exploration, Reality Testing, Socialization and Support  Summary of Progress/Problems: Coping Skills: Patients defined and discussed healthy coping skills. Patients identified healthy coping skills they would like to try during hospitalization and after discharge. CSW offered insight to varying coping skills that may have been new to patients such as practicing mindfulness.  Jamia Hoban G. Garnette CzechSampson MSW, LCSWA 01/13/2017, 2:51 PM

## 2017-01-13 NOTE — Plan of Care (Signed)
Problem: Coping: Goal: Ability to identify and develop effective coping behavior will improve Outcome: Progressing Patient was active with the group watching sports this evening.  He reported that his pain is 0/10 at 2130 and expressed being pleased that "something is going well".  He initiated discussion of "cutting down on my fluid intake" and reviewed how this is helping his sodium level.  He denies thoughts of self harm and states he is looking forward to discharge next week.
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
## 2017-01-14 LAB — GLUCOSE, CAPILLARY
GLUCOSE-CAPILLARY: 97 mg/dL (ref 65–99)
GLUCOSE-CAPILLARY: 117 mg/dL — AB (ref 65–99)
Glucose-Capillary: 107 mg/dL — ABNORMAL HIGH (ref 65–99)
Glucose-Capillary: 157 mg/dL — ABNORMAL HIGH (ref 65–99)

## 2017-01-14 MED ORDER — CLONAZEPAM 0.5 MG PO TABS
0.5000 mg | ORAL_TABLET | Freq: Two times a day (BID) | ORAL | Status: DC
  Administered 2017-01-15: 0.5 mg via ORAL
  Filled 2017-01-14: qty 1

## 2017-01-14 NOTE — Progress Notes (Signed)
Patient was visible at time on the unit and went outside for fresh air, but stayed to self in room reading and drawing. Patient talked about his interest in various types of painting. Patient knows discharge is coming and talked about how he feels better and feels the staff here as helped him get back on track and will write and use resources to help him stay on track when discharge. Patient denies any SI/HI/AH/Vh. Patient is alert and oriented x 4, breathing unlabored, and extremities x 4 within normal limits. Patient is calm and cooperative. Patient did not display any disruptive behavior.  Will continue to monitor patient and notify MD of any changes.

## 2017-01-14 NOTE — Plan of Care (Signed)
Problem: Coping: Goal: Ability to identify and develop effective coping behavior will improve Outcome: Progressing Patient reported that he feels he able to cope better and that the staff really helped him during his time. Patient stated he will try to write things down and use resources to help him through tough situations and to stay on top of his treatment regimen.

## 2017-01-14 NOTE — [facility] Group Notes (Signed)
[facility] LCSW Group Therapy  01/14/2017 2:23 PM  Type of Therapy:  Group Therapy  Participation Level:  Patient did not attend group. CSW invited patient to group.   Summary of Progress/Problems: Goal Setting: The objective is to set goals as they relate to the crisis in which they were admitted. Patients will be using SMART goal modalities to set measurable goals. Characteristics of realistic goals will be discussed and patients will be assisted in setting and processing how one will reach their goal. Facilitator will also assist patients in applying interventions and coping skills learned in psycho-education groups to the SMART goal and process how one will achieve defined goal.  Cortavious Nix G. Garnette CzechSampson MSW, LCSWA 01/14/2017, 2:23 PM
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
## 2017-01-14 NOTE — Plan of Care (Signed)
Problem: Coping: Goal: Ability to identify and develop effective coping behavior will improve Outcome: Progressing Patient spent time in the dayroom with peers and also went outside.  He initiated a request that he be prepared to "stick to my medication schedule at home like I do here".  He verbalized having concerns about maintaining a schedule at home "so I will not mess up and miss my medications".  He also discussed that he is watching his water intake so he will not "be in trouble with my sodium level".  He was given praise for having valid concerns and motivation to perform self care.

## 2017-01-15 LAB — BASIC METABOLIC PANEL
ANION GAP: 7 (ref 5–15)
BUN: 25 mg/dL — ABNORMAL HIGH (ref 6–20)
CHLORIDE: 103 mmol/L (ref 101–111)
CO2: 26 mmol/L (ref 22–32)
Calcium: 9.2 mg/dL (ref 8.9–10.3)
Creatinine, Ser: 1.03 mg/dL (ref 0.61–1.24)
GFR calc Af Amer: >60 mL/min (ref >60)
GLUCOSE: 143 mg/dL — AB (ref 65–99)
POTASSIUM: 3.8 mmol/L (ref 3.5–5.1)
Sodium: 136 mmol/L (ref 135–145)

## 2017-01-15 LAB — GLUCOSE, CAPILLARY: Glucose-Capillary: 115 mg/dL — ABNORMAL HIGH (ref 65–99)

## 2017-01-15 LAB — LITHIUM LEVEL: LITHIUM LVL: 0.71 mmol/L (ref 0.60–1.20)

## 2017-01-15 MED ORDER — CLONAZEPAM 0.5 MG PO TABS: 0.5000 mg | ORAL_TABLET | Freq: Three times a day (TID) | ORAL | 0 refills | Status: DC | PRN

## 2017-01-15 MED ORDER — TRAMADOL HCL 50 MG PO TABS: 50.0000 mg | ORAL_TABLET | Freq: Four times a day (QID) | ORAL | 0 refills | Status: DC | PRN

## 2017-01-15 MED ORDER — LITHIUM CARBONATE ER 300 MG PO TBCR: 600.0000 mg | EXTENDED_RELEASE_TABLET | Freq: Two times a day (BID) | ORAL | 0 refills | Status: DC

## 2017-01-15 MED ORDER — CITALOPRAM HYDROBROMIDE 40 MG PO TABS: 40.0000 mg | ORAL_TABLET | Freq: Every day | ORAL | 0 refills | Status: DC

## 2017-01-15 MED ORDER — GABAPENTIN 400 MG PO CAPS: 800.0000 mg | ORAL_CAPSULE | Freq: Three times a day (TID) | ORAL | 0 refills | Status: AC

## 2017-01-15 MED ORDER — INSULIN DETEMIR 100 UNIT/ML ~~LOC~~ SOLN: 35.0000 [IU] | Freq: Two times a day (BID) | SUBCUTANEOUS | 0 refills | Status: DC

## 2017-01-15 NOTE — Progress Notes (Signed)
  [facility] Discharge Plan :  Will you be returning to the same living situation after discharge:  Yes,  home At discharge, do you have transportation home?: Yes,  friend Do you have the ability to pay for your medications: Yes,  insured  Release of information consent forms completed and in the chart;  Patient's signature needed at discharge.  Patient to Follow up at: Follow-up Information    [facility] Go on 01/22/2017.   Why:  Please follow-up with your psychiatrist at [facility] on June 25th at 3:00 PM for medication management. If you have any questions or concerns, contact clinic directly.  Contact information: [address] [place] [address] [phone]        [name], Maxwell Key, MD. Go on 01/18/2017.   Specialty:  Family Medicine Why:  Your appointment is at 9:40 am. Please bring a copy of your current list of medications and your insurance cards with you to this appointment. Contact information: [address] [place] [address] [phone]           Next level of care provider [facility] Link:no  Safety Planning and Suicide Prevention discussed: Yes,  w friend Hanley HaysKimber Hamilton     Has patient been referred to the Quitline?: N/A patient is not a smoker  Patient has been referred for addiction treatment: Yes  [name] [name] 01/15/2017, 4:52 PM

## 2017-01-15 NOTE — Progress Notes (Signed)
Recreation Therapy Notes  Date: 06.18.18 Time: 9:30 am Location: Craft Room  Group Topic: Self-expression  Goal Area(s) Addresses:  Patient will effectively use art as a means of self-expression. Patient will recognize positive benefit of self-expression. Patient will be able to identify one emotion experienced during group session. Patient will identify use of art as a coping skill.  Behavioral Response: Did not attend  Intervention: Two Faces of Me  Activity: Patients were given a blank face worksheet and were instructed to draw a line down the middle. On one side, patients were instructed to draw or write how they felt when they were admitted to the hospital and on the other side they were instructed to draw or write how they wanted to feel when they are d/c.  Education: LRT educated patients on other forms of self-expression.  Education Outcome: Patient did not attend group.  Clinical Observations/Feedback: Patient did not attend group.  Jacquelynn CreeGreene,Jeffrie Stander M, LRT/CTRS 01/15/2017 10:07 AM

## 2017-01-15 NOTE — Progress Notes (Signed)
Denies SI/HI/AVH.  Rates depression as 0/10 and anxiety as a 2/10.   Discharge instructions given, verbalized understanding.  Prescriptions given and store medication returned.  Personal belongings returned.  Patient escorted off unit by this writer to travel home with a friend.

## 2017-01-15 NOTE — Plan of Care (Signed)
Problem: Education: Goal: Ability to state activities that reduce stress will improve Outcome: Progressing Patient continues to discuss plans for discharge and to work on getting ready.  He spent time with peers in the dayroom and has been pleasant on approach.  He denies SI/HI/AV/H and contracts for safety.

## 2017-01-15 NOTE — Progress Notes (Signed)
Recreation Therapy Notes  INPATIENT RECREATION TR PLAN  Patient Details Name: Eldrige Pitkin MRN: [id number] DOB: May 03, 1966 Today's Date: 01/15/2017  Rec Therapy Plan Is patient appropriate for Therapeutic Recreation?: Yes Treatment times per week: At least once a week TR Treatment/Interventions: 1:1 session, Group participation (Comment) (Appropriate participation in daily recreational therapy tx)  Discharge Criteria Pt will be discharged from therapy if:: Treatment goals are met, Discharged Treatment plan/goals/alternatives discussed and agreed upon by:: Patient/family  Discharge Summary Short term goals set: See Care Plan Short term goals met: Complete Progress toward goals comments: One-to-one attended Which groups?: Self-esteem, Coping skills, Leisure education, Other (Comment) (Self-expression) One-to-one attended: Self-esteem, stress management Reason goals not met: N/A Therapeutic equipment acquired: None Reason patient discharged from therapy: Discharge from hospital Pt/family agrees with progress & goals achieved: Yes Date patient discharged from therapy: 01/15/17   Leonette [facility], LRT/CTRS 01/15/2017, 3:39 PM

## 2017-01-15 NOTE — [facility] Group Notes (Signed)
[facility] Group Notes:  (Nursing/MHT/Case Management/Adjunct)  Date:  01/15/2017  Time:  12:29 AM  Type of Therapy:  Evening Wrap-up Group  Participation Level:  Minimal  Participation Quality:  Appropriate  Affect:  Appropriate  Cognitive:  Appropriate  Insight:  Improving  Engagement in Group:  Developing/Improving  Modes of Intervention:  Activity and Discussion  Summary of Progress/Problems:  Maxwell MorrowChelsea Hamilton [name] 01/15/2017, 12:29 AM

## 2017-01-17 DIAGNOSIS — F411 Generalized anxiety disorder: Secondary | ICD-10-CM | POA: Diagnosis not present

## 2017-01-17 DIAGNOSIS — F4310 Post-traumatic stress disorder, unspecified: Secondary | ICD-10-CM | POA: Diagnosis not present

## 2017-01-17 DIAGNOSIS — F250 Schizoaffective disorder, bipolar type: Secondary | ICD-10-CM | POA: Diagnosis not present

## 2017-01-18 ENCOUNTER — Emergency Department
Admission: EM | Admit: 2017-01-18 | Discharge: 2017-01-18 | Disposition: A | Discharge status: Home / Self Care | Payer: PPO | Attending: Emergency Medicine | Admitting: Emergency Medicine

## 2017-01-18 DIAGNOSIS — F250 Schizoaffective disorder, bipolar type: Secondary | ICD-10-CM | POA: Diagnosis not present

## 2017-01-18 DIAGNOSIS — R7309 Other abnormal glucose: Secondary | ICD-10-CM | POA: Diagnosis not present

## 2017-01-18 DIAGNOSIS — I10 Essential (primary) hypertension: Secondary | ICD-10-CM | POA: Diagnosis not present

## 2017-01-18 DIAGNOSIS — M545 Low back pain: Secondary | ICD-10-CM | POA: Diagnosis not present

## 2017-01-18 DIAGNOSIS — Z794 Long term (current) use of insulin: Secondary | ICD-10-CM | POA: Insufficient documentation

## 2017-01-18 DIAGNOSIS — E119 Type 2 diabetes mellitus without complications: Secondary | ICD-10-CM | POA: Insufficient documentation

## 2017-01-18 DIAGNOSIS — F4325 Adjustment disorder with mixed disturbance of emotions and conduct: Secondary | ICD-10-CM | POA: Diagnosis not present

## 2017-01-18 DIAGNOSIS — F329 Major depressive disorder, single episode, unspecified: Secondary | ICD-10-CM | POA: Diagnosis not present

## 2017-01-18 DIAGNOSIS — R45851 Suicidal ideations: Secondary | ICD-10-CM | POA: Diagnosis not present

## 2017-01-18 DIAGNOSIS — J449 Chronic obstructive pulmonary disease, unspecified: Secondary | ICD-10-CM | POA: Diagnosis not present

## 2017-01-18 DIAGNOSIS — F17210 Nicotine dependence, cigarettes, uncomplicated: Secondary | ICD-10-CM | POA: Diagnosis not present

## 2017-01-18 DIAGNOSIS — F17200 Nicotine dependence, unspecified, uncomplicated: Secondary | ICD-10-CM | POA: Diagnosis not present

## 2017-01-18 DIAGNOSIS — Z79899 Other long term (current) drug therapy: Secondary | ICD-10-CM | POA: Insufficient documentation

## 2017-01-18 DIAGNOSIS — F99 Mental disorder, not otherwise specified: Secondary | ICD-10-CM | POA: Diagnosis not present

## 2017-01-18 DIAGNOSIS — F32A Depression, unspecified: Secondary | ICD-10-CM

## 2017-01-18 LAB — COMPREHENSIVE METABOLIC PANEL
ALBUMIN: 4.4 g/dL (ref 3.5–5.0)
ALK PHOS: 84 U/L (ref 38–126)
ALT: 31 U/L (ref 17–63)
AST: 29 U/L (ref 15–41)
Anion gap: 7 (ref 5–15)
BILIRUBIN TOTAL: 0.4 mg/dL (ref 0.3–1.2)
BUN: 14 mg/dL (ref 6–20)
CO2: 24 mmol/L (ref 22–32)
Calcium: 9.1 mg/dL (ref 8.9–10.3)
Chloride: 107 mmol/L (ref 101–111)
Creatinine, Ser: 1.01 mg/dL (ref 0.61–1.24)
GFR calc Af Amer: >60 mL/min (ref >60)
GFR calc non Af Amer: >60 mL/min (ref >60)
GLUCOSE: 112 mg/dL — AB (ref 65–99)
POTASSIUM: 3.5 mmol/L (ref 3.5–5.1)
Sodium: 138 mmol/L (ref 135–145)
TOTAL PROTEIN: 7.7 g/dL (ref 6.5–8.1)

## 2017-01-18 LAB — CBC
HEMATOCRIT: 42 % (ref 40.0–52.0)
HEMOGLOBIN: 14.6 g/dL (ref 13.0–18.0)
MCH: 31.5 pg (ref 26.0–34.0)
MCHC: 34.7 g/dL (ref 32.0–36.0)
MCV: 90.9 fL (ref 80.0–100.0)
Platelets: 305 10*3/uL (ref 150–440)
RBC: 4.62 MIL/uL (ref 4.40–5.90)
RDW: 13.2 % (ref 11.5–14.5)
WBC: 14.5 10*3/uL — AB (ref 3.8–10.6)

## 2017-01-18 LAB — URINE DRUG SCREEN, QUALITATIVE (ARMC ONLY)
AMPHETAMINES, UR SCREEN: NOT DETECTED
Barbiturates, Ur Screen: NOT DETECTED
Benzodiazepine, Ur Scrn: NOT DETECTED
COCAINE METABOLITE, UR ~~LOC~~: NOT DETECTED
Cannabinoid 50 Ng, Ur ~~LOC~~: NOT DETECTED
MDMA (ECSTASY) UR SCREEN: NOT DETECTED
METHADONE SCREEN, URINE: NOT DETECTED
OPIATE, UR SCREEN: NOT DETECTED
Phencyclidine (PCP) Ur S: NOT DETECTED
Tricyclic, Ur Screen: NOT DETECTED

## 2017-01-18 LAB — ETHANOL: Alcohol, Ethyl (B): <5 mg/dL (ref <5)

## 2017-01-18 LAB — SALICYLATE LEVEL: Salicylate Lvl: <7 mg/dL (ref 2.8–30.0)

## 2017-01-18 LAB — ACETAMINOPHEN LEVEL

## 2017-01-18 NOTE — ED Notes (Signed)

## 2017-01-18 NOTE — ED Notes (Signed)
BEHAVIORAL HEALTH ROUNDING Patient sleeping: No. Patient alert and oriented: yes Behavior appropriate: Yes.  ; If no, describe:  Nutrition and fluids offered: yes Toileting and hygiene offered: Yes  Sitter present: q15 minute observations and security  monitoring Law enforcement present: Yes  ODS  

## 2017-01-18 NOTE — ED Notes (Signed)
Patient in bathroom

## 2017-01-18 NOTE — Consult Note (Signed)
[facility] Consult   Reason for Consult:  Consult for 51 year old man with a history of schizoaffective disorder Referring Physician:  Quentin Cornwall Patient Identification: [name] MRN:  [id number] Principal Diagnosis: Adjustment disorder with mixed disturbance of emotions and conduct Diagnosis:   Patient Active Problem List   Diagnosis Date Noted  . Adjustment disorder with mixed disturbance of emotions and conduct [F43.25] 01/18/2017  . Hyperlipemia [E78.5] 01/01/2017  . COPD (chronic obstructive pulmonary disease) ([place]) [J44.9] 01/01/2017  . Chronic back pain [M54.9, G89.29] 01/01/2017  . Schizoaffective disorder, bipolar type ([place]) [F25.0] 12/29/2016  . OBSTRUCTIVE SLEEP APNEA [G47.33] 05/13/2007  . Diabetes ([place]) [E11.9] 02/13/2007  . OBESITY [E66.9] 02/13/2007  . Hypertension [I10] 02/13/2007    Total Time spent with patient: 45 minutes  Subjective:   [name] is a 51 y.o. male patient admitted with "I had a mini breakdown".  HPI:  Patient interviewed chart reviewed. 51 year old man with a history of schizoaffective disorder who was discharged from the hospital a short time ago. Patient went to see his outpatient mental health provider today and was emotional with tearfulness and agitation. He tells me that he had learned how much hisInvega was going to cost him and it got him emotionally upset. Admits that he might of made some vaguely suicidal statements but did not actually act on it. Patient denies any actual thought about wanting to kill himself. Has not had a return of any psychotic symptoms. Has been medicine compliant and largely feeling pretty good. Sleep habits are reasonably back on his own schedule. Not abusing drugs or alcohol.  Social history: Patient lives by himself. Has some support from his family.  Substance abuse history: Not abusing alcohol or drugs  Medical history: Has diabetes I blood pressure and is overweight  Past  Psychiatric History: Patient has a past history of schizoaffective disorder. Past history of suicidality multiple hospitalizations psychosis. Just recently in the hospital here with some medicine changes recently discharged.  Risk to Self: Suicidal Ideation: No Suicidal Intent: No Is patient at risk for suicide?: No Suicidal Plan?: No Specify Current Suicidal Plan: n/a Access to Means: No What has been your use of drugs/alcohol within the last 12 months?: n/a How many times?: 2 Other Self Harm Risks: 0 Triggers for Past Attempts: Hallucinations Intentional Self Injurious Behavior: None Risk to Others: Homicidal Ideation: No Thoughts of Harm to Others: No Current Homicidal Intent: No Current Homicidal Plan: No Access to Homicidal Means: No Identified Victim: n/a History of harm to others?: No Assessment of Violence: None Noted Violent Behavior Description: n/a Does patient have access to weapons?: No Criminal Charges Pending?: No Does patient have a court date: No Prior Inpatient Therapy: Prior Inpatient Therapy: Yes Prior Therapy Dates: multiple Prior Therapy Facilty/Provider(s): [facility] Reason for Treatment: OD Prior Outpatient Therapy: Prior Outpatient Therapy: Yes Prior Therapy Dates: current Prior Therapy Facilty/Provider(s): [facility] Reason for Treatment: Schizoaffective Does patient have an ACCT team?: No Does patient have Intensive In-House Services?  : No Does patient have [facility] services? : No Does patient have P4CC services?: No  Past Medical History:  Past Medical History:  Diagnosis Date  . 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 Alcohol abuse   . Bipolar disorder ([place])    [facility] psych admission 12/2011 for SI  . BRBPR (bright red blood per rectum) 05/30 - 01/01/07   [facility]  . COPD (chronic obstructive pulmonary disease) ([place])   .  Depression   . Diabetes mellitus    Type II  . ED (erectile dysfunction)   .  GI bleed 06/15 - 01/20/07   [facility] ,  NSaids, anemia  . Hyperlipidemia   . Hypertension   . Lithium toxicity 7/1 - 02/02/07   Dent  . OSA (obstructive sleep apnea)   . Overdose    Episodes in the past (2)  . Plantar fasciitis    3 Cortisone shots in heel (Dr. Milinda Pointer)  . S/P endoscopy 01/16/07   Capsule, bleeding in small bowel  . Schizoaffective disorder     Past Surgical History:  Procedure Laterality Date  . BACK SURGERY  05/28/07   L4-S1 fusing, pins, screws and rods (Dr. Louanne Skye)  . DOPPLER ECHOCARDIOGRAPHY  10/20/04   Normal   Family History:  Family History  Problem Relation Age of Onset  . Cancer Mother        Breast, bone CA in pelvis and lower back  . Depression Mother        mild paranoid schizophrenic  . Diabetes Maternal Grandfather   . COPD Maternal Grandfather    Family Psychiatric  History: Positive for mental health problems Social History:  History  Alcohol Use No    Comment: Rarely     History  Drug Use No    Social History   Social History  . Marital status: Divorced    Spouse name: N/A  . Number of children: 1  . Years of education: N/A   Occupational History  . Machinist, Film/video editor. Disabled  . Disability from back surgery    Social History Main Topics  . Smoking status: Current Some Day Smoker    Packs/day: 0.50    Years: 14.00    Types: Cigarettes  . Smokeless tobacco: Never Used  . Alcohol use No     Comment: Rarely  . Drug use: No  . Sexual activity: Not on file   Other Topics Concern  . Not on file   Social History Narrative  . No narrative on file   Additional Social History:    Allergies:   Allergies  Allergen Reactions  . Divalproex Sodium     REACTION: unspecified reaction    Labs:  Results for orders placed or performed during the hospital encounter of 01/18/17 (from the past 48 hour(s))  Urine Drug Screen, Qualitative     Status: None   Collection Time: 01/18/17 12:39 PM  Result Value  Ref Range   Tricyclic, Ur Screen NONE DETECTED NONE DETECTED   Amphetamines, Ur Screen NONE DETECTED NONE DETECTED   MDMA (Ecstasy)Ur Screen NONE DETECTED NONE DETECTED   Cocaine Metabolite,Ur [place] NONE DETECTED NONE DETECTED   Opiate, Ur Screen NONE DETECTED NONE DETECTED   Phencyclidine (PCP) Ur S NONE DETECTED NONE DETECTED   Cannabinoid 50 Ng, Ur [place] NONE DETECTED NONE DETECTED   Barbiturates, Ur Screen NONE DETECTED NONE DETECTED   Benzodiazepine, Ur Scrn NONE DETECTED NONE DETECTED   Methadone Scn, Ur NONE DETECTED NONE DETECTED    Comment: (NOTE) 735  Tricyclics, urine               Cutoff 1000 ng/mL 200  Amphetamines, urine             Cutoff 1000 ng/mL 300  MDMA (Ecstasy), urine           Cutoff 500 ng/mL 400  Cocaine Metabolite, urine       Cutoff 300 ng/mL 500  Opiate, urine                   Cutoff 300 ng/mL 600  Phencyclidine (PCP), urine      Cutoff 25 ng/mL 700  Cannabinoid, urine              Cutoff 50 ng/mL 800  Barbiturates, urine             Cutoff 200 ng/mL 900  Benzodiazepine, urine           Cutoff 200 ng/mL 1000 Methadone, urine                Cutoff 300 ng/mL 1100 1200 The urine drug screen provides only a preliminary, unconfirmed 1300 analytical test result and should not be used for non-medical 1400 purposes. Clinical consideration and professional judgment should 1500 be applied to any positive drug screen result due to possible 1600 interfering substances. A more specific alternate chemical method 1700 must be used in order to obtain a confirmed analytical result.  1800 Gas chromato graphy / mass spectrometry (GC/MS) is the preferred 1900 confirmatory method.   Comprehensive metabolic panel     Status: Abnormal   Collection Time: 01/18/17 12:48 PM  Result Value Ref Range   Sodium 138 135 - 145 mmol/L   Potassium 3.5 3.5 - 5.1 mmol/L   Chloride 107 101 - 111 mmol/L   CO2 24 22 - 32 mmol/L   Glucose, Bld 112 (H) 65 - 99 mg/dL   BUN 14 6 - 20 mg/dL    Creatinine, Ser 1.01 0.61 - 1.24 mg/dL   Calcium 9.1 8.9 - 10.3 mg/dL   Total Protein 7.7 6.5 - 8.1 g/dL   Albumin 4.4 3.5 - 5.0 g/dL   AST 29 15 - 41 U/L   ALT 31 17 - 63 U/L   Alkaline Phosphatase 84 38 - 126 U/L   Total Bilirubin 0.4 0.3 - 1.2 mg/dL   GFR calc non Af Amer >60 >60 mL/min   GFR calc Af Amer >60 >60 mL/min    Comment: (NOTE) The eGFR has been calculated using the CKD EPI equation. This calculation has not been validated in all clinical situations. eGFR's persistently <60 mL/min signify possible Chronic Kidney Disease.    Anion gap 7 5 - 15  Ethanol     Status: None   Collection Time: 01/18/17 12:48 PM  Result Value Ref Range   Alcohol, Ethyl (B) <5 <5 mg/dL    Comment:        LOWEST DETECTABLE LIMIT FOR SERUM ALCOHOL IS 5 mg/dL FOR MEDICAL PURPOSES ONLY   Salicylate level     Status: None   Collection Time: 01/18/17 12:48 PM  Result Value Ref Range   Salicylate Lvl <9.3 2.8 - 30.0 mg/dL  Acetaminophen level     Status: Abnormal   Collection Time: 01/18/17 12:48 PM  Result Value Ref Range   Acetaminophen (Tylenol), Serum <10 (L) 10 - 30 ug/mL    Comment:        THERAPEUTIC CONCENTRATIONS VARY SIGNIFICANTLY. A RANGE OF 10-30 ug/mL MAY BE AN EFFECTIVE CONCENTRATION FOR MANY PATIENTS. HOWEVER, SOME ARE BEST TREATED AT CONCENTRATIONS OUTSIDE THIS RANGE. ACETAMINOPHEN CONCENTRATIONS >150 ug/mL AT 4 HOURS AFTER INGESTION AND >50 ug/mL AT 12 HOURS AFTER INGESTION ARE OFTEN ASSOCIATED WITH TOXIC REACTIONS.   cbc     Status: Abnormal   Collection Time: 01/18/17 12:48 PM  Result Value Ref Range   WBC 14.5 (H) 3.8 - 10.6  K/uL   RBC 4.62 4.40 - 5.90 MIL/uL   Hemoglobin 14.6 13.0 - 18.0 g/dL   HCT 42.0 40.0 - 52.0 %   MCV 90.9 80.0 - 100.0 fL   MCH 31.5 26.0 - 34.0 pg   MCHC 34.7 32.0 - 36.0 g/dL   RDW 13.2 11.5 - 14.5 %   Platelets 305 150 - 440 K/uL    No current facility-administered medications for this encounter.    Current Outpatient  Prescriptions  Medication Sig Dispense Refill  . amLODipine (NORVASC) 10 MG tablet Take 1 tablet (10 mg total) by mouth daily. 30 tablet 0  . citalopram (CELEXA) 40 MG tablet Take 1 tablet (40 mg total) by mouth daily. 30 tablet 0  . clonazePAM (KLONOPIN) 0.5 MG tablet Take 1 tablet (0.5 mg total) by mouth 3 (three) times daily as needed for anxiety. 45 tablet 0  . lithium carbonate (ESKALITH) 450 MG CR tablet Take 450 mg by mouth 2 (two) times daily.  0  . metFORMIN (GLUCOPHAGE) 1000 MG tablet Take 1 tablet (1,000 mg total) by mouth 2 (two) times daily with a meal. 60 tablet 0  . methocarbamol (ROBAXIN) 750 MG tablet Take 1 tablet (750 mg total) by mouth every 8 (eight) hours as needed for muscle spasms. 45 tablet 0  . risperiDONE (RISPERDAL) 2 MG tablet Take 2 mg by mouth at bedtime.  0  . traZODone (DESYREL) 150 MG tablet Take 1 tablet (150 mg total) by mouth at bedtime. 30 tablet 0  . gabapentin (NEURONTIN) 400 MG capsule Take 2 capsules (800 mg total) by mouth 3 (three) times daily. 180 capsule 0  . insulin detemir (LEVEMIR) 100 UNIT/ML injection Inject 0.35 mLs (35 Units total) into the skin 2 (two) times daily. 21 mL 0  . lithium carbonate (LITHOBID) 300 MG CR tablet Take 2 tablets (600 mg total) by mouth every 12 (twelve) hours. (Patient not taking: Reported on 01/18/2017) 120 tablet 0  . paliperidone (INVEGA) 6 MG 24 hr tablet Take 2 tablets (12 mg total) by mouth at bedtime. 60 tablet 0  . traMADol (ULTRAM) 50 MG tablet Take 1 tablet (50 mg total) by mouth every 6 (six) hours as needed for severe pain. 30 tablet 0    Musculoskeletal: Strength & Muscle Tone: within normal limits Gait & Station: normal Patient leans: N/A  Psychiatric Specialty Exam: Physical Exam  Nursing note and vitals reviewed. Constitutional: He appears well-developed and well-nourished.  HENT:  Head: Normocephalic and atraumatic.  Eyes: Conjunctivae are normal. Pupils are equal, round, and reactive to light.   Neck: Normal range of motion.  Cardiovascular: Regular rhythm and normal heart sounds.   Respiratory: Effort normal. No respiratory distress.  GI: Soft.  Musculoskeletal: Normal range of motion.  Neurological: He is alert.  Skin: Skin is warm and dry.  Psychiatric: He has a normal mood and affect. His speech is normal and behavior is normal. Judgment and thought content normal. Cognition and memory are normal.    Review of Systems  Constitutional: Negative.   HENT: Negative.   Eyes: Negative.   Respiratory: Negative.   Cardiovascular: Negative.   Gastrointestinal: Negative.   Musculoskeletal: Negative.   Skin: Negative.   Neurological: Negative.   Psychiatric/Behavioral: Negative.     Blood pressure 128/88, pulse 77, temperature 98.1 F (36.7 C), temperature source Oral, resp. rate 17, SpO2 98 %.There is no height or weight on file to calculate BMI.  General Appearance: Casual  Eye Contact:  Good  Speech:  Clear and Coherent  Volume:  Normal  Mood:  Euthymic  Affect:  Constricted  Thought Process:  Goal Directed  Orientation:  Full (Time, Place, and Person)  Thought Content:  Logical  Suicidal Thoughts:  No  Homicidal Thoughts:  No  Memory:  Immediate;   Fair Recent;   Fair Remote;   Fair  Judgement:  Fair  Insight:  Fair  Psychomotor Activity:  Normal  Concentration:  Concentration: Fair  Recall:  [employer] of Knowledge:  Fair  Language:  Fair  Akathisia:  No  Handed:  Right  AIMS (if indicated):     Assets:  Desire for Improvement Housing Physical Health Social Support  ADL's:  Intact  Cognition:  WNL  Sleep:        Treatment Plan Summary: Plan 51 year old man with schizoaffective disorder had what he describes as a "mini breakdown" earlier today. Denies ever having any actual thought of killing himself. Patient has good insight. Seems to be much more lucid and with clearer thinking than the last time I saw him. He is compliant with medicine. Patient at  this point does not meet commitment criteria does not require rehospitalization. No new prescriptions written. Supportive counseling completed with encouragement that he follow-up with his outpatient plan. Case reviewed with emergency room physician and TTS. Patient can be released from the emergency room.  Disposition: Patient does not meet criteria for psychiatric inpatient admission. Supportive therapy provided about ongoing stressors.  Alethia Berthold, MD 01/18/2017 6:37 PM
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
## 2017-01-18 NOTE — ED Provider Notes (Signed)
[facility] Emergency Department Provider Note ____________________________________________   I have reviewed the triage vital signs and the triage nursing note.  HISTORY  Chief Complaint Psychiatric Evaluation   Historian Patient  HPI [name] is a 51 y.o. male with history of bipolar disorder and recent 2 week behavioral medicine admission and discharge within the last couple of days, presents due to stating that he was having a crying spell where he thought he was having a small break down. Patient states that when he was discharged from the hospital he did not have access to his disability check it was unable to pick up his medications from the pharmacy. He however has been patent does have refills ready to pick up at [facility].  Patient states he saw his primary care doctor today and told her about his breakdownand was referred to the ED for further evaluation.  Patient states that he actually feels like he is doing a little better now and would like to go pick up his medications.    Past Medical History:  Diagnosis Date  . 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 Alcohol abuse   . Bipolar disorder (HCC)    [facility] 12/2011 for SI  . BRBPR (bright red blood per rectum) 05/30 - 01/01/07   [facility]  . COPD (chronic obstructive pulmonary disease) (HCC)   . Depression   . Diabetes mellitus    Type II  . ED (erectile dysfunction)   . GI bleed 06/15 - 01/20/07   [facility] ,  NSaids, anemia  . Hyperlipidemia   . Hypertension   . Lithium toxicity 7/1 - 02/02/07   [facility]  . OSA (obstructive sleep apnea)   . Overdose    Episodes in the past (2)  . Plantar fasciitis    3 Cortisone shots in heel (Dr. Al CorpusHyatt)  . S/P endoscopy 01/16/07   Capsule, bleeding in small bowel  . Schizoaffective disorder     Patient Active Problem List   Diagnosis Date Noted  . Hyperlipemia 01/01/2017  .  COPD (chronic obstructive pulmonary disease) (HCC) 01/01/2017  . Chronic back pain 01/01/2017  . Schizoaffective disorder, bipolar type (HCC) 12/29/2016  . OBSTRUCTIVE SLEEP APNEA 05/13/2007  . Diabetes (HCC) 02/13/2007  . OBESITY 02/13/2007  . Hypertension 02/13/2007    Past Surgical History:  Procedure Laterality Date  . BACK SURGERY  05/28/07   L4-S1 fusing, pins, screws and rods (Dr. Otelia SergeantNitka)  . DOPPLER ECHOCARDIOGRAPHY  10/20/04   Normal    Prior to Admission medications   Medication Sig Start Date End Date Taking? Authorizing Provider  amLODipine (NORVASC) 10 MG tablet Take 1 tablet (10 mg total) by mouth daily. 01/06/17  Yes Jimmy FootmanHernandez-Gonzalez, Andrea, MD  citalopram (CELEXA) 40 MG tablet Take 1 tablet (40 mg total) by mouth daily. 01/15/17  Yes Jimmy FootmanHernandez-Gonzalez, Andrea, MD  clonazePAM (KLONOPIN) 0.5 MG tablet Take 1 tablet (0.5 mg total) by mouth 3 (three) times daily as needed for anxiety. 01/15/17  Yes Jimmy FootmanHernandez-Gonzalez, Andrea, MD  lithium carbonate (ESKALITH) 450 MG CR tablet Take 450 mg by mouth 2 (two) times daily. 01/17/17  Yes [provider]  metFORMIN (GLUCOPHAGE) 1000 MG tablet Take 1 tablet (1,000 mg total) by mouth 2 (two) times daily with a meal. 01/05/17  Yes Jimmy FootmanHernandez-Gonzalez, Andrea, MD  methocarbamol (ROBAXIN) 750 MG tablet Take 1 tablet (750 mg total) by mouth every 8 (eight) hours as needed  for muscle spasms. 01/05/17  Yes Jimmy Footman, MD  risperiDONE (RISPERDAL) 2 MG tablet Take 2 mg by mouth at bedtime. 10/14/16  Yes [provider]  traZODone (DESYREL) 150 MG tablet Take 1 tablet (150 mg total) by mouth at bedtime. 01/05/17  Yes Jimmy Footman, MD  gabapentin (NEURONTIN) 400 MG capsule Take 2 capsules (800 mg total) by mouth 3 (three) times daily. 01/15/17   Jimmy Footman, MD  insulin detemir (LEVEMIR) 100 UNIT/ML injection Inject 0.35 mLs (35 Units total) into the skin 2 (two) times daily. 01/15/17    Jimmy Footman, MD  lithium carbonate (LITHOBID) 300 MG CR tablet Take 2 tablets (600 mg total) by mouth every 12 (twelve) hours. Patient not taking: Reported on 01/18/2017 01/15/17   Jimmy Footman, MD  paliperidone (INVEGA) 6 MG 24 hr tablet Take 2 tablets (12 mg total) by mouth at bedtime. 01/05/17   Jimmy Footman, MD  traMADol (ULTRAM) 50 MG tablet Take 1 tablet (50 mg total) by mouth every 6 (six) hours as needed for severe pain. 01/15/17   Jimmy Footman, MD    Allergies  Allergen Reactions  . Divalproex Sodium     REACTION: unspecified reaction    Family History  Problem Relation Age of Onset  . Cancer Mother        Breast, bone CA in pelvis and lower back  . Depression Mother        mild paranoid schizophrenic  . Diabetes Maternal Grandfather   . COPD Maternal Grandfather     Social History Social History  Substance Use Topics  . Smoking status: Current Some Day Smoker    Packs/day: 0.50    Years: 14.00    Types: Cigarettes  . Smokeless tobacco: Never Used  . Alcohol use No     Comment: Rarely    Review of Systems  Constitutional: Negative for fever. Eyes: Negative for visual changes. ENT: Negative for sore throat. Cardiovascular: Negative for chest pain. Respiratory: Negative for shortness of breath. Gastrointestinal: Negative for abdominal pain, vomiting and diarrhea. Genitourinary: Negative for dysuria. Musculoskeletal: Negative for back pain. Skin: Negative for rash. Neurological: Negative for headache.  ____________________________________________   PHYSICAL EXAM:  VITAL SIGNS: ED Triage Vitals  Enc Vitals Group     BP 01/18/17 1247 (!) 139/91     Pulse --      Resp 01/18/17 1247 18     Temp 01/18/17 1247 98.1 F (36.7 C)     Temp Source 01/18/17 1247 Oral     SpO2 01/18/17 1247 98 %     Weight --      Height --      Head Circumference --      Peak Flow --      Pain Score 01/18/17 1241 8      Pain Loc --      Pain Edu? --      Excl. in GC? --      Constitutional: Alert and Cooperative. Well appearing and in no distress. HEENT   Head: Normocephalic and atraumatic.      Eyes: Conjunctivae are normal. Pupils equal and round.       Ears:         Nose: No congestion/rhinnorhea.   Mouth/Throat: Mucous membranes are moist.   Neck: No stridor. Cardiovascular/Chest: Normal rate, regular rhythm.  No murmurs, rubs, or gallops. Respiratory: Normal respiratory effort without tachypnea nor retractions. Breath sounds are clear and equal bilaterally. No wheezes/rales/rhonchi. Gastrointestinal: Soft. No distention, no guarding,  no rebound. Nontender.    Genitourinary/rectal:Deferred Musculoskeletal: Nontender with normal range of motion in all extremities. No joint effusions.  No lower extremity tenderness.  No edema. Neurologic:  Normal speech and language. No gross or focal neurologic deficits are appreciated. Skin:  Skin is warm, dry and intact. No rash noted. Psychiatric: Mood and affect are slightly flat, but he does seem to be quite interactive.  Denies active suicidal ideation. Speech and behavior are normal. Patient exhibits appropriate insight and judgment.   ____________________________________________  LABS (pertinent positives/negatives)  Labs Reviewed  COMPREHENSIVE METABOLIC PANEL - Abnormal; Notable for the following:       Result Value   Glucose, Bld 112 (*)    All other components within normal limits  ACETAMINOPHEN LEVEL - Abnormal; Notable for the following:    Acetaminophen (Tylenol), Serum <10 (*)    All other components within normal limits  CBC - Abnormal; Notable for the following:    WBC 14.5 (*)    All other components within normal limits  ETHANOL  SALICYLATE LEVEL  URINE DRUG SCREEN, QUALITATIVE ([facility] ONLY)    ____________________________________________    EKG I, Governor Rooks, MD, the attending physician have personally viewed and  interpreted all ECGs.  None ____________________________________________  RADIOLOGY All Xrays were viewed by me. Imaging interpreted by Radiologist.  None __________________________________________  PROCEDURES  Procedure(s) performed: None  Critical Care performed: None  ____________________________________________   ED COURSE / ASSESSMENT AND PLAN  Pertinent labs & imaging results that were available during my care of the patient were reviewed by me and considered in my medical decision making (see chart for details).    Mr. Bill seems to have had some issue with depression over the last day or 2 since he's been discharged and is to have his medications, but here in the ER is fairly pleasant although a little flat in affect and is now reporting active suicidal ideation. He does however have a concerning history in terms of his psychiatric illness, prior overdoses, and recent long hospital stay for behavioral medicine. I discussed this with Dr. Toni Amend and he will come see the patient in the ED.  Patient care transferred to Dr. Roxan Hockey at shifting 4 PM. Dispo per psych.    CONSULTATIONS: Dr. Toni Amend to see patient for likely discharge today.  TTS consulted.  Patient / Family / Caregiver informed of clinical course, medical decision-making process, and agree with plan.  ___________________________________________   FINAL CLINICAL IMPRESSION(S) / ED DIAGNOSES   Final diagnoses:  Depression, unspecified depression type              Note: This dictation was prepared with Dragon dictation. Any transcriptional errors that result from this process are unintentional    Governor Rooks, MD 01/18/17 1554

## 2017-01-18 NOTE — ED Triage Notes (Signed)
[facility] - He was at a scheduled appt and he verbalized SI with a plan to cut his wrists   Recent discharge on 01/15/2017 after being admitted for 18 days pt verbalizes that he is unable to cope

## 2017-01-18 NOTE — ED Provider Notes (Signed)
The patient has been evaluated at bedside by Dr. Toni Amendlapacs, psychiatry.  Patient is clinically stable.  Not felt to be a danger to self or others.  No SI or Hi.  No indication for inpatient psychiatric admission at this time.  Appropriate for continued outpatient therapy.    Willy Eddyobinson, Vaida Kerchner, MD 01/18/17 [phone]

## 2017-03-30 ENCOUNTER — Emergency Department
Admission: EM | Admit: 2017-03-30 | Discharge: 2017-03-30 | Disposition: A | Discharge status: Home / Self Care | Payer: PPO | Attending: Emergency Medicine | Admitting: Emergency Medicine

## 2017-03-30 DIAGNOSIS — Z79899 Other long term (current) drug therapy: Secondary | ICD-10-CM | POA: Insufficient documentation

## 2017-03-30 DIAGNOSIS — F17210 Nicotine dependence, cigarettes, uncomplicated: Secondary | ICD-10-CM | POA: Diagnosis not present

## 2017-03-30 DIAGNOSIS — I10 Essential (primary) hypertension: Secondary | ICD-10-CM | POA: Diagnosis not present

## 2017-03-30 DIAGNOSIS — J449 Chronic obstructive pulmonary disease, unspecified: Secondary | ICD-10-CM | POA: Diagnosis not present

## 2017-03-30 DIAGNOSIS — Z794 Long term (current) use of insulin: Secondary | ICD-10-CM | POA: Insufficient documentation

## 2017-03-30 DIAGNOSIS — F250 Schizoaffective disorder, bipolar type: Secondary | ICD-10-CM | POA: Diagnosis present

## 2017-03-30 DIAGNOSIS — F319 Bipolar disorder, unspecified: Secondary | ICD-10-CM | POA: Insufficient documentation

## 2017-03-30 DIAGNOSIS — E119 Type 2 diabetes mellitus without complications: Secondary | ICD-10-CM | POA: Insufficient documentation

## 2017-03-30 LAB — COMPREHENSIVE METABOLIC PANEL
ALK PHOS: 94 U/L (ref 38–126)
ALT: 22 U/L (ref 17–63)
ANION GAP: 8 (ref 5–15)
AST: 24 U/L (ref 15–41)
Albumin: 4.6 g/dL (ref 3.5–5.0)
BILIRUBIN TOTAL: 0.5 mg/dL (ref 0.3–1.2)
BUN: 16 mg/dL (ref 6–20)
CO2: 24 mmol/L (ref 22–32)
Calcium: 9.3 mg/dL (ref 8.9–10.3)
Chloride: 104 mmol/L (ref 101–111)
Creatinine, Ser: 1.02 mg/dL (ref 0.61–1.24)
GFR calc Af Amer: >60 mL/min (ref >60)
GFR calc non Af Amer: >60 mL/min (ref >60)
GLUCOSE: 114 mg/dL — AB (ref 65–99)
POTASSIUM: 3.5 mmol/L (ref 3.5–5.1)
SODIUM: 136 mmol/L (ref 135–145)
TOTAL PROTEIN: 7.5 g/dL (ref 6.5–8.1)

## 2017-03-30 LAB — CBC WITH DIFFERENTIAL/PLATELET
Basophils Absolute: 0.1 10*3/uL (ref 0–0.1)
Basophils Relative: 1 %
Eosinophils Absolute: 0.2 10*3/uL (ref 0–0.7)
Eosinophils Relative: 1 %
HEMATOCRIT: 44 % (ref 40.0–52.0)
HEMOGLOBIN: 15.2 g/dL (ref 13.0–18.0)
LYMPHS PCT: 19 %
Lymphs Abs: 2.8 10*3/uL (ref 1.0–3.6)
MCH: 31 pg (ref 26.0–34.0)
MCHC: 34.5 g/dL (ref 32.0–36.0)
MCV: 89.7 fL (ref 80.0–100.0)
MONO ABS: 0.8 10*3/uL (ref 0.2–1.0)
MONOS PCT: 6 %
NEUTROS ABS: 10.8 10*3/uL — AB (ref 1.4–6.5)
NEUTROS PCT: 73 %
Platelets: 282 10*3/uL (ref 150–440)
RBC: 4.91 MIL/uL (ref 4.40–5.90)
RDW: 13.4 % (ref 11.5–14.5)
WBC: 14.7 10*3/uL — ABNORMAL HIGH (ref 3.8–10.6)

## 2017-03-30 LAB — ETHANOL: Alcohol, Ethyl (B): <5 mg/dL (ref <5)

## 2017-03-30 MED ORDER — LITHIUM CARBONATE 300 MG PO CAPS
600.0000 mg | ORAL_CAPSULE | Freq: Once | ORAL | Status: AC
  Administered 2017-03-30: 600 mg via ORAL
  Filled 2017-03-30: qty 2

## 2017-03-30 MED ORDER — PALIPERIDONE ER 6 MG PO TB24
12.0000 mg | ORAL_TABLET | Freq: Every evening | ORAL | 0 refills | Status: DC
Start: 2017-03-30 — End: 2024-05-14

## 2017-03-30 MED ORDER — LITHIUM CARBONATE ER 300 MG PO TBCR: 600.0000 mg | EXTENDED_RELEASE_TABLET | Freq: Two times a day (BID) | ORAL | 0 refills | Status: DC

## 2017-03-30 MED ORDER — PALIPERIDONE ER 3 MG PO TB24
12.0000 mg | ORAL_TABLET | Freq: Once | ORAL | Status: AC
  Administered 2017-03-30: 12 mg via ORAL
  Filled 2017-03-30: qty 2

## 2017-03-30 NOTE — Consult Note (Signed)
[facility] Consult   Reason for Consult:  Consult for 51 year old man with a history of schizoaffective disorder who came voluntarily in seeking help with his medicine Referring Physician:  Corky Downs Patient Identification: [name] MRN:  [id number] Principal Diagnosis: Schizoaffective disorder, bipolar type [facility]) Diagnosis:   Patient Active Problem List   Diagnosis Date Noted  . Adjustment disorder with mixed disturbance of emotions and conduct [F43.25] 01/18/2017  . Hyperlipemia [E78.5] 01/01/2017  . COPD (chronic obstructive pulmonary disease) ([place]) [J44.9] 01/01/2017  . Chronic back pain [M54.9, G89.29] 01/01/2017  . Schizoaffective disorder, bipolar type ([place]) [F25.0] 12/29/2016  . OBSTRUCTIVE SLEEP APNEA [G47.33] 05/13/2007  . Diabetes ([place]) [E11.9] 02/13/2007  . OBESITY [E66.9] 02/13/2007  . Hypertension [I10] 02/13/2007    Total Time spent with patient: 1 hour  Subjective:   [name] is a 51 y.o. male patient admitted with "I'm feeling better than I was a while ago".  HPI:  Patient interviewed chart reviewed. Patient known from previous encounters. 51 year old man with a history of schizoaffective disorder. He called his father today and asked him to bring him over to the hospital. He was up all last night not sleeping. Had been feeling anxious and panicky. Patient admits that he has been taking less of his medicine than he should. He said he was running out of it and so he was rationing his medicine and had been off it for 2 or 3 days. Specifically the lithium and the Invega. On interview today the patient denies any wish to die or thought about killing himself. Denies any thought of violence. Denies that he's having any hallucinations. He does show some signs of a little mood lability. He cried easily during the interview. His thoughts however remained organized and there was no gross psychosis or bizarre behavior. Patient claimed that his  doctor in [place] had changed his medicines after he was discharged from here. Comparing what he said versus the notes in the chart it sounds like he might be a little confused. He says his doctor in [place] stopped his clonazepam but it looks like that was actually stopped here in the hospital. He says his lithium dose had been decreased but in fact the recent dose he had been taking was the 600 mg twice a day and not the 450.  Social history: Patient has his own apartment. Not married. Very little social contact. He has a 37-year-old daughter by his previous marriage and he treasures this relationship but he gets to see the child rather infrequently.  Medical history: Diabetes, hypertension, dyslipidemia, chronic back pain  Substance abuse history: Does not drink or use any drugs  Past Psychiatric History: Patient has a long history of chronic mental illness and has had several hospitalizations in the past most recently he was here over the summer. He has been maintained on antipsychotics and mood stabilizers. He does have a history of suicidality in the past. Currently he seems to be devoted to trying to be compliant with his medicine.  Risk to Self: Is patient at risk for suicide?: Yes Risk to Others:   Prior Inpatient Therapy:   Prior Outpatient Therapy:    Past Medical History:  Past Medical History:  Diagnosis Date  . 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 Alcohol abuse   . Bipolar disorder ([place])    [facility] psych admission 12/2011 for SI  . BRBPR (bright red blood per rectum) 05/30 -  01/01/07   [facility]  . COPD (chronic obstructive pulmonary disease) ([place])   . Depression   . Diabetes mellitus    Type II  . ED (erectile dysfunction)   . GI bleed 06/15 - 01/20/07   [facility] ,  NSaids, anemia  . Hyperlipidemia   . Hypertension   . Lithium toxicity 7/1 - 02/02/07   [place]  . OSA (obstructive sleep apnea)   . Overdose    Episodes in the  past (2)  . Plantar fasciitis    3 Cortisone shots in heel (Dr. Milinda Pointer)  . S/P endoscopy 01/16/07   Capsule, bleeding in small bowel  . Schizoaffective disorder     Past Surgical History:  Procedure Laterality Date  . BACK SURGERY  05/28/07   L4-S1 fusing, pins, screws and rods (Dr. Louanne Skye)  . DOPPLER ECHOCARDIOGRAPHY  10/20/04   Normal   Family History:  Family History  Problem Relation Age of Onset  . Cancer Mother        Breast, bone CA in pelvis and lower back  . Depression Mother        mild paranoid schizophrenic  . Diabetes Maternal Grandfather   . COPD Maternal Grandfather    Family Psychiatric  History: Positive for mood disorder Social History:  History  Alcohol Use No    Comment: Rarely     History  Drug Use No    Social History   Social History  . Marital status: Divorced    Spouse name: N/A  . Number of children: 1  . Years of education: N/A   Occupational History  . Machinist, Film/video editor. Disabled  . Disability from back surgery    Social History Main Topics  . Smoking status: Current Some Day Smoker    Packs/day: 0.50    Years: 14.00    Types: Cigarettes  . Smokeless tobacco: Never Used  . Alcohol use No     Comment: Rarely  . Drug use: No  . Sexual activity: Not on file   Other Topics Concern  . Not on file   Social History Narrative  . No narrative on file   Additional Social History:    Allergies:   Allergies  Allergen Reactions  . Divalproex Sodium     REACTION: unspecified reaction    Labs:  Results for orders placed or performed during the hospital encounter of 03/30/17 (from the past 48 hour(s))  Comprehensive metabolic panel     Status: Abnormal   Collection Time: 03/30/17 11:20 AM  Result Value Ref Range   Sodium 136 135 - 145 mmol/L   Potassium 3.5 3.5 - 5.1 mmol/L   Chloride 104 101 - 111 mmol/L   CO2 24 22 - 32 mmol/L   Glucose, Bld 114 (H) 65 - 99 mg/dL   BUN 16 6 - 20 mg/dL   Creatinine, Ser 1.02  0.61 - 1.24 mg/dL   Calcium 9.3 8.9 - 10.3 mg/dL   Total Protein 7.5 6.5 - 8.1 g/dL   Albumin 4.6 3.5 - 5.0 g/dL   AST 24 15 - 41 U/L   ALT 22 17 - 63 U/L   Alkaline Phosphatase 94 38 - 126 U/L   Total Bilirubin 0.5 0.3 - 1.2 mg/dL   GFR calc non Af Amer >60 >60 mL/min   GFR calc Af Amer >60 >60 mL/min    Comment: (NOTE) The eGFR has been calculated using the CKD EPI equation. This calculation has not been validated in  all clinical situations. eGFR's persistently <60 mL/min signify possible Chronic Kidney Disease.    Anion gap 8 5 - 15  Ethanol     Status: None   Collection Time: 03/30/17 11:20 AM  Result Value Ref Range   Alcohol, Ethyl (B) <5 <5 mg/dL    Comment:        LOWEST DETECTABLE LIMIT FOR SERUM ALCOHOL IS 5 mg/dL FOR MEDICAL PURPOSES ONLY   CBC with Diff     Status: Abnormal   Collection Time: 03/30/17 11:20 AM  Result Value Ref Range   WBC 14.7 (H) 3.8 - 10.6 K/uL   RBC 4.91 4.40 - 5.90 MIL/uL   Hemoglobin 15.2 13.0 - 18.0 g/dL   HCT 44.0 40.0 - 52.0 %   MCV 89.7 80.0 - 100.0 fL   MCH 31.0 26.0 - 34.0 pg   MCHC 34.5 32.0 - 36.0 g/dL   RDW 13.4 11.5 - 14.5 %   Platelets 282 150 - 440 K/uL   Neutrophils Relative % 73 %   Neutro Abs 10.8 (H) 1.4 - 6.5 K/uL   Lymphocytes Relative 19 %   Lymphs Abs 2.8 1.0 - 3.6 K/uL   Monocytes Relative 6 %   Monocytes Absolute 0.8 0.2 - 1.0 K/uL   Eosinophils Relative 1 %   Eosinophils Absolute 0.2 0 - 0.7 K/uL   Basophils Relative 1 %   Basophils Absolute 0.1 0 - 0.1 K/uL    No current facility-administered medications for this encounter.    Current Outpatient Prescriptions  Medication Sig Dispense Refill  . amLODipine (NORVASC) 10 MG tablet Take 1 tablet (10 mg total) by mouth daily. 30 tablet 0  . citalopram (CELEXA) 40 MG tablet Take 1 tablet (40 mg total) by mouth daily. 30 tablet 0  . clonazePAM (KLONOPIN) 0.5 MG tablet Take 1 tablet (0.5 mg total) by mouth 3 (three) times daily as needed for anxiety. 45 tablet 0   . gabapentin (NEURONTIN) 400 MG capsule Take 2 capsules (800 mg total) by mouth 3 (three) times daily. 180 capsule 0  . insulin detemir (LEVEMIR) 100 UNIT/ML injection Inject 0.35 mLs (35 Units total) into the skin 2 (two) times daily. 21 mL 0  . lithium carbonate (ESKALITH) 450 MG CR tablet Take 450 mg by mouth 2 (two) times daily.  0  . lithium carbonate (LITHOBID) 300 MG CR tablet Take 2 tablets (600 mg total) by mouth every 12 (twelve) hours. 120 tablet 0  . metFORMIN (GLUCOPHAGE) 1000 MG tablet Take 1 tablet (1,000 mg total) by mouth 2 (two) times daily with a meal. 60 tablet 0  . methocarbamol (ROBAXIN) 750 MG tablet Take 1 tablet (750 mg total) by mouth every 8 (eight) hours as needed for muscle spasms. 45 tablet 0  . paliperidone (INVEGA) 6 MG 24 hr tablet Take 2 tablets (12 mg total) by mouth at bedtime. 60 tablet 0  . paliperidone (INVEGA) 6 MG 24 hr tablet Take 2 tablets (12 mg total) by mouth every evening. 60 tablet 0  . risperiDONE (RISPERDAL) 2 MG tablet Take 2 mg by mouth at bedtime.  0  . traMADol (ULTRAM) 50 MG tablet Take 1 tablet (50 mg total) by mouth every 6 (six) hours as needed for severe pain. 30 tablet 0  . traZODone (DESYREL) 150 MG tablet Take 1 tablet (150 mg total) by mouth at bedtime. 30 tablet 0    Musculoskeletal: Strength & Muscle Tone: within normal limits Gait & Station: normal Patient leans: N/A  Psychiatric Specialty Exam: Physical Exam  Nursing note and vitals reviewed. Constitutional: He appears well-developed and well-nourished.  HENT:  Head: Normocephalic and atraumatic.  Eyes: Pupils are equal, round, and reactive to light. Conjunctivae are normal.  Neck: Normal range of motion.  Cardiovascular: Regular rhythm and normal heart sounds.   Respiratory: Effort normal and breath sounds normal. No respiratory distress.  GI: Soft.  Musculoskeletal: Normal range of motion.  Neurological: He is alert.  Skin: Skin is warm and dry.  Psychiatric: His  speech is normal and behavior is normal. His mood appears anxious. Thought content is not delusional. Cognition and memory are normal. He expresses impulsivity. He expresses no homicidal and no suicidal ideation.    Review of Systems  Constitutional: Negative.   HENT: Negative.   Eyes: Negative.   Respiratory: Negative.   Cardiovascular: Negative.   Gastrointestinal: Negative.   Musculoskeletal: Negative.   Skin: Negative.   Neurological: Negative.   Psychiatric/Behavioral: Positive for depression. Negative for hallucinations, memory loss, substance abuse and suicidal ideas. The patient is nervous/anxious and has insomnia.     Blood pressure (!) 136/95, pulse (!) 102, temperature 98.2 F (36.8 C), temperature source Oral, resp. rate 18, height _0  (1.727 m), weight 230 lb (104.3 kg), SpO2 99 %.Body mass index is 34.97 kg/m.  General Appearance: Casual  Eye Contact:  Fair  Speech:  Slow  Volume:  Decreased  Mood:  Dysphoric  Affect:  Congruent  Thought Process:  Goal Directed  Orientation:  Full (Time, Place, and Person)  Thought Content:  Logical  Suicidal Thoughts:  No  Homicidal Thoughts:  No  Memory:  Immediate;   Good Recent;   Fair Remote;   Fair  Judgement:  Fair  Insight:  Fair  Psychomotor Activity:  Normal  Concentration:  Concentration: Fair  Recall:  [employer] of Knowledge:  Fair  Language:  Fair  Akathisia:  No  Handed:  Right  AIMS (if indicated):     Assets:  Communication Skills Desire for Improvement Financial Resources/Insurance Housing Resilience  ADL's:  Intact  Cognition:  WNL  Sleep:        Treatment Plan Summary: Medication management and Plan Continue doses as prescribed today note below. Patient will not be admitted to the psychiatric unit at this time but I have made arrangements to restock his medicine for him so he can follow-up as an outpatient  Disposition: Medication management and Plan The patient and I discussed some options  about how we could help him. We acknowledge that admission to the hospital would be one option but at this point he does not meet commitment criteria and he states that he would prefer not to be admitted. He has been taking his medication and has pretty good insight about his illness. After reviewing his recent notes I suggested that we first of all give him 600 mg of lithium and 12 mg of Invega here in the emergency room before he leaves and then that I give him prescriptions for lithium 600 twice a day and his antipsychotic 12 mg at night. Patient will then be able to follow-up with his outpatient psychiatrist in [place]. He is agreeable to the plan and knows he can come back to the hospital any time. Case reviewed with emergency room physician and TTS.  Alethia Berthold, MD 03/30/2017 2:50 PM

## 2017-03-30 NOTE — ED Triage Notes (Signed)
Patient presents [facility] ED via POV (father brought him to the hospital). Patient states:  "I'm sick of this shit, im sick of not being able to manage my own life, Maxwell Atlasve wanted to kill myself a lot recently but not now.... Not now.."  Patient is rambling, displaying flight of ideas and incoherent thinking.   Patient clearly states that he does not want to harm anyone else, just, occasionally, himself.

## 2017-03-30 NOTE — ED Provider Notes (Signed)
[facility] Emergency Department Provider Note   ____________________________________________    I have reviewed the triage vital signs and the nursing notes.   HISTORY  Chief Complaint Psychiatric Evaluation     HPI [name] is a 51 y.o. male Who is here for evaluation, he feels he needs medication adjustment. Patient reports a history of bipolar disorder and states he has difficulty managing his life, he reports his PCP recently decreased some of his psychiatric meds which he says has made him feel much worseand has made it difficult for him to cope. He says he is not suicidal "today ". He requests to speak to psychiatry. Denies ingestions or self harm.   Past Medical History:  Diagnosis Date  . 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 Alcohol abuse   . Bipolar disorder (H[facility])    [facility] psych admission 12/2011 for SI  . BRBPR (bright red blood per rectum) 05/30 - 01/01/07   [facility]  . COPD (chronic obstructive pulmonary disease) ([facility])   . Depression   . Diabetes mellitus    Type II  . ED (erectile dysfunction)   . GI bleed 06/15 - 01/20/07   [facility] ,  NSaids, anemia  . Hyperlipidemia   . Hypertension   . Lithium toxicity 7/1 - 02/02/07   [facility]  . OSA (obstructive sleep apnea)   . Overdose    Episodes in the past (2)  . Plantar fasciitis    3 Cortisone shots in heel (Dr. Al Corpus)  . S/P endoscopy 01/16/07   Capsule, bleeding in small bowel  . Schizoaffective disorder     Patient Active Problem List   Diagnosis Date Noted  . Adjustment disorder with mixed disturbance of emotions and conduct 01/18/2017  . Hyperlipemia 01/01/2017  . COPD (chronic obstructive pulmonary disease) (H[facility]) 01/01/2017  . Chronic back pain 01/01/2017  . Schizoaffective disorder, bipolar type (H[facility]) 12/29/2016  . OBSTRUCTIVE SLEEP APNEA 05/13/2007  . Diabetes (HCC) 02/13/2007  . OBESITY 02/13/2007  . Hypertension  02/13/2007    Past Surgical History:  Procedure Laterality Date  . BACK SURGERY  05/28/07   L4-S1 fusing, pins, screws and rods (Dr. Otelia Sergeant)  . DOPPLER ECHOCARDIOGRAPHY  10/20/04   Normal    Prior to Admission medications   Medication Sig Start Date End Date Taking? Authorizing Provider  amLODipine (NORVASC) 10 MG tablet Take 1 tablet (10 mg total) by mouth daily. 01/06/17   Jimmy Footman, MD  citalopram (CELEXA) 40 MG tablet Take 1 tablet (40 mg total) by mouth daily. 01/15/17   Jimmy Footman, MD  clonazePAM (KLONOPIN) 0.5 MG tablet Take 1 tablet (0.5 mg total) by mouth 3 (three) times daily as needed for anxiety. 01/15/17   Jimmy Footman, MD  gabapentin (NEURONTIN) 400 MG capsule Take 2 capsules (800 mg total) by mouth 3 (three) times daily. 01/15/17   Jimmy Footman, MD  insulin detemir (LEVEMIR) 100 UNIT/ML injection Inject 0.35 mLs (35 Units total) into the skin 2 (two) times daily. 01/15/17   Jimmy Footman, MD  lithium carbonate (ESKALITH) 450 MG CR tablet Take 450 mg by mouth 2 (two) times daily. 01/17/17   [provider]  lithium carbonate (LITHOBID) 300 MG CR tablet Take 2 tablets (600 mg total) by mouth every 12 (twelve) hours. 03/30/17   Clapacs, Jackquline Denmark, MD  metFORMIN (GLUCOPHAGE) 1000 MG tablet Take 1 tablet (1,000 mg total) by mouth 2 (two) times  daily with a meal. 01/05/17   Jimmy Footman, MD  methocarbamol (ROBAXIN) 750 MG tablet Take 1 tablet (750 mg total) by mouth every 8 (eight) hours as needed for muscle spasms. 01/05/17   Jimmy Footman, MD  paliperidone (INVEGA) 6 MG 24 hr tablet Take 2 tablets (12 mg total) by mouth at bedtime. 01/05/17   Jimmy Footman, MD  paliperidone (INVEGA) 6 MG 24 hr tablet Take 2 tablets (12 mg total) by mouth every evening. 03/30/17   Clapacs, Jackquline Denmark, MD  risperiDONE (RISPERDAL) 2 MG tablet Take 2 mg by mouth at bedtime. 10/14/16   [provider]  traMADol (ULTRAM) 50 MG tablet Take 1 tablet (50 mg total) by mouth every 6 (six) hours as needed for severe pain. 01/15/17   Jimmy Footman, MD  traZODone (DESYREL) 150 MG tablet Take 1 tablet (150 mg total) by mouth at bedtime. 01/05/17   Jimmy Footman, MD     Allergies Divalproex sodium  Family History  Problem Relation Age of Onset  . Cancer Mother        Breast, bone CA in pelvis and lower back  . Depression Mother        mild paranoid schizophrenic  . Diabetes Maternal Grandfather   . COPD Maternal Grandfather     Social History Social History  Substance Use Topics  . Smoking status: Current Some Day Smoker    Packs/day: 0.50    Years: 14.00    Types: Cigarettes  . Smokeless tobacco: Never Used  . Alcohol use No     Comment: Rarely    Review of Systems  Constitutional: No fever/chills Eyes: No visual changes.  ENT: No sore throat. Cardiovascular: Denies chest pain. Respiratory: Denies shortness of breath. Gastrointestinal: No abdominal pain.   Genitourinary: Negative for dysuria. Musculoskeletal: Negative for back pain. Skin: Negative for rash. Neurological: Negative for headaches   ____________________________________________   PHYSICAL EXAM:  VITAL SIGNS: ED Triage Vitals  Enc Vitals Group     BP 03/30/17 1104 (!) 159/89     Pulse Rate 03/30/17 1104 (!) 109     Resp 03/30/17 1104 18     Temp 03/30/17 1104 98.7 F (37.1 C)     Temp Source 03/30/17 1104 Oral     SpO2 03/30/17 1104 99 %     Weight 03/30/17 1105 104.3 kg (230 lb)     Height 03/30/17 1105 1.727 m (5\' 8" )     Head Circumference --      Peak Flow --      Pain Score --      Pain Loc --      Pain Edu? --      Excl. in GC? --     Constitutional: Alert and oriented. No acute distress. Pleasant and interactive Eyes: Conjunctivae are normal.  . Nose: No congestion/rhinnorhea. Mouth/Throat: Mucous membranes are moist.    Cardiovascular: Normal  rate, regular rhythm. Grossly normal heart sounds.  Good peripheral circulation. Respiratory: Normal respiratory effort.  No retractions. Lungs CTAB. Gastrointestinal: Soft and nontender. No distention.  No CVA tenderness. Genitourinary: deferred Musculoskeletal: No lower extremity tenderness nor edema.  Warm and well perfused Neurologic:  Normal speech and language. No gross focal neurologic deficits are appreciated.  Skin:  Skin is warm, dry and intact. No rash noted. Psychiatric: Mood and affect are normal. Speech and behavior are normal.  ____________________________________________   LABS (all labs ordered are listed, but only abnormal results are displayed)  Labs Reviewed  COMPREHENSIVE  METABOLIC PANEL - Abnormal; Notable for the following:       Result Value   Glucose, Bld 114 (*)    All other components within normal limits  CBC WITH DIFFERENTIAL/PLATELET - Abnormal; Notable for the following:    WBC 14.7 (*)    Neutro Abs 10.8 (*)    All other components within normal limits  ETHANOL  URINE DRUG SCREEN, QUALITATIVE ([facility] ONLY)   ____________________________________________  EKG  None ____________________________________________  RADIOLOGY  None ____________________________________________   PROCEDURES  Procedure(s) performed: No    Critical Care performed:No ____________________________________________   INITIAL IMPRESSION / ASSESSMENT AND PLAN / ED COURSE  Pertinent labs & imaging results that were available during my care of the patient were reviewed by me and considered in my medical decision making (see chart for details).  Patient well-appearing overall, I do not feel that he is a danger to himself and does not meet criteria for involuntary commitment. I have consulted Dr. Toni Amendlapacs to see the patient   Dr. Toni Amendlapacs has given the patient prescriptions and cleared him for discharge    ____________________________________________   FINAL  CLINICAL IMPRESSION(S) / ED DIAGNOSES  Final diagnoses:  Bipolar disorder without psychotic features [facility](HCC)      NEW MEDICATIONS STARTED DURING THIS VISIT:  Discharge Medication List as of 03/30/2017  1:04 PM       Note:  This document was prepared using Dragon voice recognition software and may include unintentional dictation errors.    Jene EveryKinner, Macklen Wilhoite, MD 03/30/17 [phone]

## 2017-04-19 DIAGNOSIS — F411 Generalized anxiety disorder: Secondary | ICD-10-CM | POA: Diagnosis not present

## 2017-04-19 DIAGNOSIS — F250 Schizoaffective disorder, bipolar type: Secondary | ICD-10-CM | POA: Diagnosis not present

## 2017-04-19 DIAGNOSIS — F4310 Post-traumatic stress disorder, unspecified: Secondary | ICD-10-CM | POA: Diagnosis not present

## 2017-08-16 DIAGNOSIS — F411 Generalized anxiety disorder: Secondary | ICD-10-CM | POA: Diagnosis not present

## 2017-08-16 DIAGNOSIS — F250 Schizoaffective disorder, bipolar type: Secondary | ICD-10-CM | POA: Diagnosis not present

## 2017-08-16 DIAGNOSIS — F4310 Post-traumatic stress disorder, unspecified: Secondary | ICD-10-CM | POA: Diagnosis not present

## 2017-11-15 DIAGNOSIS — F411 Generalized anxiety disorder: Secondary | ICD-10-CM | POA: Diagnosis not present

## 2017-11-15 DIAGNOSIS — F250 Schizoaffective disorder, bipolar type: Secondary | ICD-10-CM | POA: Diagnosis not present

## 2017-11-15 DIAGNOSIS — F4310 Post-traumatic stress disorder, unspecified: Secondary | ICD-10-CM | POA: Diagnosis not present

## 2017-12-18 DIAGNOSIS — Z23 Encounter for immunization: Secondary | ICD-10-CM | POA: Diagnosis not present

## 2017-12-18 DIAGNOSIS — Z0000 Encounter for general adult medical examination without abnormal findings: Secondary | ICD-10-CM | POA: Diagnosis not present

## 2017-12-18 DIAGNOSIS — E119 Type 2 diabetes mellitus without complications: Secondary | ICD-10-CM | POA: Diagnosis not present

## 2017-12-18 DIAGNOSIS — Z1389 Encounter for screening for other disorder: Secondary | ICD-10-CM | POA: Diagnosis not present

## 2017-12-18 DIAGNOSIS — Z1211 Encounter for screening for malignant neoplasm of colon: Secondary | ICD-10-CM | POA: Diagnosis not present

## 2017-12-18 DIAGNOSIS — E1140 Type 2 diabetes mellitus with diabetic neuropathy, unspecified: Secondary | ICD-10-CM | POA: Diagnosis not present

## 2017-12-18 DIAGNOSIS — R7309 Other abnormal glucose: Secondary | ICD-10-CM | POA: Diagnosis not present

## 2017-12-18 DIAGNOSIS — Z1159 Encounter for screening for other viral diseases: Secondary | ICD-10-CM | POA: Diagnosis not present

## 2018-02-13 DIAGNOSIS — F4310 Post-traumatic stress disorder, unspecified: Secondary | ICD-10-CM | POA: Diagnosis not present

## 2018-02-13 DIAGNOSIS — F250 Schizoaffective disorder, bipolar type: Secondary | ICD-10-CM | POA: Diagnosis not present

## 2018-02-13 DIAGNOSIS — F411 Generalized anxiety disorder: Secondary | ICD-10-CM | POA: Diagnosis not present

## 2018-04-05 DIAGNOSIS — F250 Schizoaffective disorder, bipolar type: Secondary | ICD-10-CM | POA: Diagnosis not present

## 2018-04-05 DIAGNOSIS — E669 Obesity, unspecified: Secondary | ICD-10-CM | POA: Diagnosis not present

## 2018-04-05 DIAGNOSIS — E119 Type 2 diabetes mellitus without complications: Secondary | ICD-10-CM | POA: Diagnosis not present

## 2018-04-23 DIAGNOSIS — E119 Type 2 diabetes mellitus without complications: Secondary | ICD-10-CM | POA: Diagnosis not present

## 2018-04-23 DIAGNOSIS — Z0000 Encounter for general adult medical examination without abnormal findings: Secondary | ICD-10-CM | POA: Diagnosis not present

## 2018-04-23 DIAGNOSIS — F250 Schizoaffective disorder, bipolar type: Secondary | ICD-10-CM | POA: Diagnosis not present

## 2018-04-23 DIAGNOSIS — E1140 Type 2 diabetes mellitus with diabetic neuropathy, unspecified: Secondary | ICD-10-CM | POA: Diagnosis not present

## 2018-04-23 DIAGNOSIS — Z1389 Encounter for screening for other disorder: Secondary | ICD-10-CM | POA: Diagnosis not present

## 2018-04-23 DIAGNOSIS — R7309 Other abnormal glucose: Secondary | ICD-10-CM | POA: Diagnosis not present

## 2018-05-01 DIAGNOSIS — E119 Type 2 diabetes mellitus without complications: Secondary | ICD-10-CM | POA: Diagnosis not present

## 2018-05-01 DIAGNOSIS — I10 Essential (primary) hypertension: Secondary | ICD-10-CM | POA: Diagnosis not present

## 2018-05-17 DIAGNOSIS — Z1389 Encounter for screening for other disorder: Secondary | ICD-10-CM | POA: Diagnosis not present

## 2018-05-17 DIAGNOSIS — R600 Localized edema: Secondary | ICD-10-CM | POA: Diagnosis not present

## 2018-05-17 DIAGNOSIS — F250 Schizoaffective disorder, bipolar type: Secondary | ICD-10-CM | POA: Diagnosis not present

## 2018-05-20 DIAGNOSIS — F411 Generalized anxiety disorder: Secondary | ICD-10-CM | POA: Diagnosis not present

## 2018-05-20 DIAGNOSIS — F4310 Post-traumatic stress disorder, unspecified: Secondary | ICD-10-CM | POA: Diagnosis not present

## 2018-05-20 DIAGNOSIS — F250 Schizoaffective disorder, bipolar type: Secondary | ICD-10-CM | POA: Diagnosis not present

## 2018-06-20 DIAGNOSIS — I10 Essential (primary) hypertension: Secondary | ICD-10-CM | POA: Diagnosis not present

## 2018-06-20 DIAGNOSIS — E119 Type 2 diabetes mellitus without complications: Secondary | ICD-10-CM | POA: Diagnosis not present

## 2018-07-22 DIAGNOSIS — I10 Essential (primary) hypertension: Secondary | ICD-10-CM | POA: Diagnosis not present

## 2018-07-22 DIAGNOSIS — E119 Type 2 diabetes mellitus without complications: Secondary | ICD-10-CM | POA: Diagnosis not present

## 2018-09-18 DIAGNOSIS — F4310 Post-traumatic stress disorder, unspecified: Secondary | ICD-10-CM | POA: Diagnosis not present

## 2018-09-18 DIAGNOSIS — F250 Schizoaffective disorder, bipolar type: Secondary | ICD-10-CM | POA: Diagnosis not present

## 2018-09-18 DIAGNOSIS — R45851 Suicidal ideations: Secondary | ICD-10-CM | POA: Diagnosis not present

## 2018-09-18 DIAGNOSIS — F411 Generalized anxiety disorder: Secondary | ICD-10-CM | POA: Diagnosis not present

## 2018-10-24 DIAGNOSIS — E119 Type 2 diabetes mellitus without complications: Secondary | ICD-10-CM | POA: Diagnosis not present

## 2018-10-24 DIAGNOSIS — I10 Essential (primary) hypertension: Secondary | ICD-10-CM | POA: Diagnosis not present

## 2018-11-13 DIAGNOSIS — F4310 Post-traumatic stress disorder, unspecified: Secondary | ICD-10-CM | POA: Diagnosis not present

## 2018-11-13 DIAGNOSIS — F250 Schizoaffective disorder, bipolar type: Secondary | ICD-10-CM | POA: Diagnosis not present

## 2018-11-13 DIAGNOSIS — R45851 Suicidal ideations: Secondary | ICD-10-CM | POA: Diagnosis not present

## 2018-11-13 DIAGNOSIS — F411 Generalized anxiety disorder: Secondary | ICD-10-CM | POA: Diagnosis not present

## 2018-12-24 DIAGNOSIS — E119 Type 2 diabetes mellitus without complications: Secondary | ICD-10-CM | POA: Diagnosis not present

## 2018-12-24 DIAGNOSIS — R45851 Suicidal ideations: Secondary | ICD-10-CM | POA: Diagnosis not present

## 2019-01-08 DIAGNOSIS — E119 Type 2 diabetes mellitus without complications: Secondary | ICD-10-CM | POA: Diagnosis not present

## 2019-02-11 DIAGNOSIS — I10 Essential (primary) hypertension: Secondary | ICD-10-CM | POA: Diagnosis not present

## 2019-02-11 DIAGNOSIS — E119 Type 2 diabetes mellitus without complications: Secondary | ICD-10-CM | POA: Diagnosis not present

## 2019-02-13 DIAGNOSIS — F4310 Post-traumatic stress disorder, unspecified: Secondary | ICD-10-CM | POA: Diagnosis not present

## 2019-02-13 DIAGNOSIS — F250 Schizoaffective disorder, bipolar type: Secondary | ICD-10-CM | POA: Diagnosis not present

## 2019-02-13 DIAGNOSIS — F411 Generalized anxiety disorder: Secondary | ICD-10-CM | POA: Diagnosis not present

## 2019-03-04 DIAGNOSIS — E119 Type 2 diabetes mellitus without complications: Secondary | ICD-10-CM | POA: Diagnosis not present

## 2019-03-04 DIAGNOSIS — I10 Essential (primary) hypertension: Secondary | ICD-10-CM | POA: Diagnosis not present

## 2019-04-02 DIAGNOSIS — I10 Essential (primary) hypertension: Secondary | ICD-10-CM | POA: Diagnosis not present

## 2019-04-02 DIAGNOSIS — E119 Type 2 diabetes mellitus without complications: Secondary | ICD-10-CM | POA: Diagnosis not present

## 2019-04-28 DIAGNOSIS — R7309 Other abnormal glucose: Secondary | ICD-10-CM | POA: Diagnosis not present

## 2019-04-28 DIAGNOSIS — Z23 Encounter for immunization: Secondary | ICD-10-CM | POA: Diagnosis not present

## 2019-04-28 DIAGNOSIS — Z0000 Encounter for general adult medical examination without abnormal findings: Secondary | ICD-10-CM | POA: Diagnosis not present

## 2019-04-28 DIAGNOSIS — E119 Type 2 diabetes mellitus without complications: Secondary | ICD-10-CM | POA: Diagnosis not present

## 2019-04-28 DIAGNOSIS — Z1389 Encounter for screening for other disorder: Secondary | ICD-10-CM | POA: Diagnosis not present

## 2019-05-14 DIAGNOSIS — F250 Schizoaffective disorder, bipolar type: Secondary | ICD-10-CM | POA: Diagnosis not present

## 2019-05-14 DIAGNOSIS — F4310 Post-traumatic stress disorder, unspecified: Secondary | ICD-10-CM | POA: Diagnosis not present

## 2019-05-14 DIAGNOSIS — F411 Generalized anxiety disorder: Secondary | ICD-10-CM | POA: Diagnosis not present

## 2019-05-14 DIAGNOSIS — I10 Essential (primary) hypertension: Secondary | ICD-10-CM | POA: Diagnosis not present

## 2019-06-24 DIAGNOSIS — E119 Type 2 diabetes mellitus without complications: Secondary | ICD-10-CM | POA: Diagnosis not present

## 2019-06-24 DIAGNOSIS — I10 Essential (primary) hypertension: Secondary | ICD-10-CM | POA: Diagnosis not present

## 2019-07-17 ENCOUNTER — Telehealth: Payer: Self-pay | Admitting: Specialist

## 2019-07-17 NOTE — Telephone Encounter (Signed)
Patient called requesting new handicap placard. Old one expired. Patient phone number is [phone].

## 2019-07-17 NOTE — Telephone Encounter (Signed)
I called and spoke with patient, I advised that since we have not seen him since 2012, that we can not give him a placard at this time.  I advised him to call his PCP as he sees them on a regular basis and hopefully they could give him one.

## 2019-07-18 DIAGNOSIS — E119 Type 2 diabetes mellitus without complications: Secondary | ICD-10-CM | POA: Diagnosis not present

## 2019-07-18 DIAGNOSIS — I10 Essential (primary) hypertension: Secondary | ICD-10-CM | POA: Diagnosis not present

## 2019-08-08 DIAGNOSIS — F4310 Post-traumatic stress disorder, unspecified: Secondary | ICD-10-CM | POA: Diagnosis not present

## 2019-08-08 DIAGNOSIS — F250 Schizoaffective disorder, bipolar type: Secondary | ICD-10-CM | POA: Diagnosis not present

## 2019-08-08 DIAGNOSIS — E119 Type 2 diabetes mellitus without complications: Secondary | ICD-10-CM | POA: Diagnosis not present

## 2019-08-08 DIAGNOSIS — F411 Generalized anxiety disorder: Secondary | ICD-10-CM | POA: Diagnosis not present

## 2019-08-20 ENCOUNTER — Telehealth: Payer: Self-pay | Admitting: Specialist

## 2019-08-20 NOTE — Telephone Encounter (Signed)
Patient called.   He would like someone to follow up with him about his handicap sticker paperwork that was sent over.   Call back: [phone]

## 2019-08-20 NOTE — Telephone Encounter (Signed)
I called and spoke with patient again and he states that no one called him back, I advsied him that I did call him back and told him what I said, "I advised that since we have not seen him since 2012, that we can not give him a placard at this time.  I advised him to call his PCP as he sees them on a regular basis and hopefully they could give him one.", he then remembered me calling him back and telling him this.  He states that he has had a lot going on recently and apologized for bothering me, I advised that this was no bother at all.

## 2019-08-28 DIAGNOSIS — Z1211 Encounter for screening for malignant neoplasm of colon: Secondary | ICD-10-CM | POA: Diagnosis not present

## 2019-09-01 DIAGNOSIS — I10 Essential (primary) hypertension: Secondary | ICD-10-CM | POA: Diagnosis not present

## 2019-09-01 DIAGNOSIS — E119 Type 2 diabetes mellitus without complications: Secondary | ICD-10-CM | POA: Diagnosis not present

## 2019-10-16 DIAGNOSIS — F17200 Nicotine dependence, unspecified, uncomplicated: Secondary | ICD-10-CM | POA: Diagnosis not present

## 2019-10-16 DIAGNOSIS — E119 Type 2 diabetes mellitus without complications: Secondary | ICD-10-CM | POA: Diagnosis not present

## 2019-10-16 DIAGNOSIS — Z23 Encounter for immunization: Secondary | ICD-10-CM | POA: Diagnosis not present

## 2019-11-03 DIAGNOSIS — F4310 Post-traumatic stress disorder, unspecified: Secondary | ICD-10-CM | POA: Diagnosis not present

## 2019-11-03 DIAGNOSIS — F411 Generalized anxiety disorder: Secondary | ICD-10-CM | POA: Diagnosis not present

## 2019-11-03 DIAGNOSIS — F250 Schizoaffective disorder, bipolar type: Secondary | ICD-10-CM | POA: Diagnosis not present

## 2019-11-04 DIAGNOSIS — I10 Essential (primary) hypertension: Secondary | ICD-10-CM | POA: Diagnosis not present

## 2019-11-04 DIAGNOSIS — F250 Schizoaffective disorder, bipolar type: Secondary | ICD-10-CM | POA: Diagnosis not present

## 2019-12-23 DIAGNOSIS — E119 Type 2 diabetes mellitus without complications: Secondary | ICD-10-CM | POA: Diagnosis not present

## 2019-12-23 DIAGNOSIS — F250 Schizoaffective disorder, bipolar type: Secondary | ICD-10-CM | POA: Diagnosis not present

## 2019-12-23 DIAGNOSIS — I10 Essential (primary) hypertension: Secondary | ICD-10-CM | POA: Diagnosis not present

## 2020-01-02 DIAGNOSIS — E1140 Type 2 diabetes mellitus with diabetic neuropathy, unspecified: Secondary | ICD-10-CM | POA: Diagnosis not present

## 2020-01-02 DIAGNOSIS — R7309 Other abnormal glucose: Secondary | ICD-10-CM | POA: Diagnosis not present

## 2020-01-02 DIAGNOSIS — E119 Type 2 diabetes mellitus without complications: Secondary | ICD-10-CM | POA: Diagnosis not present

## 2020-01-02 DIAGNOSIS — Z1211 Encounter for screening for malignant neoplasm of colon: Secondary | ICD-10-CM | POA: Diagnosis not present

## 2020-01-28 DIAGNOSIS — F250 Schizoaffective disorder, bipolar type: Secondary | ICD-10-CM | POA: Diagnosis not present

## 2020-01-28 DIAGNOSIS — F4310 Post-traumatic stress disorder, unspecified: Secondary | ICD-10-CM | POA: Diagnosis not present

## 2020-01-28 DIAGNOSIS — F411 Generalized anxiety disorder: Secondary | ICD-10-CM | POA: Diagnosis not present

## 2020-01-28 DIAGNOSIS — E119 Type 2 diabetes mellitus without complications: Secondary | ICD-10-CM | POA: Diagnosis not present

## 2020-01-29 DIAGNOSIS — Z1211 Encounter for screening for malignant neoplasm of colon: Secondary | ICD-10-CM | POA: Diagnosis not present

## 2020-02-13 ENCOUNTER — Telehealth: Payer: PPO

## 2020-02-19 ENCOUNTER — Telehealth (INDEPENDENT_AMBULATORY_CARE_PROVIDER_SITE_OTHER): Payer: Self-pay | Admitting: Gastroenterology

## 2020-02-19 ENCOUNTER — Other Ambulatory Visit: Payer: Self-pay

## 2020-02-19 ENCOUNTER — Telehealth: Payer: Self-pay

## 2020-02-19 DIAGNOSIS — Z1211 Encounter for screening for malignant neoplasm of colon: Secondary | ICD-10-CM

## 2020-02-19 NOTE — Progress Notes (Signed)
Gastroenterology Pre-Procedure Review  Request Date: 03/16/20 Requesting Physician: Dr. Maximino Greenland  PATIENT REVIEW QUESTIONS: The patient responded to the following health history questions as indicated:    1. Are you having any GI issues? no 2. Do you have a personal history of Polyps? no 3. Do you have a family history of Colon Cancer or Polyps? no 4. Diabetes Mellitus? no 5. Joint replacements in the past 12 months?no 6. Major health problems in the past 3 months?no 7. Any artificial heart valves, MVP, or defibrillator?no    MEDICATIONS & ALLERGIES:    Patient reports the following regarding taking any anticoagulation/antiplatelet therapy:   Plavix, Coumadin, Eliquis, Xarelto, Lovenox, Pradaxa, Brilinta, or Effient? no Aspirin? no  Patient confirms/reports the following medications:  Current Outpatient Medications  Medication Sig Dispense Refill  . amLODipine (NORVASC) 10 MG tablet Take 1 tablet (10 mg total) by mouth daily. 30 tablet 0  . atorvastatin (LIPITOR) 20 MG tablet Take 20 mg by mouth at bedtime.    . citalopram (CELEXA) 40 MG tablet Take 1 tablet (40 mg total) by mouth daily. 30 tablet 0  . clonazePAM (KLONOPIN) 0.5 MG tablet Take 1 tablet (0.5 mg total) by mouth 3 (three) times daily as needed for anxiety. 45 tablet 0  . gabapentin (NEURONTIN) 400 MG capsule Take 2 capsules (800 mg total) by mouth 3 (three) times daily. 180 capsule 0  . hydrOXYzine (ATARAX/VISTARIL) 25 MG tablet Take 25 mg by mouth 3 (three) times daily.    . insulin detemir (LEVEMIR) 100 UNIT/ML injection Inject 0.35 mLs (35 Units total) into the skin 2 (two) times daily. 21 mL 0  . lithium carbonate (ESKALITH) 450 MG CR tablet Take 450 mg by mouth 2 (two) times daily.  0  . lithium carbonate (LITHOBID) 300 MG CR tablet Take 2 tablets (600 mg total) by mouth every 12 (twelve) hours. 120 tablet 0  . metFORMIN (GLUCOPHAGE) 1000 MG tablet Take 1 tablet (1,000 mg total) by mouth 2 (two) times daily with a  meal. 60 tablet 0  . methocarbamol (ROBAXIN) 750 MG tablet Take 1 tablet (750 mg total) by mouth every 8 (eight) hours as needed for muscle spasms. 45 tablet 0  . paliperidone (INVEGA) 6 MG 24 hr tablet Take 2 tablets (12 mg total) by mouth at bedtime. 60 tablet 0  . paliperidone (INVEGA) 6 MG 24 hr tablet Take 2 tablets (12 mg total) by mouth every evening. 60 tablet 0  . risperiDONE (RISPERDAL) 2 MG tablet Take 2 mg by mouth at bedtime.  0  . traMADol (ULTRAM) 50 MG tablet Take 1 tablet (50 mg total) by mouth every 6 (six) hours as needed for severe pain. 30 tablet 0  . traZODone (DESYREL) 150 MG tablet Take 1 tablet (150 mg total) by mouth at bedtime. 30 tablet 0  . trihexyphenidyl (ARTANE) 5 MG tablet Take by mouth.     No current facility-administered medications for this visit.    Patient confirms/reports the following allergies:  Allergies  Allergen Reactions  . Divalproex Sodium     REACTION: unspecified reaction    No orders of the defined types were placed in this encounter.   AUTHORIZATION INFORMATION Primary Insurance: 1D#: Group #:  Secondary Insurance: 1D#: Group #:  SCHEDULE INFORMATION: Date: Tuesday 03/16/20 Time: Location:[facility]

## 2020-02-19 NOTE — Telephone Encounter (Signed)
Gastroenterology Pre-Procedure Review  Request Date: 03/16/20 Requesting Physician: Dr. Maximino Greenland  PATIENT REVIEW QUESTIONS: The patient responded to the following health history questions as indicated:    1. Are you having any GI issues? no 2. Do you have a personal history of Polyps? no 3. Do you have a family history of Colon Cancer or Polyps? yes (Pt stated Dad had prostate polyps) 4. Diabetes Mellitus? yes (Pt is diabetic takes insulin and oral meds.) 5. Joint replacements in the past 12 months?no 6. Major health problems in the past 3 months?no 7. Any artificial heart valves, MVP, or defibrillator?no    MEDICATIONS & ALLERGIES:    Patient reports the following regarding taking any anticoagulation/antiplatelet therapy:   Plavix, Coumadin, Eliquis, Xarelto, Lovenox, Pradaxa, Brilinta, or Effient? no Aspirin? no  Patient confirms/reports the following medications:  Current Outpatient Medications  Medication Sig Dispense Refill  . amLODipine (NORVASC) 10 MG tablet Take 1 tablet (10 mg total) by mouth daily. 30 tablet 0  . atorvastatin (LIPITOR) 20 MG tablet Take 20 mg by mouth at bedtime.    . citalopram (CELEXA) 40 MG tablet Take 1 tablet (40 mg total) by mouth daily. 30 tablet 0  . clonazePAM (KLONOPIN) 0.5 MG tablet Take 1 tablet (0.5 mg total) by mouth 3 (three) times daily as needed for anxiety. 45 tablet 0  . gabapentin (NEURONTIN) 400 MG capsule Take 2 capsules (800 mg total) by mouth 3 (three) times daily. 180 capsule 0  . hydrOXYzine (ATARAX/VISTARIL) 25 MG tablet Take 25 mg by mouth 3 (three) times daily.    . insulin detemir (LEVEMIR) 100 UNIT/ML injection Inject 0.35 mLs (35 Units total) into the skin 2 (two) times daily. 21 mL 0  . lithium carbonate (ESKALITH) 450 MG CR tablet Take 450 mg by mouth 2 (two) times daily.  0  . lithium carbonate (LITHOBID) 300 MG CR tablet Take 2 tablets (600 mg total) by mouth every 12 (twelve) hours. 120 tablet 0  . metFORMIN (GLUCOPHAGE)  1000 MG tablet Take 1 tablet (1,000 mg total) by mouth 2 (two) times daily with a meal. 60 tablet 0  . methocarbamol (ROBAXIN) 750 MG tablet Take 1 tablet (750 mg total) by mouth every 8 (eight) hours as needed for muscle spasms. 45 tablet 0  . paliperidone (INVEGA) 6 MG 24 hr tablet Take 2 tablets (12 mg total) by mouth at bedtime. 60 tablet 0  . paliperidone (INVEGA) 6 MG 24 hr tablet Take 2 tablets (12 mg total) by mouth every evening. 60 tablet 0  . risperiDONE (RISPERDAL) 2 MG tablet Take 2 mg by mouth at bedtime.  0  . traMADol (ULTRAM) 50 MG tablet Take 1 tablet (50 mg total) by mouth every 6 (six) hours as needed for severe pain. 30 tablet 0  . traZODone (DESYREL) 150 MG tablet Take 1 tablet (150 mg total) by mouth at bedtime. 30 tablet 0  . trihexyphenidyl (ARTANE) 5 MG tablet Take by mouth.     No current facility-administered medications for this visit.    Patient confirms/reports the following allergies:  Allergies  Allergen Reactions  . Divalproex Sodium     REACTION: unspecified reaction    No orders of the defined types were placed in this encounter.   AUTHORIZATION INFORMATION Primary Insurance: 1D#: Group #:  Secondary Insurance: 1D#: Group #:  SCHEDULE INFORMATION: Date: 03/16/20 Time: Location:[facility]

## 2020-03-08 DIAGNOSIS — I10 Essential (primary) hypertension: Secondary | ICD-10-CM | POA: Diagnosis not present

## 2020-03-08 DIAGNOSIS — E119 Type 2 diabetes mellitus without complications: Secondary | ICD-10-CM | POA: Diagnosis not present

## 2020-03-12 ENCOUNTER — Other Ambulatory Visit: Payer: Self-pay

## 2020-03-12 ENCOUNTER — Other Ambulatory Visit
Admission: RE | Admit: 2020-03-12 | Discharge: 2020-03-12 | Disposition: A | Discharge status: Home / Self Care | Payer: PPO | Source: Ambulatory Visit | Attending: Gastroenterology | Admitting: Gastroenterology

## 2020-03-12 DIAGNOSIS — Z01812 Encounter for preprocedural laboratory examination: Secondary | ICD-10-CM | POA: Diagnosis not present

## 2020-03-12 DIAGNOSIS — Z20822 Contact with and (suspected) exposure to covid-19: Secondary | ICD-10-CM | POA: Diagnosis not present

## 2020-03-13 LAB — SARS CORONAVIRUS 2 (TAT 6-24 HRS): SARS Coronavirus 2: NEGATIVE

## 2020-03-15 ENCOUNTER — Other Ambulatory Visit: Payer: Self-pay

## 2020-03-15 DIAGNOSIS — Z1211 Encounter for screening for malignant neoplasm of colon: Secondary | ICD-10-CM

## 2020-03-16 ENCOUNTER — Ambulatory Visit: Admission: RE | Admit: 2020-03-16 | Payer: PPO | Source: Home / Self Care | Admitting: Gastroenterology

## 2020-03-16 ENCOUNTER — Encounter: Admission: RE | Payer: Self-pay | Source: Home / Self Care

## 2020-03-16 SURGERY — COLONOSCOPY WITH PROPOFOL
Anesthesia: General

## 2020-03-17 ENCOUNTER — Other Ambulatory Visit
Admission: RE | Admit: 2020-03-17 | Discharge: 2020-03-17 | Disposition: A | Discharge status: Home / Self Care | Payer: PPO | Source: Ambulatory Visit | Attending: Gastroenterology | Admitting: Gastroenterology

## 2020-03-17 ENCOUNTER — Other Ambulatory Visit: Payer: Self-pay

## 2020-03-17 DIAGNOSIS — Z20822 Contact with and (suspected) exposure to covid-19: Secondary | ICD-10-CM | POA: Diagnosis not present

## 2020-03-17 DIAGNOSIS — Z01812 Encounter for preprocedural laboratory examination: Secondary | ICD-10-CM | POA: Insufficient documentation

## 2020-03-17 LAB — SARS CORONAVIRUS 2 (TAT 6-24 HRS): SARS Coronavirus 2: NEGATIVE

## 2020-03-19 ENCOUNTER — Encounter
Admission: RE | Disposition: A | Discharge status: Home / Self Care | Payer: Self-pay | Source: Home / Self Care | Attending: Gastroenterology

## 2020-03-19 ENCOUNTER — Encounter: Payer: Self-pay | Admitting: Certified Registered Nurse Anesthetist

## 2020-03-19 ENCOUNTER — Ambulatory Visit
Admission: RE | Admit: 2020-03-19 | Discharge: 2020-03-19 | Disposition: A | Discharge status: Home / Self Care | Payer: PPO | Attending: Gastroenterology | Admitting: Gastroenterology

## 2020-03-19 ENCOUNTER — Other Ambulatory Visit: Payer: Self-pay

## 2020-03-19 ENCOUNTER — Encounter: Payer: Self-pay | Admitting: Gastroenterology

## 2020-03-19 DIAGNOSIS — Z5309 Procedure and treatment not carried out because of other contraindication: Secondary | ICD-10-CM | POA: Diagnosis not present

## 2020-03-19 DIAGNOSIS — Z1211 Encounter for screening for malignant neoplasm of colon: Secondary | ICD-10-CM | POA: Diagnosis not present

## 2020-03-19 SURGERY — COLONOSCOPY WITH PROPOFOL
Anesthesia: General

## 2020-03-19 MED ORDER — SODIUM CHLORIDE 0.9 % IV SOLN: INTRAVENOUS | Status: DC

## 2020-03-19 MED ORDER — LIDOCAINE HCL (PF) 2 % IJ SOLN
INTRAMUSCULAR | Status: AC
  Filled 2020-03-19: qty 5

## 2020-03-19 MED ORDER — PROPOFOL 500 MG/50ML IV EMUL
INTRAVENOUS | Status: AC
  Filled 2020-03-19: qty 50

## 2020-03-19 NOTE — Progress Notes (Signed)
[name] stated that he "ate two chicken legs and a fruit pie yesterday evening" because he "was starving." Also states that output from prep is watery but not clear. This was discussed with Dr. Maximino Greenland. [name] will reschedule his colonoscopy.

## 2020-03-24 ENCOUNTER — Telehealth: Payer: Self-pay

## 2020-03-24 NOTE — Telephone Encounter (Signed)
Called patient to reschedule his colonoscopy and he stated that he was not rescheduling it at this time and that he would call us back whenever he had the money to pay for his prep. Patient stated that he was on disability and have no money until after September 15 th. I told him that we could go ahead and schedule it after the 15 th, however, he declined and stated that he would call us back whenever he wanted to reschedule. I mad sure he had our phone number to call us back. Patient had no questions.

## 2020-03-24 NOTE — Telephone Encounter (Signed)
error 

## 2020-03-24 NOTE — Telephone Encounter (Signed)
-----   Message from Pasty Spillers, MD sent at 03/19/2020  9:37 AM EDT ----- Pt ate solid food before his colonoscopy and it was cancelled. Reschedule for next week please.

## 2020-04-07 DIAGNOSIS — E119 Type 2 diabetes mellitus without complications: Secondary | ICD-10-CM | POA: Diagnosis not present

## 2020-04-07 DIAGNOSIS — F250 Schizoaffective disorder, bipolar type: Secondary | ICD-10-CM | POA: Diagnosis not present

## 2020-04-22 DIAGNOSIS — F411 Generalized anxiety disorder: Secondary | ICD-10-CM | POA: Diagnosis not present

## 2020-04-22 DIAGNOSIS — F4310 Post-traumatic stress disorder, unspecified: Secondary | ICD-10-CM | POA: Diagnosis not present

## 2020-04-22 DIAGNOSIS — F250 Schizoaffective disorder, bipolar type: Secondary | ICD-10-CM | POA: Diagnosis not present

## 2020-05-25 DIAGNOSIS — I10 Essential (primary) hypertension: Secondary | ICD-10-CM | POA: Diagnosis not present

## 2020-05-25 DIAGNOSIS — F17200 Nicotine dependence, unspecified, uncomplicated: Secondary | ICD-10-CM | POA: Diagnosis not present

## 2020-06-28 DIAGNOSIS — I10 Essential (primary) hypertension: Secondary | ICD-10-CM | POA: Diagnosis not present

## 2020-06-28 DIAGNOSIS — F17200 Nicotine dependence, unspecified, uncomplicated: Secondary | ICD-10-CM | POA: Diagnosis not present

## 2020-07-06 DIAGNOSIS — E119 Type 2 diabetes mellitus without complications: Secondary | ICD-10-CM | POA: Diagnosis not present

## 2020-07-06 DIAGNOSIS — I10 Essential (primary) hypertension: Secondary | ICD-10-CM | POA: Diagnosis not present

## 2020-07-13 DIAGNOSIS — F4310 Post-traumatic stress disorder, unspecified: Secondary | ICD-10-CM | POA: Diagnosis not present

## 2020-07-13 DIAGNOSIS — F411 Generalized anxiety disorder: Secondary | ICD-10-CM | POA: Diagnosis not present

## 2020-07-13 DIAGNOSIS — F250 Schizoaffective disorder, bipolar type: Secondary | ICD-10-CM | POA: Diagnosis not present

## 2020-12-15 ENCOUNTER — Telehealth: Payer: Self-pay | Admitting: Gastroenterology

## 2020-12-15 NOTE — Telephone Encounter (Signed)
Patient is ready to schedule his colonoscopy with Dr. Maximino Greenland.

## 2020-12-21 ENCOUNTER — Other Ambulatory Visit: Payer: Self-pay

## 2020-12-21 DIAGNOSIS — Z1211 Encounter for screening for malignant neoplasm of colon: Secondary | ICD-10-CM

## 2020-12-21 NOTE — Telephone Encounter (Signed)
Patient was contacted and he was able to schedule his colonoscopy for 12/29/2020 at [facility] with Dr. Maximino Greenland.

## 2020-12-29 ENCOUNTER — Ambulatory Visit: Payer: Medicare HMO | Admitting: Certified Registered"

## 2020-12-29 ENCOUNTER — Ambulatory Visit
Admission: RE | Admit: 2020-12-29 | Discharge: 2020-12-29 | Disposition: A | Discharge status: Home / Self Care | Payer: Medicare HMO | Attending: Gastroenterology | Admitting: Gastroenterology

## 2020-12-29 ENCOUNTER — Encounter
Admission: RE | Disposition: A | Discharge status: Home / Self Care | Payer: Self-pay | Source: Home / Self Care | Attending: Gastroenterology

## 2020-12-29 DIAGNOSIS — K5730 Diverticulosis of large intestine without perforation or abscess without bleeding: Secondary | ICD-10-CM | POA: Insufficient documentation

## 2020-12-29 DIAGNOSIS — Z1211 Encounter for screening for malignant neoplasm of colon: Secondary | ICD-10-CM

## 2020-12-29 DIAGNOSIS — F17210 Nicotine dependence, cigarettes, uncomplicated: Secondary | ICD-10-CM | POA: Insufficient documentation

## 2020-12-29 DIAGNOSIS — Z7984 Long term (current) use of oral hypoglycemic drugs: Secondary | ICD-10-CM | POA: Diagnosis not present

## 2020-12-29 DIAGNOSIS — K648 Other hemorrhoids: Secondary | ICD-10-CM | POA: Insufficient documentation

## 2020-12-29 DIAGNOSIS — Z794 Long term (current) use of insulin: Secondary | ICD-10-CM | POA: Insufficient documentation

## 2020-12-29 DIAGNOSIS — Z79899 Other long term (current) drug therapy: Secondary | ICD-10-CM | POA: Insufficient documentation

## 2020-12-29 DIAGNOSIS — Z888 Allergy status to other drugs, medicaments and biological substances status: Secondary | ICD-10-CM | POA: Insufficient documentation

## 2020-12-29 HISTORY — PX: COLONOSCOPY WITH PROPOFOL: SHX5780

## 2020-12-29 LAB — GLUCOSE, CAPILLARY: Glucose-Capillary: 121 mg/dL — ABNORMAL HIGH (ref 70–99)

## 2020-12-29 SURGERY — COLONOSCOPY WITH PROPOFOL
Anesthesia: General

## 2020-12-29 MED ORDER — SODIUM CHLORIDE 0.9 % IV SOLN
INTRAVENOUS | Status: DC
  Administered 2020-12-29: 20 mL/h via INTRAVENOUS

## 2020-12-29 MED ORDER — PROPOFOL 10 MG/ML IV BOLUS
INTRAVENOUS | Status: DC | PRN
  Administered 2020-12-29 (×2): 50 mg via INTRAVENOUS

## 2020-12-29 MED ORDER — PROPOFOL 500 MG/50ML IV EMUL
INTRAVENOUS | Status: DC | PRN
  Administered 2020-12-29: 160 ug/kg/min via INTRAVENOUS

## 2020-12-29 MED ORDER — PROPOFOL 10 MG/ML IV BOLUS
INTRAVENOUS | Status: AC
  Filled 2020-12-29: qty 20

## 2020-12-29 MED ORDER — PROPOFOL 500 MG/50ML IV EMUL
INTRAVENOUS | Status: AC
  Filled 2020-12-29: qty 50

## 2020-12-29 MED ORDER — DEXMEDETOMIDINE (PRECEDEX) IN NS 20 MCG/5ML (4 MCG/ML) IV SYRINGE
PREFILLED_SYRINGE | INTRAVENOUS | Status: DC | PRN
  Administered 2020-12-29: 12 ug via INTRAVENOUS

## 2020-12-29 NOTE — Op Note (Signed)
[facility] Gastroenterology Patient Name: Maxwell HarvestDaniel Hamilton Procedure Date: 12/29/2020 10:06 AM MRN: [id number] Account #: [id number] Date of Birth: 1966/06/08 Admit Type: Outpatient Age: 55 [facility] Gender: Male Note Status: Finalized Procedure:             Colonoscopy Indications:           Screening for colorectal malignant neoplasm Providers:             Lynnelle Mesmer B. Maximino Greenlandahiliani MD, MD Medicines:             Monitored Anesthesia Care Complications:         No immediate complications. Procedure:             Pre-Anesthesia Assessment:                        - Prior to the procedure, a History and Physical was                         performed, and patient medications, allergies and                         sensitivities were reviewed. The patient's tolerance                         of previous anesthesia was reviewed.                        - The risks and benefits of the procedure and the                         sedation options and risks were discussed with the                         patient. All questions were answered and informed                         consent was obtained.                        - Patient identification and proposed procedure were                         verified prior to the procedure by the physician, the                         nurse, the anesthetist and the technician. The                         procedure was verified in the pre-procedure area in                         the procedure room in the endoscopy suite.                        - ASA Grade Assessment: II - A patient with mild                         systemic disease.                        -  After reviewing the risks and benefits, the patient                         was deemed in satisfactory condition to undergo the                         procedure.                        After obtaining informed consent, the colonoscope was                         passed under direct  vision. Throughout the procedure,                         the patient's blood pressure, pulse, and oxygen                         saturations were monitored continuously. The                         Colonoscope was introduced through the anus and                         advanced to the the cecum, identified by appendiceal                         orifice and ileocecal valve. The colonoscopy was                         performed with ease. The patient tolerated the                         procedure well. The quality of the bowel preparation                         was good. Findings:      The perianal and digital rectal examinations were normal.      Multiple diverticula were found in the entire colon.      The exam was otherwise without abnormality.      The rectum, sigmoid colon, descending colon, transverse colon, ascending       colon and cecum appeared normal.      Non-bleeding internal hemorrhoids were found during retroflexion.      No additional abnormalities were found on retroflexion. Impression:            - Diverticulosis in the entire examined colon.                        - The examination was otherwise normal.                        - The rectum, sigmoid colon, descending colon,                         transverse colon, ascending colon and cecum are normal.                        - Non-bleeding internal hemorrhoids.                        -  No specimens collected. Recommendation:        - Discharge patient to home.                        - Resume previous diet.                        - Continue present medications.                        - Repeat colonoscopy in 10 years for screening                         purposes.                        - Return to primary care physician as previously                         scheduled.                        - The findings and recommendations were discussed with                         the patient.                        - The findings  and recommendations were discussed with                         the patient's family.                        - High fiber diet.                        - In the future, if patient develops new symptoms such                         as blood per rectum, abdominal pain, weight loss,                         altered bowel habits or any other reason for concern,                         patient should discuss this with thier PCP as they may                         need a GI referral at that time or evaluation for need                         for colonoscopy earlier than the recommended screening                         colonoscopy.                        In addition, if patient's family history of colon  cancer changes (no family history at this time) in the                         future, earlier screening may be indicated and patient                         should discuss this with PCP as well. Procedure Code(s):     --- Professional ---                        [phone], Colonoscopy, flexible; diagnostic, including                         collection of specimen(s) by brushing or washing, when                         performed (separate procedure) Diagnosis Code(s):     --- Professional ---                        Z12.11, Encounter for screening for malignant neoplasm                         of colon CPT copyright 2019 American Medical Association. All rights reserved. The codes documented in this report are preliminary and upon coder review may  be revised to meet current compliance requirements.  Melodie Bouillon, MD Michel Bickers B. Maximino Greenland MD, MD 12/29/2020 10:52:13 AM This report has been signed electronically. Number of Addenda: 0 Note Initiated On: 12/29/2020 10:06 AM Scope Withdrawal Time: 0 hours 18 minutes 10 seconds  Total Procedure Duration: 0 hours 22 minutes 14 seconds       [facility]

## 2020-12-29 NOTE — Anesthesia Postprocedure Evaluation (Signed)
Anesthesia Post Note  Patient: [name]  Procedure(s) Performed: COLONOSCOPY WITH PROPOFOL (N/A )  Patient location during evaluation: PACU Anesthesia Type: General Level of consciousness: awake and alert Pain management: pain level controlled Vital Signs Assessment: post-procedure vital signs reviewed and stable Respiratory status: spontaneous breathing and respiratory function stable Cardiovascular status: stable Anesthetic complications: no   No complications documented.   Last Vitals:  Vitals:   12/29/20 1049 12/29/20 1059  BP: 116/65   Pulse: (!) 59 (!) 53  Resp: 17 16  Temp: (!) 36.3 C   SpO2: 93% 92%    Last Pain:  Vitals:   12/29/20 1049  TempSrc: Temporal  PainSc: Asleep                 Cadie Sorci K

## 2020-12-29 NOTE — H&P (Signed)
Melodie Bouillon, MD [address], [place], [place], [address] [address], [address], [place], [place], [address] Phone: [phone]  Fax: [phone]  Primary Care Physician:  [name], MD   Pre-Procedure History & Physical: HPI:  [name] is a 55 y.o. male is here for a colonoscopy.   Past Medical History:  Diagnosis Date  . 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 Alcohol abuse   . Bipolar disorder (HCC)    [facility] psych admission 12/2011 for SI  . BRBPR (bright red blood per rectum) 05/30 - 01/01/07   [facility]  . COPD (chronic obstructive pulmonary disease) (HCC)   . Depression   . Diabetes mellitus    Type II  . ED (erectile dysfunction)   . GI bleed 06/15 - 01/20/07   [facility] ,  NSaids, anemia  . Hyperlipidemia   . Hypertension   . Lithium toxicity 7/1 - 02/02/07   [facility]  . OSA (obstructive sleep apnea)   . Overdose    Episodes in the past (2)  . Plantar fasciitis    3 Cortisone shots in heel (Dr. Al Corpus)  . S/P endoscopy 01/16/07   Capsule, bleeding in small bowel  . Schizoaffective disorder     Past Surgical History:  Procedure Laterality Date  . BACK SURGERY  05/28/07   L4-S1 fusing, pins, screws and rods (Dr. Otelia Sergeant)  . DOPPLER ECHOCARDIOGRAPHY  10/20/04   Normal    Prior to Admission medications   Medication Sig Start Date End Date Taking? Authorizing Provider  amLODipine (NORVASC) 10 MG tablet Take 1 tablet (10 mg total) by mouth daily. 01/06/17  Yes Jimmy Footman, MD  atorvastatin (LIPITOR) 20 MG tablet Take 20 mg by mouth at bedtime. 01/05/20  Yes [provider]  citalopram (CELEXA) 40 MG tablet Take 1 tablet (40 mg total) by mouth daily. 01/15/17  Yes Jimmy Footman, MD  gabapentin (NEURONTIN) 400 MG capsule Take 2 capsules (800 mg total) by mouth 3 (three) times daily. 01/15/17  Yes Jimmy Footman, MD  hydrOXYzine (ATARAX/VISTARIL)  25 MG tablet Take 25 mg by mouth 3 (three) times daily. 01/28/20  Yes [provider]  insulin detemir (LEVEMIR) 100 UNIT/ML injection Inject 0.35 mLs (35 Units total) into the skin 2 (two) times daily. 01/15/17  Yes Jimmy Footman, MD  metFORMIN (GLUCOPHAGE) 1000 MG tablet Take 1 tablet (1,000 mg total) by mouth 2 (two) times daily with a meal. 01/05/17  Yes Jimmy Footman, MD  methocarbamol (ROBAXIN) 750 MG tablet Take 1 tablet (750 mg total) by mouth every 8 (eight) hours as needed for muscle spasms. 01/05/17  Yes Jimmy Footman, MD  paliperidone (INVEGA) 6 MG 24 hr tablet Take 2 tablets (12 mg total) by mouth every evening. 03/30/17  Yes Clapacs, Jackquline Denmark, MD  risperiDONE (RISPERDAL) 2 MG tablet Take 2 mg by mouth at bedtime. 10/14/16  Yes [provider]  traMADol (ULTRAM) 50 MG tablet Take 1 tablet (50 mg total) by mouth every 6 (six) hours as needed for severe pain. 01/15/17  Yes Jimmy Footman, MD  traZODone (DESYREL) 150 MG tablet Take 1 tablet (150 mg total) by mouth at bedtime. 01/05/17  Yes Jimmy Footman, MD  trihexyphenidyl (ARTANE) 5 MG tablet Take by mouth. 01/15/20  Yes [provider]  clonazePAM (KLONOPIN) 0.5 MG tablet Take 1 tablet (0.5 mg total) by mouth 3 (three) times daily as needed for anxiety. 01/15/17  Jimmy Footman, MD  lithium carbonate (LITHOBID) 300 MG CR tablet Take 2 tablets (600 mg total) by mouth every 12 (twelve) hours. 03/30/17   Clapacs, Jackquline Denmark, MD    Allergies as of 12/21/2020 - Review Complete 03/19/2020  Allergen Reaction Noted  . Divalproex sodium  12/04/2006  . Papaya derivatives  03/19/2020    Family History  Problem Relation Age of Onset  . Cancer Mother        Breast, bone CA in pelvis and lower back  . Depression Mother        mild paranoid schizophrenic  . Diabetes Maternal Grandfather   . COPD Maternal Grandfather     Social History   Socioeconomic  History  . Marital status: Divorced    Spouse name: Not on file  . Number of children: 1  . Years of education: Not on file  . Highest education level: Not on file  Occupational History  . Occupation: Chartered certified accountant, Environmental health practitioner.    Employer: DISABLED  . Occupation: Disability from back surgery  Tobacco Use  . Smoking status: Current Some Day Smoker    Packs/day: 0.50    Years: 14.00    Pack years: 7.00    Types: Cigarettes  . Smokeless tobacco: Never Used  Substance and Sexual Activity  . Alcohol use: No    Comment: Rarely  . Drug use: No  . Sexual activity: Not on file  Other Topics Concern  . Not on file  Social History Narrative  . Not on file   Social Determinants of Health   Financial Resource Strain: Not on file  Food Insecurity: Not on file  Transportation Needs: Not on file  Physical Activity: Not on file  Stress: Not on file  Social Connections: Not on file  Intimate Partner Violence: Not on file    Review of Systems: See HPI, otherwise negative ROS  Physical Exam: BP 134/82   Pulse 69   Temp (!) 97.4 F (36.3 C) (Temporal)   Resp 20   Ht 5\' 9"  (1.753 m)   Wt 113.4 kg   SpO2 98%   BMI 36.92 kg/m  General:   Alert,  pleasant and cooperative in NAD Head:  Normocephalic and atraumatic. Neck:  Supple; no masses or thyromegaly. Lungs:  Clear throughout to auscultation, normal respiratory effort.    Heart:  +S1, +S2, Regular rate and rhythm, No edema. Abdomen:  Soft, nontender and nondistended. Normal bowel sounds, without guarding, and without rebound.   Neurologic:  Alert and  oriented x4;  grossly normal neurologically.  Impression/Plan: [name] is here for a colonoscopy to be performed for average risk screening.  Risks, benefits, limitations, and alternatives regarding  colonoscopy have been reviewed with the patient.  Questions have been answered.  All parties agreeable.   [name], MD  12/29/2020, 9:10 AM

## 2020-12-29 NOTE — Transfer of Care (Signed)
Immediate Anesthesia Transfer of Care Note  Patient: [name]  Procedure(s) Performed: COLONOSCOPY WITH PROPOFOL (N/A )  Patient Location: PACU and Endoscopy Unit  Anesthesia Type:General  Level of Consciousness: drowsy  Airway & Oxygen Therapy: Patient Spontanous Breathing  Post-op Assessment: Report given to RN  Post vital signs: stable  Last Vitals:  Vitals Value Taken Time  BP    Temp    Pulse    Resp    SpO2      Last Pain:  Vitals:   12/29/20 0859  TempSrc: Temporal  PainSc: 0-No pain         Complications: No complications documented.

## 2020-12-29 NOTE — Anesthesia Preprocedure Evaluation (Signed)
Anesthesia Evaluation  Patient identified by MRN, date of birth, ID band Patient awake    Reviewed: Allergy & Precautions, NPO status , Patient's Chart, lab work & pertinent test results  History of Anesthesia Complications Negative for: history of anesthetic complications  Airway Mallampati: II       Dental  (+) Upper Dentures, Poor Dentition, Chipped, Loose, Missing   Pulmonary sleep apnea (not using CPAP) , COPD,  COPD inhaler, Current Smoker and Patient abstained from smoking.,           Cardiovascular hypertension, Pt. on medications (-) Past MI and (-) CHF (-) dysrhythmias (-) Valvular Problems/Murmurs     Neuro/Psych neg Seizures Depression Bipolar Disorder Schizophrenia    GI/Hepatic Neg liver ROS, neg GERD  ,  Endo/Other  diabetes, Type 2, Oral Hypoglycemic Agents  Renal/GU negative Renal ROS     Musculoskeletal   Abdominal   Peds  Hematology   Anesthesia Other Findings   Reproductive/Obstetrics                             Anesthesia Physical Anesthesia Plan  ASA: III  Anesthesia Plan: General   Post-op Pain Management:    Induction: Intravenous  PONV Risk Score and Plan: 1 and Propofol infusion and TIVA  Airway Management Planned: Nasal Cannula  Additional Equipment:   Intra-op Plan:   Post-operative Plan:   Informed Consent: I have reviewed the patients History and Physical, chart, labs and discussed the procedure including the risks, benefits and alternatives for the proposed anesthesia with the patient or authorized representative who has indicated his/her understanding and acceptance.       Plan Discussed with:   Anesthesia Plan Comments:         Anesthesia Quick Evaluation

## 2020-12-30 ENCOUNTER — Encounter: Payer: Self-pay | Admitting: Gastroenterology

## 2021-02-13 ENCOUNTER — Encounter: Payer: Self-pay | Admitting: Emergency Medicine

## 2021-02-13 ENCOUNTER — Other Ambulatory Visit: Payer: Self-pay

## 2021-02-13 ENCOUNTER — Emergency Department
Admission: EM | Admit: 2021-02-13 | Discharge: 2021-02-13 | Disposition: A | Discharge status: Home / Self Care | Payer: Medicare HMO | Attending: Emergency Medicine | Admitting: Emergency Medicine

## 2021-02-13 ENCOUNTER — Emergency Department: Payer: Medicare HMO

## 2021-02-13 DIAGNOSIS — Z79899 Other long term (current) drug therapy: Secondary | ICD-10-CM | POA: Diagnosis not present

## 2021-02-13 DIAGNOSIS — Z794 Long term (current) use of insulin: Secondary | ICD-10-CM | POA: Diagnosis not present

## 2021-02-13 DIAGNOSIS — J449 Chronic obstructive pulmonary disease, unspecified: Secondary | ICD-10-CM | POA: Insufficient documentation

## 2021-02-13 DIAGNOSIS — M549 Dorsalgia, unspecified: Secondary | ICD-10-CM | POA: Diagnosis present

## 2021-02-13 DIAGNOSIS — E119 Type 2 diabetes mellitus without complications: Secondary | ICD-10-CM | POA: Diagnosis not present

## 2021-02-13 DIAGNOSIS — I10 Essential (primary) hypertension: Secondary | ICD-10-CM | POA: Diagnosis not present

## 2021-02-13 DIAGNOSIS — M545 Low back pain, unspecified: Secondary | ICD-10-CM

## 2021-02-13 DIAGNOSIS — F17210 Nicotine dependence, cigarettes, uncomplicated: Secondary | ICD-10-CM | POA: Diagnosis not present

## 2021-02-13 DIAGNOSIS — M4316 Spondylolisthesis, lumbar region: Secondary | ICD-10-CM | POA: Diagnosis not present

## 2021-02-13 DIAGNOSIS — M4317 Spondylolisthesis, lumbosacral region: Secondary | ICD-10-CM

## 2021-02-13 DIAGNOSIS — G8929 Other chronic pain: Secondary | ICD-10-CM

## 2021-02-13 LAB — URINALYSIS, COMPLETE (UACMP) WITH MICROSCOPIC
Bacteria, UA: NONE SEEN
Bilirubin Urine: NEGATIVE
Glucose, UA: NEGATIVE mg/dL
Ketones, ur: NEGATIVE mg/dL
Leukocytes,Ua: NEGATIVE
Nitrite: NEGATIVE
Protein, ur: NEGATIVE mg/dL
Specific Gravity, Urine: 1.006 (ref 1.005–1.030)
Squamous Epithelial / HPF: NONE SEEN (ref 0–5)
pH: 6 (ref 5.0–8.0)

## 2021-02-13 MED ORDER — PREDNISONE 20 MG PO TABS: 40.0000 mg | ORAL_TABLET | Freq: Every day | ORAL | 0 refills | Status: AC

## 2021-02-13 MED ORDER — KETOROLAC TROMETHAMINE 30 MG/ML IJ SOLN
30.0000 mg | Freq: Once | INTRAMUSCULAR | Status: AC
  Administered 2021-02-13: 30 mg via INTRAMUSCULAR
  Filled 2021-02-13: qty 1

## 2021-02-13 MED ORDER — BACLOFEN 10 MG PO TABS: 10.0000 mg | ORAL_TABLET | Freq: Every day | ORAL | 1 refills | Status: AC

## 2021-02-13 MED ORDER — ORPHENADRINE CITRATE 30 MG/ML IJ SOLN
60.0000 mg | INTRAMUSCULAR | Status: AC
  Administered 2021-02-13: 60 mg via INTRAMUSCULAR
  Filled 2021-02-13: qty 2

## 2021-02-13 NOTE — Discharge Instructions (Addendum)
Your exam and CT overall reassuring without any signs of any acute or new findings.  Take the prescription medications as provided.  Follow-up with Ortho for ongoing symptoms.  Return to the ED if needed.

## 2021-02-13 NOTE — ED Triage Notes (Signed)
Pt comes into the ED via [facility] from home c/o intermittent back pain that radiates from his neck down to his previous back surgery incision.  Pt denies any known injury recently.  Pt states he was having some pain before bed, but woke up this morning it was much worse.  Pt denies any problems with urination.

## 2021-02-13 NOTE — ED Provider Notes (Signed)
[facility] Emergency Department Provider Note  ____________________________________________   Event Date/Time   First MD Initiated Contact with Patient 02/13/21 1124     (approximate)  I have reviewed the triage vital signs and the nursing notes.   HISTORY  Chief Complaint Back Pain  HPI [name] is a 55 y.o. male with the below medical history including diabetes, hypertension, schizoaffective disorder, and bipolar disorder, presents to the ED for evaluation of intermittent back pain.  Patient describes pain that originates in the neck, and radiates down to her previous back surgery incision.  Patient reports some pain prior to going to bed last night, and awoke with worsening pain.  Denies any preceding injury, trauma, or fall.  He also denies any bladder or bowel incontinence, foot drop, saddle anesthesia.  She has not had any need for interim evaluation with a neurologist or orthopedic spine specialist since his surgery.    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 Al Corpus)   S/P endoscopy 01/16/07   Capsule, bleeding in small bowel   Schizoaffective disorder     Patient Active Problem List   Diagnosis Date Noted   Encounter for screening colonoscopy    Adjustment disorder with mixed disturbance of emotions and conduct 01/18/2017    Hyperlipemia 01/01/2017   COPD (chronic obstructive pulmonary disease) (HCC) 01/01/2017   Chronic back pain 01/01/2017   Schizoaffective disorder, bipolar type (HCC) 12/29/2016   OBSTRUCTIVE SLEEP APNEA 05/13/2007   Diabetes (HCC) 02/13/2007   OBESITY 02/13/2007   Hypertension 02/13/2007    Past Surgical History:  Procedure Laterality Date   BACK SURGERY  05/28/07   L4-S1 fusing, pins, screws and rods (Dr. Otelia Sergeant)   COLONOSCOPY WITH PROPOFOL N/A 12/29/2020   Procedure: COLONOSCOPY WITH PROPOFOL;  Surgeon: Pasty Spillers, MD;  Location: [facility] ENDOSCOPY;  Service: Endoscopy;  Laterality: N/A;   DOPPLER ECHOCARDIOGRAPHY  10/20/04   Normal    Prior to Admission medications   Medication Sig Start Date End Date Taking? Authorizing Provider  baclofen (LIORESAL) 10 MG tablet Take 1 tablet (10 mg total) by mouth daily. 02/13/21 02/13/22 Yes Chelsea Pedretti, Charlesetta Ivory, PA-C  predniSONE (DELTASONE) 20 MG tablet Take 2 tablets (40 mg total) by mouth daily with breakfast for 5 days. 02/13/21 02/18/21 Yes Dorr Perrot, Charlesetta Ivory, PA-C  amLODipine (NORVASC) 10 MG tablet Take 1 tablet (10 mg total) by mouth daily. 01/06/17   Jimmy Footman, MD  atorvastatin (LIPITOR) 20 MG tablet Take 20 mg by mouth at bedtime. 01/05/20   [provider]  citalopram (CELEXA) 40 MG tablet Take 1 tablet (40 mg total) by mouth daily. 01/15/17   Jimmy Footman, MD  clonazePAM (KLONOPIN) 0.5 MG tablet Take 1 tablet (0.5 mg total) by mouth 3 (three) times daily as needed for anxiety. 01/15/17   Jimmy FootmanHernandez-Gonzalez, Andrea, MD  gabapentin (NEURONTIN) 400 MG capsule Take 2 capsules (800 mg total) by mouth 3 (three) times daily. 01/15/17   Jimmy FootmanHernandez-Gonzalez, Andrea, MD  hydrOXYzine (ATARAX/VISTARIL) 25 MG tablet Take 25 mg by mouth 3 (three) times daily. 01/28/20   [provider]  insulin detemir (LEVEMIR) 100 UNIT/ML injection Inject 0.35 mLs (35 Units total) into the skin 2 (two) times daily.  01/15/17   Jimmy FootmanHernandez-Gonzalez, Andrea, MD  lithium carbonate (LITHOBID) 300 MG CR tablet Take 2 tablets (600 mg total) by mouth every 12 (twelve) hours. 03/30/17   Clapacs, Jackquline DenmarkJohn T, MD  metFORMIN (GLUCOPHAGE) 1000 MG tablet Take 1 tablet (1,000 mg total) by mouth 2 (two) times daily with a meal. 01/05/17   Jimmy FootmanHernandez-Gonzalez, Andrea, MD  methocarbamol (ROBAXIN) 750 MG tablet Take 1 tablet (750 mg total) by mouth every 8 (eight) hours as needed for muscle spasms. 01/05/17   Jimmy FootmanHernandez-Gonzalez, Andrea, MD  paliperidone (INVEGA) 6 MG 24 hr tablet Take 2 tablets (12 mg total) by mouth every evening. 03/30/17   Clapacs, Jackquline DenmarkJohn T, MD  risperiDONE (RISPERDAL) 2 MG tablet Take 2 mg by mouth at bedtime. 10/14/16   [provider]  traMADol (ULTRAM) 50 MG tablet Take 1 tablet (50 mg total) by mouth every 6 (six) hours as needed for severe pain. 01/15/17   Jimmy FootmanHernandez-Gonzalez, Andrea, MD  traZODone (DESYREL) 150 MG tablet Take 1 tablet (150 mg total) by mouth at bedtime. 01/05/17   Jimmy FootmanHernandez-Gonzalez, Andrea, MD  trihexyphenidyl (ARTANE) 5 MG tablet Take by mouth. 01/15/20   [provider]    Allergies Divalproex sodium and Papaya derivatives  Family History  Problem Relation Age of Onset   Cancer Mother        Breast, bone CA in pelvis and lower back   Depression Mother        mild paranoid schizophrenic   Diabetes Maternal Grandfather    COPD Maternal Grandfather     Social History Social History   Tobacco Use   Smoking status: Some Days    Packs/day: 0.50    Years: 14.00    Pack years: 7.00    Types: Cigarettes   Smokeless tobacco: Never  Substance Use Topics   Alcohol use: No    Comment: Rarely   Drug use: No    Review of Systems  Constitutional: No fever/chills Eyes: No visual changes. ENT: No sore throat. Cardiovascular: Denies chest pain. Respiratory: Denies shortness of breath. Gastrointestinal: No abdominal pain.  No nausea, no vomiting.  No diarrhea.  No  constipation. Genitourinary: Negative for dysuria. Musculoskeletal: Positive for neck & back pain. Skin: Negative for rash. Neurological: Negative for headaches, focal weakness or numbness. ____________________________________________   PHYSICAL EXAM:  VITAL SIGNS: ED Triage Vitals  Enc Vitals Group     BP 02/13/21 0851 (!) 148/87     Pulse Rate 02/13/21 0851 89     Resp 02/13/21 0851 19     Temp 02/13/21 0851 98.9 F (37.2 C)     Temp Source 02/13/21 0851 Oral     SpO2 02/13/21 0851 95 %     Weight 02/13/21 0849 250 lb (113.4 kg)     Height 02/13/21 0849 5\' 9"  (1.753 m)     Head Circumference --      Peak Flow --      Pain Score 02/13/21 0849 8  Pain Loc --      Pain Edu? --      Excl. in GC? --     Constitutional: Alert and oriented. Well appearing and in no acute distress. Eyes: Conjunctivae are normal. PERRL. EOMI. Head: Atraumatic. Neck: No stridor. No cervical spine tenderness to palpation. Cardiovascular: Normal rate, regular rhythm. Grossly normal heart sounds.  Good peripheral circulation. Respiratory: Normal respiratory effort.  No retractions. Lungs CTAB. Gastrointestinal: Soft and nontender. No distention. No abdominal bruits. No CVA tenderness. Musculoskeletal: Normal spinal alignment without significant midline tenderness, spasm, deformity, or step-off.  Patient with generalized tenderness to the paraspinal musculatures of the lumbar sacral spine.  He transitions from sit to stand without assistance.  No lower extremity tenderness nor edema.  No joint effusions. Neurologic: Cranial nerves II to XII grossly intact.  Normal LE DTRs bilaterally.  Negative supine straight leg raise bilaterally.  Normal speech and language. No gross focal neurologic deficits are appreciated. No gait instability. Skin:  Skin is warm, dry and intact. No rash noted. Psychiatric: Mood and affect are normal. Speech and behavior are  normal.  ____________________________________________   LABS (all labs ordered are listed, but only abnormal results are displayed)  Labs Reviewed  URINALYSIS, COMPLETE (UACMP) WITH MICROSCOPIC - Abnormal; Notable for the following components:      Result Value   Color, Urine STRAW (*)    APPearance CLEAR (*)    Hgb urine dipstick SMALL (*)    All other components within normal limits   ____________________________________________  EKG  ____________________________________________  RADIOLOGY I, Lissa Hoard, personally viewed and evaluated these images (plain radiographs) as part of my medical decision making, as well as reviewing the written report by the radiologist.  ED MD interpretation:  agree with report  Official radiology report(s): CT Lumbar Spine Wo Contrast  Result Date: 02/13/2021 CLINICAL DATA:  Lumbar radiculopathy.  History of lumbar fusion EXAM: CT LUMBAR SPINE WITHOUT CONTRAST TECHNIQUE: Multidetector CT imaging of the lumbar spine was performed without intravenous contrast administration. Multiplanar CT image reconstructions were also generated. COMPARISON:  CT lumbar spine 03/15/2011 FINDINGS: Segmentation: Normal Alignment: 11 mm anterolisthesis L5-S1 with progression since 2012. Remaining alignment anatomic Vertebrae: Negative for fracture or mass Paraspinal and other soft tissues: Negative for paraspinous mass or adenopathy. Disc levels: T12-L1: Mild disc degeneration.  Negative for stenosis L1-2: Mild disc degeneration.  Negative for stenosis L2-3: Disc bulging and mild facet degeneration. Mild spinal stenosis. Mild subarticular stenosis bilaterally. L3-4: Disc bulging and bilateral facet degeneration. Mild spinal stenosis. Mild subarticular stenosis bilaterally. L4-5: Unilateral pedicle screw fusion on the right. There is loosening of the right L4 screw. Right L5 screw shows no loosening. There is bone graft material and spacer in the disc space. No  evidence of solid bony fusion. Further resorption of bone graft since 2012. Posterior decompression. Negative for spinal or foraminal stenosis L5-S1: Bilateral pedicle screw and interbody fusion. There is loosening of the right S1 screw. Left-sided screws show no loosening. There is disc space narrowing. Interbody bone graft shows progressive resorption and no solid fusion identified. Progressive anterolisthesis. Mild right foraminal narrowing and moderate left foraminal narrowing due to spurring. IMPRESSION: Surgical fusion at L4-5 and L5-S1 with pseudarthrosis at both levels. Progressive anterolisthesis L5-S1. Mild degenerative change above the fusion with mild spinal stenosis at L2-3 and L3-4. Electronically Signed   By: Marlan Palau M.D.   On: 02/13/2021 13:22    ____________________________________________   PROCEDURES  Procedure(s) performed (including Critical Care):  Procedures  Toradol 30 mg IM Norflex 60 mg IM  ____________________________________________   INITIAL IMPRESSION / ASSESSMENT AND PLAN / ED COURSE  As part of my medical decision making, I reviewed the following data within the electronic MEDICAL RECORD NUMBER Old chart reviewed, Radiograph reviewed as noted, Notes from prior ED visits, and [place] Controlled Substance Database   Patient went ED evaluation of acute on chronic low back pain without any known offending factor.  Patient with a history of lumbar sacral fusion presents with midline low back pain.  Exam is benign and reassuring as it shows no red flags or any acute MSK deficit.  No signs of any cauda equina syndrome or spinal cord compression.  CT does reveal some worsening of spondylolisthesis at the level of fusion.  Symptoms likely represent a mild radiculopathy related to his underlying DDD.  Patient does endorse some improvement of his pain at time of this disposition, following ED medication administration.  He will be discharged with instructions to follow-up with  a local Ortho provider or consider referral back to his original surgery group, which is [facility].  A prescription for baclofen and prednisone provided at this time.  Return precautions been reviewed.   ____________________________________________   FINAL CLINICAL IMPRESSION(S) / ED DIAGNOSES  Final diagnoses:  Chronic bilateral low back pain without sciatica  Spondylolisthesis of lumbosacral region     ED Discharge Orders          Ordered    baclofen (LIORESAL) 10 MG tablet  Daily        02/13/21 1354    predniSONE (DELTASONE) 20 MG tablet  Daily with breakfast        02/13/21 1354             Note:  This document was prepared using Dragon voice recognition software and may include unintentional dictation errors.    Lissa Hoard, PA-C 02/13/21 1945    Arnaldo Natal, MD 02/15/21 [phone]

## 2022-01-12 ENCOUNTER — Other Ambulatory Visit: Payer: Self-pay | Admitting: Nurse Practitioner

## 2022-01-12 DIAGNOSIS — R1903 Right lower quadrant abdominal swelling, mass and lump: Secondary | ICD-10-CM

## 2022-01-30 ENCOUNTER — Other Ambulatory Visit: Payer: Self-pay | Admitting: Nurse Practitioner

## 2022-01-30 ENCOUNTER — Ambulatory Visit: Admission: RE | Admit: 2022-01-30 | Payer: Medicare HMO | Source: Ambulatory Visit

## 2022-01-30 DIAGNOSIS — R1903 Right lower quadrant abdominal swelling, mass and lump: Secondary | ICD-10-CM

## 2022-05-22 ENCOUNTER — Ambulatory Visit: Payer: Medicare HMO

## 2022-05-23 ENCOUNTER — Ambulatory Visit: Admission: RE | Admit: 2022-05-23 | Payer: Medicare HMO | Source: Ambulatory Visit

## 2022-05-23 ENCOUNTER — Other Ambulatory Visit (INDEPENDENT_AMBULATORY_CARE_PROVIDER_SITE_OTHER): Payer: Self-pay | Admitting: Nurse Practitioner

## 2022-05-23 DIAGNOSIS — G629 Polyneuropathy, unspecified: Secondary | ICD-10-CM

## 2022-05-23 DIAGNOSIS — M79606 Pain in leg, unspecified: Secondary | ICD-10-CM | POA: Insufficient documentation

## 2022-05-23 DIAGNOSIS — I739 Peripheral vascular disease, unspecified: Secondary | ICD-10-CM | POA: Insufficient documentation

## 2022-05-23 DIAGNOSIS — M79671 Pain in right foot: Secondary | ICD-10-CM

## 2022-05-23 NOTE — Progress Notes (Deleted)
MRN : [id number]  [name] is a 56 y.o. (September 02, 1965) male who presents with chief complaint of check circulation.  History of Present Illness:   The patient is seen for evaluation of painful lower extremities. Patient notes the pain is variable and not always associated with activity.  The pain is somewhat consistent day to day occurring on most days. The patient notes the pain also occurs with standing and routinely seems worse as the day wears on. The pain has been progressive over the past several years. The patient states these symptoms are causing  a negative impact on quality of life and daily activities which was a factor in the referral.  The patient has a  history of back problems and DJD of the lumbar and sacral spine.   The patient denies rest pain or dangling of an extremity off the side of the bed during the night for relief. No open wounds or sores at this time. No history of DVT or phlebitis. No prior vascular interventions or surgeries.    No outpatient medications have been marked as taking for the 05/25/22 encounter (Appointment) with Gilda Crease, Latina Craver, MD.    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 Al Corpus)   S/P endoscopy 01/16/07   Capsule, bleeding in small bowel   Schizoaffective disorder     Past Surgical History:   Procedure Laterality Date   BACK SURGERY  05/28/07   L4-S1 fusing, pins, screws and rods (Dr. Otelia Sergeant)   COLONOSCOPY WITH PROPOFOL N/A 12/29/2020   Procedure: COLONOSCOPY WITH PROPOFOL;  Surgeon: Pasty Spillers, MD;  Location: [facility] ENDOSCOPY;  Service: Endoscopy;  Laterality: N/A;   DOPPLER ECHOCARDIOGRAPHY  10/20/04   Normal    Social History Social History   Tobacco Use   Smoking status: Some Days    Packs/day: 0.50    Years: 14.00    Total pack years: 7.00    Types: Cigarettes   Smokeless tobacco: Never  Substance Use Topics   Alcohol use: No    Comment: Rarely   Drug use: No    Family History Family History  Problem Relation Age of Onset   Cancer Mother        Breast, bone CA in pelvis and lower back   Depression Mother        mild paranoid schizophrenic   Diabetes Maternal Grandfather    COPD Maternal Grandfather     Allergies  Allergen Reactions   Divalproex Sodium     REACTION: unspecified reaction   Papaya Derivatives      REVIEW OF SYSTEMS (Negative unless  checked)  Constitutional: [] Weight loss  [] Fever  [] Chills Cardiac: [] Chest pain   [] Chest pressure   [] Palpitations   [] Shortness of breath when laying flat   [] Shortness of breath with exertion. Vascular:  [x] Pain in legs with walking   [] Pain in legs at rest  [] History of DVT   [] Phlebitis   [] Swelling in legs   [] Varicose veins   [] Non-healing ulcers Pulmonary:   [] Uses home oxygen   [] Productive cough   [] Hemoptysis   [] Wheeze  [] COPD   [] Asthma Neurologic:  [] Dizziness   [] Seizures   [] History of stroke   [] History of TIA  [] Aphasia   [] Vissual changes   [] Weakness or numbness in arm   [] Weakness or numbness in leg Musculoskeletal:   [] Joint swelling   [] Joint pain   [] Low back pain Hematologic:  [] Easy bruising  [] Easy bleeding   [] Hypercoagulable state   [] Anemic Gastrointestinal:  [] Diarrhea   [] Vomiting  [] Gastroesophageal reflux/heartburn   [] Difficulty swallowing. Genitourinary:   [] Chronic kidney disease   [] Difficult urination  [] Frequent urination   [] Blood in urine Skin:  [] Rashes   [] Ulcers  Psychological:  [] History of anxiety   []  History of major depression.  Physical Examination  There were no vitals filed for this visit. There is no height or weight on file to calculate BMI. Gen: WD/WN, NAD Head: [place]/AT, No temporalis wasting.  Ear/Nose/Throat: Hearing grossly intact, nares w/o erythema or drainage Eyes: PER, EOMI, sclera nonicteric.  Neck: Supple, no masses.  No bruit or JVD.  Pulmonary:  Good air movement, no audible wheezing, no use of accessory muscles.  Cardiac: RRR, normal S1, S2, no Murmurs. Vascular:  mild trophic changes, no open wounds Vessel Right Left  Radial Palpable Palpable  PT Not Palpable Not Palpable  DP Not Palpable Not Palpable  Gastrointestinal: soft, non-distended. No guarding/no peritoneal signs.  Musculoskeletal: M/S 5/5 throughout.  No visible deformity.  Neurologic: CN 2-12 intact. Pain and light touch intact in extremities.  Symmetrical.  Speech is fluent. Motor exam as listed above. Psychiatric: Judgment intact, Mood & affect appropriate for pt's clinical situation. Dermatologic: No rashes or ulcers noted.  No changes consistent with cellulitis.   CBC Lab Results  Component Value Date   WBC 14.7 (H) 03/30/2017   HGB 15.2 03/30/2017   HCT 44.0 03/30/2017   MCV 89.7 03/30/2017   PLT 282 03/30/2017    BMET    Component Value Date/Time   NA 136 03/30/2017 1120   NA 137 04/11/2013 1730   K 3.5 03/30/2017 1120   K 3.1 (L) 04/11/2013 1730   CL 104 03/30/2017 1120   CL 104 04/11/2013 1730   CO2 24 03/30/2017 1120   CO2 24 04/11/2013 1730   GLUCOSE 114 (H) 03/30/2017 1120   GLUCOSE 190 (H) 04/11/2013 1730   BUN 16 03/30/2017 1120   BUN 21 (H) 04/11/2013 1730   CREATININE 1.02 03/30/2017 1120   CREATININE 1.18 04/11/2013 1730   CALCIUM 9.3 03/30/2017 1120   CALCIUM 9.3 04/11/2013 1730   GFRNONAA >60 03/30/2017  1120   GFRNONAA >60 04/11/2013 1730   GFRAA >60 03/30/2017 1120   GFRAA >60 04/11/2013 1730   CrCl cannot be calculated (Patient's most recent lab result is older than the maximum 21 days allowed.).  COAG Lab Results  Component Value Date   INR 0.9 05/27/2007   INR 1.0 01/16/2007   INR 1.0 01/13/2007    Radiology No results found.   Assessment/Plan There are no diagnoses linked to this  encounter.   Levora Dredge, MD  05/23/2022 6:22 PM

## 2022-05-25 ENCOUNTER — Encounter (INDEPENDENT_AMBULATORY_CARE_PROVIDER_SITE_OTHER): Payer: Self-pay | Admitting: Vascular Surgery

## 2022-05-25 ENCOUNTER — Encounter (INDEPENDENT_AMBULATORY_CARE_PROVIDER_SITE_OTHER): Payer: Self-pay

## 2022-05-25 DIAGNOSIS — I739 Peripheral vascular disease, unspecified: Secondary | ICD-10-CM

## 2022-05-25 DIAGNOSIS — J449 Chronic obstructive pulmonary disease, unspecified: Secondary | ICD-10-CM

## 2022-05-25 DIAGNOSIS — M79604 Pain in right leg: Secondary | ICD-10-CM

## 2022-05-25 DIAGNOSIS — I10 Essential (primary) hypertension: Secondary | ICD-10-CM

## 2022-05-25 DIAGNOSIS — E119 Type 2 diabetes mellitus without complications: Secondary | ICD-10-CM

## 2022-06-15 ENCOUNTER — Ambulatory Visit
Admission: RE | Admit: 2022-06-15 | Discharge: 2022-06-15 | Disposition: A | Discharge status: Home / Self Care | Payer: Medicare HMO | Source: Ambulatory Visit | Attending: Nurse Practitioner | Admitting: Nurse Practitioner

## 2022-06-15 DIAGNOSIS — R1903 Right lower quadrant abdominal swelling, mass and lump: Secondary | ICD-10-CM | POA: Insufficient documentation

## 2022-07-06 ENCOUNTER — Other Ambulatory Visit (INDEPENDENT_AMBULATORY_CARE_PROVIDER_SITE_OTHER): Payer: Self-pay | Admitting: Nurse Practitioner

## 2022-07-06 DIAGNOSIS — M7989 Other specified soft tissue disorders: Secondary | ICD-10-CM

## 2022-07-12 ENCOUNTER — Ambulatory Visit (INDEPENDENT_AMBULATORY_CARE_PROVIDER_SITE_OTHER): Payer: Medicare HMO | Admitting: Nurse Practitioner

## 2022-07-12 ENCOUNTER — Ambulatory Visit (INDEPENDENT_AMBULATORY_CARE_PROVIDER_SITE_OTHER): Payer: Self-pay

## 2022-07-12 ENCOUNTER — Encounter (INDEPENDENT_AMBULATORY_CARE_PROVIDER_SITE_OTHER): Payer: Self-pay | Admitting: Nurse Practitioner

## 2022-07-12 VITALS — BP 147/81 | HR 96 | Resp 16 | Wt 267.0 lb

## 2022-07-12 DIAGNOSIS — E785 Hyperlipidemia, unspecified: Secondary | ICD-10-CM | POA: Diagnosis not present

## 2022-07-12 DIAGNOSIS — I739 Peripheral vascular disease, unspecified: Secondary | ICD-10-CM | POA: Diagnosis not present

## 2022-07-12 DIAGNOSIS — M7989 Other specified soft tissue disorders: Secondary | ICD-10-CM | POA: Diagnosis not present

## 2022-07-12 DIAGNOSIS — E119 Type 2 diabetes mellitus without complications: Secondary | ICD-10-CM

## 2022-07-12 NOTE — Progress Notes (Signed)
Subjective:    Patient ID: [name], male    DOB: [birth year], 56 y.o.   MRN: [id number] Chief Complaint  Patient presents with   New Patient (Initial Visit)    Ref Carmela Hurt consult ble swelling and neuropathy    [name] is a 56 year old male referred by Jodi Marble, NP with regard to lower extremity edema.  The patient notes that typically the issues within his feet.  He notes that these swell and he notes that it is sporadic.  He notes that currently the swelling is not an issue but it has been ongoing for 3 years off and on.  He currently denies any claudication-like symptoms.  He denies any rest pain.  There are currently no open wounds or ulcerations.  The patient has not yet utilize compression stockings but he has noted that his primary care is supposed to be ordering him some.  Today noninvasive studies show no evidence of DVT or superficial thrombophlebitis bilaterally.  No evidence of deep venous insufficiency bilaterally no evidence of superficial venous reflux bilaterally.  The bilateral anterior tibial and posterior tibial arteries have normal, triphasic flow noted.    Review of Systems  Cardiovascular:  Positive for leg swelling.  All other systems reviewed and are negative.      Objective:   Physical Exam Vitals reviewed.  HENT:     Head: Normocephalic.  Cardiovascular:     Rate and Rhythm: Normal rate.     Pulses: Normal pulses.  Pulmonary:     Effort: Pulmonary effort is normal.  Musculoskeletal:     Right lower leg: No edema.     Left lower leg: No edema.  Skin:    General: Skin is warm and dry.  Neurological:     Mental Status: He is alert and oriented to person, place, and time.  Psychiatric:        Mood and Affect: Mood normal.        Behavior: Behavior normal.        Thought Content: Thought content normal.        Judgment: Judgment normal.     BP (!) 147/81 (BP Location: Left Arm)   Pulse 96   Resp 16   Wt 267 lb  (121.1 kg)   BMI 39.43 kg/m   Past Medical History:  Diagnosis Date   Abnormal CT scan, pelvis 01/16/07   Pars Defect L5 Mod Disc Bulge L4/5   Alcohol abuse    Bipolar disorder (HCC)    [facility] psych admission 12/2011 for SI   BRBPR (bright red blood per rectum) 05/30 - 01/01/07   [facility]   COPD (chronic obstructive pulmonary disease) (HCC)    Depression    Diabetes mellitus    Type II   ED (erectile dysfunction)    GI bleed 06/15 - 01/20/07   [facility] ,  NSaids, anemia   Hyperlipidemia    Hypertension    Lithium toxicity 7/1 - 02/02/07   [facility]   OSA (obstructive sleep apnea)    Overdose    Episodes in the past (2)   Plantar fasciitis    3 Cortisone shots in heel (Dr. Al Corpus)   S/P endoscopy 01/16/07   Capsule, bleeding in small bowel   Schizoaffective disorder     Social History   Socioeconomic History   Marital status: Divorced    Spouse name: Not on file   Number of children: 1   Years of education: Not on file  Highest education level: Not on file  Occupational History   Occupation: Chartered certified accountant, Environmental health practitioner.    Employer: DISABLED   Occupation: Disability from back surgery  Tobacco Use   Smoking status: Some Days    Packs/day: 0.50    Years: 14.00    Total pack years: 7.00    Types: Cigarettes   Smokeless tobacco: Never  Substance and Sexual Activity   Alcohol use: No    Comment: Rarely   Drug use: No   Sexual activity: Not on file  Other Topics Concern   Not on file  Social History Narrative   Not on file   Social Determinants of Health   Financial Resource Strain: Not on file  Food Insecurity: Not on file  Transportation Needs: Not on file  Physical Activity: Not on file  Stress: Not on file  Social Connections: Not on file  Intimate Partner Violence: Not on file    Past Surgical History:  Procedure Laterality Date   BACK SURGERY  05/28/07   L4-S1 fusing, pins, screws and rods (Dr. Otelia Sergeant)   COLONOSCOPY WITH PROPOFOL N/A 12/29/2020    Procedure: COLONOSCOPY WITH PROPOFOL;  Surgeon: Pasty Spillers, MD;  Location: [facility] ENDOSCOPY;  Service: Endoscopy;  Laterality: N/A;   DOPPLER ECHOCARDIOGRAPHY  10/20/04   Normal    Family History  Problem Relation Age of Onset   Cancer Mother        Breast, bone CA in pelvis and lower back   Depression Mother        mild paranoid schizophrenic   Diabetes Maternal Grandfather    COPD Maternal Grandfather     Allergies  Allergen Reactions   Divalproex Sodium     REACTION: unspecified reaction   Papaya Derivatives        Latest Ref Rng & Units 03/30/2017   11:20 AM 01/18/2017   12:48 PM 12/29/2016   11:48 AM  CBC  WBC 3.8 - 10.6 K/uL 14.7  14.5  11.8   Hemoglobin 13.0 - 18.0 g/dL 82.5  05.3  97.6   Hematocrit 40.0 - 52.0 % 44.0  42.0  42.9   Platelets 150 - 440 K/uL 282  305  313       CMP     Component Value Date/Time   NA 136 03/30/2017 1120   NA 137 04/11/2013 1730   K 3.5 03/30/2017 1120   K 3.1 (L) 04/11/2013 1730   CL 104 03/30/2017 1120   CL 104 04/11/2013 1730   CO2 24 03/30/2017 1120   CO2 24 04/11/2013 1730   GLUCOSE 114 (H) 03/30/2017 1120   GLUCOSE 190 (H) 04/11/2013 1730   BUN 16 03/30/2017 1120   BUN 21 (H) 04/11/2013 1730   CREATININE 1.02 03/30/2017 1120   CREATININE 1.18 04/11/2013 1730   CALCIUM 9.3 03/30/2017 1120   CALCIUM 9.3 04/11/2013 1730   PROT 7.5 03/30/2017 1120   PROT 7.0 04/11/2013 1730   ALBUMIN 4.6 03/30/2017 1120   ALBUMIN 3.6 04/11/2013 1730   AST 24 03/30/2017 1120   AST 27 04/11/2013 1730   ALT 22 03/30/2017 1120   ALT 40 04/11/2013 1730   ALKPHOS 94 03/30/2017 1120   ALKPHOS 119 04/11/2013 1730   BILITOT 0.5 03/30/2017 1120   BILITOT 0.2 04/11/2013 1730   GFRNONAA >60 03/30/2017 1120   GFRNONAA >60 04/11/2013 1730   GFRAA >60 03/30/2017 1120   GFRAA >60 04/11/2013 1730     No results found.     Assessment &  Plan:   1. Leg swelling Recommend:  I have had a long discussion with the patient  regarding swelling and why it  causes symptoms.  Patient will begin wearing graduated compression on a daily basis a prescription was given. The patient will  wear the stockings first thing in the morning and removing them in the evening. The patient is instructed specifically not to sleep in the stockings.   In addition, behavioral modification will be initiated.  This will include frequent elevation, use of over the counter pain medications and exercise such as walking.  Currently per the patient's admission the swelling is sporadic and currently under good control.  Based on this we will have him follow-up annually or sooner if he begins to have exacerbations that are not treated with conservative therapy as listed above. 2. Hyperlipidemia, unspecified hyperlipidemia type Continue statin as ordered and reviewed, no changes at this time  3. Type 2 diabetes mellitus without complication, without long-term current use of insulin (HCC) Continue hypoglycemic medications as already ordered, these medications have been reviewed and there are no changes at this time.  Hgb A1C to be monitored as already arranged by primary service  4. PAD (peripheral artery disease) (HCC) Noninvasive studies today do not indicate any presence of significant peripheral arterial disease.  The patient had normal triphasic flow in his bilateral posterior tibial and anterior tibial vessels.  Flow was essentially normal   Current Outpatient Medications on File Prior to Visit  Medication Sig Dispense Refill   amLODipine (NORVASC) 10 MG tablet Take 1 tablet (10 mg total) by mouth daily. 30 tablet 0   atorvastatin (LIPITOR) 20 MG tablet Take 20 mg by mouth at bedtime.     citalopram (CELEXA) 40 MG tablet Take 1 tablet (40 mg total) by mouth daily. 30 tablet 0   clonazePAM (KLONOPIN) 0.5 MG tablet Take 1 tablet (0.5 mg total) by mouth 3 (three) times daily as needed for anxiety. 45 tablet 0   gabapentin (NEURONTIN) 400 MG  capsule Take 2 capsules (800 mg total) by mouth 3 (three) times daily. 180 capsule 0   hydrOXYzine (ATARAX/VISTARIL) 25 MG tablet Take 25 mg by mouth 3 (three) times daily.     insulin detemir (LEVEMIR) 100 UNIT/ML injection Inject 0.35 mLs (35 Units total) into the skin 2 (two) times daily. 21 mL 0   lithium carbonate (LITHOBID) 300 MG CR tablet Take 2 tablets (600 mg total) by mouth every 12 (twelve) hours. 120 tablet 0   metFORMIN (GLUCOPHAGE) 1000 MG tablet Take 1 tablet (1,000 mg total) by mouth 2 (two) times daily with a meal. 60 tablet 0   methocarbamol (ROBAXIN) 750 MG tablet Take 1 tablet (750 mg total) by mouth every 8 (eight) hours as needed for muscle spasms. 45 tablet 0   paliperidone (INVEGA) 6 MG 24 hr tablet Take 2 tablets (12 mg total) by mouth every evening. 60 tablet 0   risperiDONE (RISPERDAL) 2 MG tablet Take 2 mg by mouth at bedtime.  0   traMADol (ULTRAM) 50 MG tablet Take 1 tablet (50 mg total) by mouth every 6 (six) hours as needed for severe pain. 30 tablet 0   traZODone (DESYREL) 150 MG tablet Take 1 tablet (150 mg total) by mouth at bedtime. 30 tablet 0   trihexyphenidyl (ARTANE) 5 MG tablet Take by mouth.     No current facility-administered medications on file prior to visit.    There are no Patient Instructions on file for this visit.  No follow-ups on file.   Kris Hartmann, NP

## 2022-08-15 ENCOUNTER — Encounter: Payer: Self-pay | Admitting: Cardiology

## 2022-08-15 ENCOUNTER — Ambulatory Visit: Payer: Medicare HMO | Attending: Cardiology | Admitting: Cardiology

## 2022-08-15 VITALS — BP 120/80 | HR 83 | Ht 68.0 in | Wt 256.8 lb

## 2022-08-15 DIAGNOSIS — Z9181 History of falling: Secondary | ICD-10-CM | POA: Insufficient documentation

## 2022-08-15 DIAGNOSIS — R600 Localized edema: Secondary | ICD-10-CM | POA: Diagnosis not present

## 2022-08-15 DIAGNOSIS — T24109D Burn of first degree of unspecified site of unspecified lower limb, except ankle and foot, subsequent encounter: Secondary | ICD-10-CM | POA: Insufficient documentation

## 2022-08-15 DIAGNOSIS — H9313 Tinnitus, bilateral: Secondary | ICD-10-CM | POA: Insufficient documentation

## 2022-08-15 DIAGNOSIS — N1831 Chronic kidney disease, stage 3a: Secondary | ICD-10-CM | POA: Insufficient documentation

## 2022-08-15 DIAGNOSIS — I10 Essential (primary) hypertension: Secondary | ICD-10-CM

## 2022-08-15 DIAGNOSIS — IMO0001 Reserved for inherently not codable concepts without codable children: Secondary | ICD-10-CM

## 2022-08-15 DIAGNOSIS — E782 Mixed hyperlipidemia: Secondary | ICD-10-CM | POA: Diagnosis not present

## 2022-08-15 DIAGNOSIS — F17200 Nicotine dependence, unspecified, uncomplicated: Secondary | ICD-10-CM | POA: Diagnosis not present

## 2022-08-15 DIAGNOSIS — R809 Proteinuria, unspecified: Secondary | ICD-10-CM | POA: Insufficient documentation

## 2022-08-15 DIAGNOSIS — R1903 Right lower quadrant abdominal swelling, mass and lump: Secondary | ICD-10-CM | POA: Insufficient documentation

## 2022-08-15 MED ORDER — AMLODIPINE BESYLATE 5 MG PO TABS: 5.0000 mg | ORAL_TABLET | Freq: Every day | ORAL | 3 refills | Status: DC

## 2022-08-15 MED ORDER — LISINOPRIL 10 MG PO TABS: 10.0000 mg | ORAL_TABLET | Freq: Every day | ORAL | 3 refills | Status: DC

## 2022-08-15 NOTE — Patient Instructions (Signed)
Medication Instructions:   DECREASE Amlodipine - take 1 tablet (5mg ) by mouth daily.   - if you have a lot of the 10mg  tablets you may take 1/2 tablet by mouth daily until you run out.   2. INCREASE Lisinopril - take one tablet (10mg ) by mouth daily.   *If you need a refill on your cardiac medications before your next appointment, please call your pharmacy*   Lab Work:  None Ordered  If you have labs (blood work) drawn today and your tests are completely normal, you will receive your results only by: [place] (if you have MyChart) OR A paper copy in the mail If you have any lab test that is abnormal or we need to change your treatment, we will call you to review the results.   Testing/Procedures:  Echocardiogram   Your physician has requested that you have an echocardiogram. Echocardiography is a painless test that uses sound waves to create images of your heart. It provides your doctor with information about the size and shape of your heart and how well your heart's chambers and valves are working. This procedure takes approximately one hour. There are no restrictions for this procedure. Please note; depending on visual quality an IV may need to be placed.     Follow-Up: At [facility], you and your health needs are our priority.  As part of our continuing mission to provide you with exceptional heart care, we have created designated Provider Care Teams.  These Care Teams include your primary Cardiologist (physician) and Advanced Practice Providers (APPs -  Physician Assistants and Nurse Practitioners) who all work together to provide you with the care you need, when you need it.  We recommend signing up for the patient portal called "MyChart".  Sign up information is provided on this After Visit Summary.  MyChart is used to connect with patients for Virtual Visits (Telemedicine).  Patients are able to view lab/test results, encounter notes, upcoming appointments,  etc.  Non-urgent messages can be sent to your provider as well.   To learn more about what you can do with MyChart, go to [email or web address].    Your next appointment:   6 - 8 week(s)  Provider:   Kate Sable, MD ONLY

## 2022-08-15 NOTE — Progress Notes (Signed)
Cardiology Office Note:    Date:  08/15/2022   ID:  [name], DOB 1965/09/11, MRN [id number]  PCP:  [name], MD   [name] Cardiologist:  [name], MD     Referring MD: [name]*   Chief Complaint  Patient presents with   New Patient (Initial Visit)    Lower extremity edema, no Hx, unable to confirm all meds with p.t    History of Present Illness:    [name] is a 57 y.o. male with a hx of hypertension, hyperlipidemia, diabetes, current smoker x 25+ years, presenting with leg edema.  States having leg edema on and off maybe once or twice a month.  Edema or because randomly, no relation with ambulation or leg elevation.  Denies chest pain, has occasional nonspecific shortness of breath.  States having a weakened abdominal wall leading to abdominal distention.  Currently smokes, is working on quitting.  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 Al Corpus)   S/P endoscopy 01/16/07   Capsule, bleeding in small bowel   Schizoaffective disorder     Past Surgical History:  Procedure Laterality Date   BACK SURGERY  05/28/07   L4-S1 fusing, pins, screws and rods (Dr. Otelia Sergeant)   COLONOSCOPY WITH PROPOFOL N/A 12/29/2020   Procedure: COLONOSCOPY WITH PROPOFOL;  Surgeon: Pasty Spillers, MD;   Location: [facility] ENDOSCOPY;  Service: Endoscopy;  Laterality: N/A;   DOPPLER ECHOCARDIOGRAPHY  10/20/04   Normal    Current Medications: Current Meds  Medication Sig   ASPIRIN LOW DOSE 81 MG tablet Take 81 mg by mouth daily.   atorvastatin (LIPITOR) 20 MG tablet Take 20 mg by mouth at bedtime.   gabapentin (NEURONTIN) 400 MG capsule Take 2 capsules (800 mg total) by mouth 3 (three) times daily.   hydrOXYzine (ATARAX/VISTARIL) 25 MG tablet Take 25 mg by mouth 3 (three) times daily.   insulin detemir (LEVEMIR) 100 UNIT/ML injection Inject 0.35 mLs (35 Units total) into the skin 2 (two) times daily.   LANTUS SOLOSTAR 100 UNIT/ML Solostar Pen Inject 100 Units into the skin daily.   lisinopril (ZESTRIL) 10 MG tablet Take 1 tablet (10 mg total) by mouth daily.   lithium carbonate (LITHOBID) 300 MG CR tablet Take 2 tablets (600 mg total) by mouth every 12 (twelve) hours. (Patient taking differently: Take 450 mg by mouth every 12 (twelve) hours. Two 450mg  tablets daily per patient)   metFORMIN (GLUCOPHAGE) 1000 MG tablet Take 1 tablet (1,000 mg total) by mouth 2 (two) times daily with a meal.   methocarbamol (ROBAXIN) 750 MG tablet Take 1 tablet (750 mg total) by mouth every 8 (eight)  hours as needed for muscle spasms.   paliperidone (INVEGA) 6 MG 24 hr tablet Take 2 tablets (12 mg total) by mouth every evening.   risperiDONE (RISPERDAL) 2 MG tablet Take 2 mg by mouth at bedtime.   traZODone (DESYREL) 150 MG tablet Take 1 tablet (150 mg total) by mouth at bedtime.   trihexyphenidyl (ARTANE) 5 MG tablet Take by mouth.   [DISCONTINUED] amLODipine (NORVASC) 10 MG tablet Take 1 tablet (10 mg total) by mouth daily.   [DISCONTINUED] lisinopril (ZESTRIL) 2.5 MG tablet Take 2.5 mg by mouth daily.     Allergies:   Divalproex sodium and Papaya derivatives   Social History   Socioeconomic History   Marital status: Divorced    Spouse name: Not on file   Number of children: 1   Years of education: Not on  file   Highest education level: Not on file  Occupational History   Occupation: Furniture conservator/restorer, Film/video editor.    Employer: DISABLED   Occupation: Disability from back surgery  Tobacco Use   Smoking status: Some Days    Packs/day: 0.50    Years: 14.00    Total pack years: 7.00    Types: Cigarettes   Smokeless tobacco: Never   Tobacco comments:    Trying to quit smoker  Substance and Sexual Activity   Alcohol use: Yes    Comment: Rarely, occasional beer   Drug use: No   Sexual activity: Not on file  Other Topics Concern   Not on file  Social History Narrative   Not on file   Social Determinants of Health   Financial Resource Strain: Not on file  Food Insecurity: Not on file  Transportation Needs: Not on file  Physical Activity: Not on file  Stress: Not on file  Social Connections: Not on file     Family History: The patient's family history includes COPD in his maternal grandfather; Cancer in his mother; Depression in his mother; Diabetes in his maternal grandfather.  ROS:   Please see the history of present illness.     All other systems reviewed and are negative.  EKGs/Labs/Other Studies Reviewed:    The following studies were reviewed today:   EKG:  EKG is  ordered today.  The ekg ordered today demonstrates normal sinus rhythm  Recent Labs: No results found for requested labs within last 365 days.  Recent Lipid Panel    Component Value Date/Time   CHOL 148 12/30/2016 0644   CHOL 208 (H) 01/09/2012 0530   TRIG 174 (H) 12/30/2016 0644   TRIG 469 (H) 01/09/2012 0530   HDL 42 12/30/2016 0644   HDL 37 (L) 01/09/2012 0530   CHOLHDL 3.5 12/30/2016 0644   VLDL 35 12/30/2016 0644   VLDL SEE COMMENT 01/09/2012 0530   LDLCALC 71 12/30/2016 0644   LDLCALC SEE COMMENT 01/09/2012 0530   LDLDIRECT 91.1 04/23/2014 1116     Risk Assessment/Calculations:             Physical Exam:    VS:  BP 120/80 (BP Location: Right Arm)   Pulse 83   Ht 5\' 8"  (1.727 m)    Wt 256 lb 12.8 oz (116.5 kg)   SpO2 98%   BMI 39.05 kg/m     Wt Readings from Last 3 Encounters:  08/15/22 256 lb 12.8 oz (116.5 kg)  07/12/22 267 lb (121.1 kg)  02/13/21 250 lb (113.4 kg)     GEN:  Well nourished, well developed in no acute distress  HEENT: Normal NECK: No JVD; No carotid bruits CARDIAC: RRR, no murmurs, rubs, gallops RESPIRATORY: Decreased breath sounds bilaterally, no wheezing ABDOMEN: Soft, non-tender, non-distended MUSCULOSKELETAL:  No edema; No deformity  SKIN: Warm and dry NEUROLOGIC:  Alert and oriented x 3 PSYCHIATRIC:  Normal affect   ASSESSMENT:    1. Lower extremity edema   2. Primary hypertension   3. Smoking   4. Mixed hyperlipidemia    PLAN:    In order of problems listed above:  Leg edema, could be side effect of amlodipine, no edema noted on exam today.  Obtain echo to evaluate any structural abnormalities, reduce Norvasc to 5 mg daily. Hypertension, BP controlled.  Reduce Norvasc to 5 mg daily, increase lisinopril to 10 mg daily.  Patient is a diabetic. Current smoker, smoking cessation advised. Hyperlipidemia, continue Lipitor 20 mg daily.  Follow-up after echo.      Medication Adjustments/Labs and Tests Ordered: Current medicines are reviewed at length with the patient today.  Concerns regarding medicines are outlined above.  Orders Placed This Encounter  Procedures   EKG 12-Lead   ECHOCARDIOGRAM COMPLETE   Meds ordered this encounter  Medications   DISCONTD: amLODipine (NORVASC) 5 MG tablet    Sig: Take 1 tablet (5 mg total) by mouth daily.    Dispense:  90 tablet    Refill:  3   lisinopril (ZESTRIL) 10 MG tablet    Sig: Take 1 tablet (10 mg total) by mouth daily.    Dispense:  90 tablet    Refill:  3   amLODipine (NORVASC) 5 MG tablet    Sig: Take 1 tablet (5 mg total) by mouth daily.    Dispense:  90 tablet    Refill:  3    Patient Instructions  Medication Instructions:   DECREASE Amlodipine - take 1 tablet  (5mg ) by mouth daily.   - if you have a lot of the 10mg  tablets you may take 1/2 tablet by mouth daily until you run out.   2. INCREASE Lisinopril - take one tablet (10mg ) by mouth daily.   *If you need a refill on your cardiac medications before your next appointment, please call your pharmacy*   Lab Work:  None Ordered  If you have labs (blood work) drawn today and your tests are completely normal, you will receive your results only by: MyChart Message (if you have MyChart) OR A paper copy in the mail If you have any lab test that is abnormal or we need to change your treatment, we will call you to review the results.   Testing/Procedures:  Echocardiogram   Your physician has requested that you have an echocardiogram. Echocardiography is a painless test that uses sound waves to create images of your heart. It provides your doctor with information about the size and shape of your heart and how well your heart's chambers and valves are working. This procedure takes approximately one hour. There are no restrictions for this procedure. Please note; depending on visual quality an IV may need to be placed.     Follow-Up: At [facility], you and your health needs are our priority.  As part of our continuing mission to provide you with exceptional heart care, we have created designated Provider Care Teams.  These Care Teams include your primary Cardiologist (physician) and Advanced Practice Providers (APPs -  Physician Assistants and Nurse Practitioners) who all work together to provide you with the care you need, when you need it.  We  recommend signing up for the patient portal called "MyChart".  Sign up information is provided on this After Visit Summary.  MyChart is used to connect with patients for Virtual Visits (Telemedicine).  Patients are able to view lab/test results, encounter notes, upcoming appointments, etc.  Non-urgent messages can be sent to your provider as well.    To learn more about what you can do with MyChart, go to [email or web address].    Your next appointment:   6 - 8 week(s)  Provider:   Kate Sable, MD ONLY      Signed, Kate Sable, MD  08/15/2022 9:29 AM    [place]

## 2022-08-18 ENCOUNTER — Telehealth: Payer: Self-pay | Admitting: Cardiology

## 2022-08-18 MED ORDER — LISINOPRIL 10 MG PO TABS: 10.0000 mg | ORAL_TABLET | Freq: Every day | ORAL | 0 refills | Status: DC

## 2022-08-18 NOTE — Telephone Encounter (Signed)
Requested Prescriptions   Signed Prescriptions Disp Refills   lisinopril (ZESTRIL) 10 MG tablet 30 tablet 0    Sig: Take 1 tablet (10 mg total) by mouth daily.    Authorizing Provider: Kate Sable    Ordering User: Britt Bottom

## 2022-08-18 NOTE — Telephone Encounter (Signed)
*  STAT* If patient is at the pharmacy, call can be transferred to refill team.   1. Which medications need to be refilled? (please list name of each medication and dose if known) new prescrip;tion for Lisinopril  2. Which pharmacy/location (including street and city if local pharmacy) is medication to be sent to? [place],  [place]. Do they need a 30 day or 90 day supply? 30 days

## 2022-08-18 NOTE — Addendum Note (Signed)
Addended by: Britt Bottom on: 08/18/2022 02:07 PM   Modules accepted: Orders

## 2022-09-11 ENCOUNTER — Ambulatory Visit: Payer: Medicare HMO | Admitting: Licensed Clinical Social Worker

## 2022-09-21 ENCOUNTER — Ambulatory Visit: Payer: Medicare HMO | Attending: Cardiology

## 2022-10-05 ENCOUNTER — Ambulatory Visit: Payer: Medicare HMO | Admitting: Cardiology

## 2022-10-08 ENCOUNTER — Encounter: Payer: Self-pay | Admitting: Radiology

## 2022-10-08 ENCOUNTER — Emergency Department
Admission: EM | Admit: 2022-10-08 | Discharge: 2022-10-08 | Disposition: A | Discharge status: Home / Self Care | Payer: Medicare HMO | Attending: Emergency Medicine | Admitting: Emergency Medicine

## 2022-10-08 ENCOUNTER — Other Ambulatory Visit: Payer: Self-pay

## 2022-10-08 DIAGNOSIS — I129 Hypertensive chronic kidney disease with stage 1 through stage 4 chronic kidney disease, or unspecified chronic kidney disease: Secondary | ICD-10-CM | POA: Insufficient documentation

## 2022-10-08 DIAGNOSIS — J449 Chronic obstructive pulmonary disease, unspecified: Secondary | ICD-10-CM | POA: Diagnosis not present

## 2022-10-08 DIAGNOSIS — E1122 Type 2 diabetes mellitus with diabetic chronic kidney disease: Secondary | ICD-10-CM | POA: Diagnosis not present

## 2022-10-08 DIAGNOSIS — F319 Bipolar disorder, unspecified: Secondary | ICD-10-CM

## 2022-10-08 DIAGNOSIS — N1831 Chronic kidney disease, stage 3a: Secondary | ICD-10-CM | POA: Insufficient documentation

## 2022-10-08 DIAGNOSIS — F259 Schizoaffective disorder, unspecified: Secondary | ICD-10-CM | POA: Diagnosis present

## 2022-10-08 DIAGNOSIS — F1290 Cannabis use, unspecified, uncomplicated: Secondary | ICD-10-CM | POA: Diagnosis not present

## 2022-10-08 LAB — COMPREHENSIVE METABOLIC PANEL
ALT: 28 U/L (ref 0–44)
AST: 22 U/L (ref 15–41)
Albumin: 4.4 g/dL (ref 3.5–5.0)
Alkaline Phosphatase: 92 U/L (ref 38–126)
Anion gap: 10 (ref 5–15)
BUN: 19 mg/dL (ref 6–20)
CO2: 20 mmol/L — ABNORMAL LOW (ref 22–32)
Calcium: 9.1 mg/dL (ref 8.9–10.3)
Chloride: 105 mmol/L (ref 98–111)
Creatinine, Ser: 1.31 mg/dL — ABNORMAL HIGH (ref 0.61–1.24)
GFR, Estimated: >60 mL/min (ref >60)
Glucose, Bld: 145 mg/dL — ABNORMAL HIGH (ref 70–99)
Potassium: 3.9 mmol/L (ref 3.5–5.1)
Sodium: 135 mmol/L (ref 135–145)
Total Bilirubin: 0.8 mg/dL (ref 0.3–1.2)
Total Protein: 8 g/dL (ref 6.5–8.1)

## 2022-10-08 LAB — URINE DRUG SCREEN, QUALITATIVE (ARMC ONLY)
Amphetamines, Ur Screen: NOT DETECTED
Barbiturates, Ur Screen: NOT DETECTED
Benzodiazepine, Ur Scrn: NOT DETECTED
Cannabinoid 50 Ng, Ur ~~LOC~~: POSITIVE — AB
Cocaine Metabolite,Ur ~~LOC~~: NOT DETECTED
MDMA (Ecstasy)Ur Screen: NOT DETECTED
Methadone Scn, Ur: NOT DETECTED
Opiate, Ur Screen: NOT DETECTED
Phencyclidine (PCP) Ur S: NOT DETECTED
Tricyclic, Ur Screen: NOT DETECTED

## 2022-10-08 LAB — CBC
HCT: 50.4 % (ref 39.0–52.0)
Hemoglobin: 17.3 g/dL — ABNORMAL HIGH (ref 13.0–17.0)
MCH: 29.6 pg (ref 26.0–34.0)
MCHC: 34.3 g/dL (ref 30.0–36.0)
MCV: 86.2 fL (ref 80.0–100.0)
Platelets: 274 10*3/uL (ref 150–400)
RBC: 5.85 MIL/uL — ABNORMAL HIGH (ref 4.22–5.81)
RDW: 13.2 % (ref 11.5–15.5)
WBC: 12.1 10*3/uL — ABNORMAL HIGH (ref 4.0–10.5)
nRBC: 0 % (ref 0.0–0.2)

## 2022-10-08 LAB — ACETAMINOPHEN LEVEL: Acetaminophen (Tylenol), Serum: <10 ug/mL — ABNORMAL LOW (ref 10–30)

## 2022-10-08 LAB — ETHANOL: Alcohol, Ethyl (B): <10 mg/dL (ref <10)

## 2022-10-08 LAB — LITHIUM LEVEL: Lithium Lvl: 0.37 mmol/L — ABNORMAL LOW (ref 0.60–1.20)

## 2022-10-08 LAB — SALICYLATE LEVEL: Salicylate Lvl: <7 mg/dL — ABNORMAL LOW (ref 7.0–30.0)

## 2022-10-08 MED ORDER — IBUPROFEN 600 MG PO TABS: 600.0000 mg | ORAL_TABLET | Freq: Three times a day (TID) | ORAL | Status: DC | PRN

## 2022-10-08 MED ORDER — ALUM & MAG HYDROXIDE-SIMETH 200-200-20 MG/5ML PO SUSP: 30.0000 mL | Freq: Four times a day (QID) | ORAL | Status: DC | PRN

## 2022-10-08 MED ORDER — ONDANSETRON HCL 4 MG PO TABS: 4.0000 mg | ORAL_TABLET | Freq: Three times a day (TID) | ORAL | Status: DC | PRN

## 2022-10-08 NOTE — ED Provider Notes (Signed)
[facility] Provider Note    Event Date/Time   First MD Initiated Contact with Patient 10/08/22 1128     (approximate)   History   Chief Complaint: Mental Health Problem   HPI  [name] is a 57 y.o. male with a history of schizoaffective disorder, OCD/PTSD who comes ED complaining of mood swings and crying spells for the past week.  Feels somewhat out of control.  Some vague passing SI but no serious suicidal intent or plan.  No ingestions, no alcohol or drug use.     Physical Exam   Triage Vital Signs: ED Triage Vitals  Enc Vitals Group     BP 10/08/22 1114 (!) 154/88     Pulse Rate 10/08/22 1114 (!) 114     Resp 10/08/22 1114 20     Temp 10/08/22 1114 98.4 F (36.9 C)     Temp Source 10/08/22 1114 Oral     SpO2 10/08/22 1114 98 %     Weight 10/08/22 1104 260 lb (117.9 kg)     Height 10/08/22 1104 '5\' 9"'$  (1.753 m)     Head Circumference --      Peak Flow --      Pain Score --      Pain Loc --      Pain Edu? --      Excl. in [place]? --     Most recent vital signs: Vitals:   10/08/22 1114  BP: (!) 154/88  Pulse: (!) 114  Resp: 20  Temp: 98.4 F (36.9 C)  SpO2: 98%    General: Awake, no distress. CV:  Good peripheral perfusion.  Resp:  Normal effort.  Abd:  No distention.  Other:  No wounds   ED Results / Procedures / Treatments   Labs (all labs ordered are listed, but only abnormal results are displayed) Labs Reviewed  SALICYLATE LEVEL - Abnormal; Notable for the following components:      Result Value   Salicylate Lvl [id number] (*)    All other components within normal limits  ACETAMINOPHEN LEVEL - Abnormal; Notable for the following components:   Acetaminophen (Tylenol), Serum <10 (*)    All other components within normal limits  URINE DRUG SCREEN, QUALITATIVE ([facility] ONLY) - Abnormal; Notable for the following components:   Cannabinoid 50 Ng, [place] POSITIVE (*)    All other components within normal limits  CBC -  Abnormal; Notable for the following components:   WBC 12.1 (*)    RBC 5.85 (*)    Hemoglobin 17.3 (*)    All other components within normal limits  COMPREHENSIVE METABOLIC PANEL - Abnormal; Notable for the following components:   CO2 20 (*)    Glucose, Bld 145 (*)    Creatinine, Ser 1.31 (*)    All other components within normal limits  ETHANOL  LITHIUM LEVEL     EKG    RADIOLOGY    PROCEDURES:  Procedures   MEDICATIONS ORDERED IN ED: Medications  ibuprofen (ADVIL) tablet 600 mg (has no administration in time range)  ondansetron (ZOFRAN) tablet 4 mg (has no administration in time range)  alum & mag hydroxide-simeth (MAALOX/MYLANTA) 200-200-20 MG/5ML suspension 30 mL (has no administration in time range)     IMPRESSION / MDM / ASSESSMENT AND PLAN / ED COURSE  I reviewed the triage vital signs and the nursing notes.  Patient's presentation is most consistent with exacerbation of chronic illness.  Patient presents with mood issues.  He does report that he feels much better right now, feels back to normal and denies any safety concerns.  Do not see any imminent danger to himself or others, not committable.  Will request behavioral medicine evaluation.  The patient has been placed in psychiatric observation due to the need to provide a safe environment for the patient while obtaining psychiatric consultation and evaluation, as well as ongoing medical and medication management to treat the patient's condition.  The patient has not been placed under full IVC at this time.   ----------------------------------------- 2:37 PM on 10/08/2022 ----------------------------------------- Case discussed with TTS after their evaluation.  Patient is already established with Dr. Darleene Cleaver.  No imminent safety concerns.  He is interested in a psychosocial day program and will be provided outpatient resources.     FINAL CLINICAL IMPRESSION(S) / ED DIAGNOSES   Final diagnoses:  Bipolar  affective disorder, remission status unspecified ([place])  Morbid obesity ([place])     Rx / DC Orders   ED Discharge Orders     None        Note:  This document was prepared using Dragon voice recognition software and may include unintentional dictation errors.   Carrie Mew, MD 10/08/22 [phone]

## 2022-10-08 NOTE — Discharge Instructions (Signed)
Please contact the providers listed below to join the [facility])   [address] [place], [place] [address] Phone: [phone] Fax: [phone] Email: [email or web address]   [employer] [address] [place], [place] [address] Phone: [phone] Fax: [phone] Email: [email or web address] .[facility] Day Activity [address] [place], [place] [address] Office: [phone] Cell: [phone] Fax: [phone] Email: [email or web address]

## 2022-10-08 NOTE — ED Triage Notes (Signed)
2 Bags:1 person belongings bag another white trash bag.  White Scientist, physiological wallet Two sets of keys Loose change Brown belt [employer] (pt kept with self)

## 2022-10-08 NOTE — ED Notes (Signed)
Pt endorses now that if he were to do something to harm himself, he would cut his wrist.
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
## 2022-10-08 NOTE — ED Triage Notes (Signed)
Pt endorses having crying spells since last night as well as continued depression. Pt states taking his normal medication for his depression and anxiety last night but it hasn't worked. PT endorse SI, intermittent AH/VH. Pt states these things come and go. Pt states no plan at this time, just doesn't want to live.

## 2022-10-23 ENCOUNTER — Ambulatory Visit: Payer: Medicare HMO | Admitting: Cardiology

## 2022-11-08 ENCOUNTER — Ambulatory Visit: Payer: Medicare HMO | Admitting: Cardiology

## 2022-11-14 ENCOUNTER — Other Ambulatory Visit: Payer: Medicare HMO

## 2022-11-17 ENCOUNTER — Ambulatory Visit: Payer: Medicare HMO | Admitting: Cardiology

## 2022-12-11 ENCOUNTER — Ambulatory Visit: Payer: Medicare HMO

## 2022-12-27 ENCOUNTER — Ambulatory Visit: Payer: Medicare HMO | Admitting: Cardiology

## 2022-12-28 ENCOUNTER — Telehealth: Payer: Self-pay | Admitting: Cardiology

## 2022-12-28 NOTE — Telephone Encounter (Signed)
Reviewed the patient's chart. His echo order was placed 08/15/22.  After reviewing the order, it doesn't expire until 08/16/23. Future Order Information  Expected Expires     08/22/2022 (Approximate) 08/16/2023       Will forward to scheduling team to assist patient with rescheduling his echo.   If he cancels another echo, he may need to come back in to see Dr. Azucena Cecil to re-evaluate how he is doing, but we could have Dr. Azucena Cecil weigh in on that if another test is scheduled and then cancelled by the patient.

## 2022-12-28 NOTE — Telephone Encounter (Signed)
Pt called to reschedule his ECHO that was on 6/6 but was only able to cancel it due to the expiration date needing to be extended/updated. Pt would like a callback to be rescheduled once updated.This ECHO has been rescheduled a few times since January due to pt finances he stated. Please advise.

## 2023-01-03 LAB — COLOGUARD: COLOGUARD: NEGATIVE

## 2023-01-04 ENCOUNTER — Other Ambulatory Visit: Payer: Medicare HMO

## 2023-01-04 NOTE — Telephone Encounter (Signed)
LMOV to reschedule follow up after ECHO  

## 2023-01-09 NOTE — Telephone Encounter (Signed)
Looks like Echo is scheduled for 02/20/2023 @ 7:30am

## 2023-01-26 ENCOUNTER — Ambulatory Visit: Payer: Medicare HMO | Admitting: Cardiology

## 2023-01-29 ENCOUNTER — Other Ambulatory Visit: Payer: Self-pay

## 2023-01-29 ENCOUNTER — Emergency Department
Admission: EM | Admit: 2023-01-29 | Discharge: 2023-01-29 | Disposition: A | Discharge status: Home / Self Care | Payer: Medicare HMO | Attending: Emergency Medicine | Admitting: Emergency Medicine

## 2023-01-29 DIAGNOSIS — I10 Essential (primary) hypertension: Secondary | ICD-10-CM | POA: Diagnosis not present

## 2023-01-29 DIAGNOSIS — E119 Type 2 diabetes mellitus without complications: Secondary | ICD-10-CM | POA: Diagnosis not present

## 2023-01-29 DIAGNOSIS — R519 Headache, unspecified: Secondary | ICD-10-CM | POA: Diagnosis present

## 2023-01-29 MED ORDER — SODIUM CHLORIDE 0.9 % IV BOLUS
1000.0000 mL | Freq: Once | INTRAVENOUS | Status: AC
  Administered 2023-01-29: 1000 mL via INTRAVENOUS

## 2023-01-29 MED ORDER — METOCLOPRAMIDE HCL 5 MG/ML IJ SOLN
10.0000 mg | Freq: Once | INTRAMUSCULAR | Status: AC
  Administered 2023-01-29: 10 mg via INTRAVENOUS
  Filled 2023-01-29: qty 2

## 2023-01-29 MED ORDER — KETOROLAC TROMETHAMINE 30 MG/ML IJ SOLN
30.0000 mg | Freq: Once | INTRAMUSCULAR | Status: AC
  Administered 2023-01-29: 30 mg via INTRAVENOUS
  Filled 2023-01-29: qty 1

## 2023-01-29 MED ORDER — DIPHENHYDRAMINE HCL 50 MG/ML IJ SOLN
12.5000 mg | Freq: Once | INTRAMUSCULAR | Status: AC
  Administered 2023-01-29: 12.5 mg via INTRAVENOUS
  Filled 2023-01-29: qty 1

## 2023-01-29 NOTE — ED Triage Notes (Signed)
Pt here for a HA, pt states HX of migraines. Pt denies injury or trauma, pt endorses it is getting better. Pt took 2 ibuprofen PTA.

## 2023-01-29 NOTE — ED Provider Notes (Signed)
[facility] Provider Note    Event Date/Time   First MD Initiated Contact with Patient 01/29/23 1909     (approximate)   History   Headache   HPI  [name] is a 57 y.o. male with PMH of diabetes, OSA, HTN and schizoaffective disorder, bipolar type, who presents for evaluation of a headache.  Patient states that it began today around 12 starting in the front of his head but it is now spread to the back of his head.  He took some ibuprofen and said that that provided some relief but "did not trust that it was going to keep the pain away".  He denies any vision changes.  He states he has had migraines before.      Physical Exam   Triage Vital Signs: ED Triage Vitals [01/29/23 1854]  Enc Vitals Group     BP (!) 136/102     Pulse Rate 100     Resp 18     Temp 98.2 F (36.8 C)     Temp Source Oral     SpO2 95 %     Weight      Height      Head Circumference      Peak Flow      Pain Score 8     Pain Loc      Pain Edu?      Excl. in [place]?     Most recent vital signs: Vitals:   01/29/23 1854 01/29/23 2104  BP: (!) 136/102   Pulse: 100 88  Resp: 18 18  Temp: 98.2 F (36.8 C)   SpO2: 95% 96%    General: Awake, no distress.  CV:  Good peripheral perfusion.  Resp:  Normal effort.  Abd:  No distention.  Other:  PERRL.  EOM intact.  No focal neurodeficits.  5/5 strength in upper and lower extremities.  Sensation intact in upper and lower extremities.   ED Results / Procedures / Treatments   Labs (all labs ordered are listed, but only abnormal results are displayed) Labs Reviewed - No data to display     PROCEDURES:  Critical Care performed: No  Procedures   MEDICATIONS ORDERED IN ED: Medications  sodium chloride 0.9 % bolus 1,000 mL (0 mLs Intravenous Stopped 01/29/23 2045)  metoCLOPramide (REGLAN) injection 10 mg (10 mg Intravenous Given 01/29/23 1948)  diphenhydrAMINE (BENADRYL) injection 12.5 mg (12.5 mg Intravenous  Given 01/29/23 1946)  ketorolac (TORADOL) 30 MG/ML injection 30 mg (30 mg Intravenous Given 01/29/23 1947)     IMPRESSION / MDM / ASSESSMENT AND PLAN / ED COURSE  I reviewed the triage vital signs and the nursing notes.                             57 year old male presents for evaluation of a headache.  Vital signs stable in triage aside from mildly elevated blood pressure.  On exam patient is NAD.  Differential diagnosis includes, but is not limited to, migraine, tension headache, cluster headache.  Patient's presentation is most consistent with acute illness / injury with system symptoms.  Patient was given a migraine cocktail including a fluid bolus, Reglan, Benadryl and Toradol.  After administration patient reported an improvement in his symptoms and stated he was ready to go home.  Given patient's improvement in his pain with this treatment I feel he is appropriate for outpatient management.  I advised him to  follow-up with his PCP as needed.  He can take Tylenol and ibuprofen for headaches in the future.  Patient was agreeable to plan, voiced understanding and was stable at discharge.      FINAL CLINICAL IMPRESSION(S) / ED DIAGNOSES   Final diagnoses:  Acute nonintractable headache, unspecified headache type     Rx / DC Orders   ED Discharge Orders     None        Note:  This document was prepared using Dragon voice recognition software and may include unintentional dictation errors.   Cameron Ali, PA-C 01/29/23 2315    Jene Every, MD 01/30/23 1048

## 2023-01-29 NOTE — ED Notes (Signed)
ED Provider at bedside. 

## 2023-01-29 NOTE — Discharge Instructions (Signed)
You were treated for headache today.  You can follow-up with your PCP as needed.  You can take Tylenol and ibuprofen as needed for headaches in the future.  Return to the ED with any new or worsening symptoms.

## 2023-01-29 NOTE — ED Notes (Addendum)
First nurse note: Pt here via [facility] with c/o of headache for the past 5 hours. EMS states no stroke like s/s noted.   150/80 HR: 110  CBG 210

## 2023-01-29 NOTE — ED Notes (Addendum)
Answered pts call bell x4 since being roomed.

## 2023-02-20 ENCOUNTER — Ambulatory Visit: Payer: Medicare HMO | Attending: Cardiology

## 2023-02-20 DIAGNOSIS — R600 Localized edema: Secondary | ICD-10-CM

## 2023-02-20 LAB — ECHOCARDIOGRAM COMPLETE
AR max vel: 4.01 cm2
AV Area VTI: 4.21 cm2
AV Area mean vel: 3.91 cm2
AV Mean grad: 3 mmHg
AV Peak grad: 4.8 mmHg
Ao pk vel: 1.09 m/s
Area-P 1/2: 3.42 cm2
Calc EF: 56 %
S' Lateral: 3.1 cm
Single Plane A2C EF: 58.6 %
Single Plane A4C EF: 55.6 %

## 2023-03-01 ENCOUNTER — Encounter: Payer: Self-pay | Admitting: Cardiology

## 2023-03-01 ENCOUNTER — Ambulatory Visit: Payer: Medicare HMO | Attending: Cardiology | Admitting: Cardiology

## 2023-03-01 VITALS — BP 140/88 | HR 87 | Ht 69.0 in | Wt 267.5 lb

## 2023-03-01 DIAGNOSIS — R600 Localized edema: Secondary | ICD-10-CM

## 2023-03-01 DIAGNOSIS — I10 Essential (primary) hypertension: Secondary | ICD-10-CM | POA: Diagnosis not present

## 2023-03-01 DIAGNOSIS — F17200 Nicotine dependence, unspecified, uncomplicated: Secondary | ICD-10-CM

## 2023-03-01 MED ORDER — LISINOPRIL 20 MG PO TABS: 20.0000 mg | ORAL_TABLET | Freq: Every day | ORAL | 0 refills | Status: DC

## 2023-03-01 NOTE — Patient Instructions (Signed)
Medication Instructions:   INCREASE Lisinopril - take one tablet ( 20mg ) by mouth daily.    *If you need a refill on your cardiac medications before your next appointment, please call your pharmacy*   Lab Work:  None Ordered  If you have labs (blood work) drawn today and your tests are completely normal, you will receive your results only by: MyChart Message (if you have MyChart) OR A paper copy in the mail If you have any lab test that is abnormal or we need to change your treatment, we will call you to review the results.   Testing/Procedures:  None Ordered   Follow-Up: At [facility], you and your health needs are our priority.  As part of our continuing mission to provide you with exceptional heart care, we have created designated Provider Care Teams.  These Care Teams include your primary Cardiologist (physician) and Advanced Practice Providers (APPs -  Physician Assistants and Nurse Practitioners) who all work together to provide you with the care you need, when you need it.  We recommend signing up for the patient portal called "MyChart".  Sign up information is provided on this After Visit Summary.  MyChart is used to connect with patients for Virtual Visits (Telemedicine).  Patients are able to view lab/test results, encounter notes, upcoming appointments, etc.  Non-urgent messages can be sent to your provider as well.   To learn more about what you can do with MyChart, go to [email or web address].    Your next appointment:   3 month(s)  Provider:   You may see [name], MD or one of the following Advanced Practice Providers on your designated Care Team:   Nicolasa Ducking, NP Eula Listen, PA-C Cadence Fransico Michael, PA-C Charlsie Quest, NP

## 2023-03-01 NOTE — Progress Notes (Signed)
Cardiology Office Note:    Date:  03/01/2023   ID:  [name], DOB 1966-06-28, MRN [id number]  PCP:  [name], MD   [facility] Providers Cardiologist:  [name], MD     Referring MD: [name]*   Chief Complaint  Patient presents with   Follow up Echo     "Doing well."     History of Present Illness:    [name] is a 57 y.o. male with a hx of hypertension, hyperlipidemia, diabetes, current smoker x 25+ years, presenting for follow-up.  Previously seen due to leg edema.  Echocardiogram was obtained to evaluate any structural abnormalities.  Amlodipine was reduced from 10 to 5 mg daily.  Overall swelling is better controlled with reduction of amlodipine.  He still smokes, is working on quitting.  Also working on eating healthier.   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 Al Corpus)   S/P endoscopy 01/16/07   Capsule, bleeding in small bowel   Schizoaffective disorder     Past Surgical History:  Procedure Laterality Date   BACK SURGERY  05/28/07   L4-S1 fusing, pins, screws and rods (Dr. Otelia Sergeant)   COLONOSCOPY WITH PROPOFOL N/A 12/29/2020   Procedure: COLONOSCOPY WITH PROPOFOL;  Surgeon: Pasty Spillers, MD;  Location: [facility] ENDOSCOPY;  Service: Endoscopy;  Laterality: N/A;   DOPPLER  ECHOCARDIOGRAPHY  10/20/04   Normal    Current Medications: Current Meds  Medication Sig   amLODipine (NORVASC) 5 MG tablet Take 1 tablet (5 mg total) by mouth daily.   ASPIRIN LOW DOSE 81 MG tablet Take 81 mg by mouth daily.   atorvastatin (LIPITOR) 20 MG tablet Take 20 mg by mouth at bedtime.   gabapentin (NEURONTIN) 400 MG capsule Take 2 capsules (800 mg total) by mouth 3 (three) times daily.   hydrOXYzine (ATARAX/VISTARIL) 25 MG tablet Take 25 mg by mouth 3 (three) times daily.   insulin detemir (LEVEMIR) 100 UNIT/ML injection Inject 0.35 mLs (35 Units total) into the skin 2 (two) times daily.   JARDIANCE 10 MG TABS tablet Take 10 mg by mouth daily.   LANTUS SOLOSTAR 100 UNIT/ML Solostar Pen Inject 30-40 Units into the skin 2 (two) times daily. 30 units every morning and 40 units at bedtime   lithium carbonate (ESKALITH) 450 MG ER tablet Take 450 mg by mouth 2 (two) times daily.   metFORMIN (GLUCOPHAGE) 1000 MG tablet Take 1 tablet (1,000 mg total) by mouth 2 (two) times daily with a meal.   paliperidone (INVEGA) 6 MG 24 hr tablet Take 2 tablets (12 mg total) by mouth every evening.   trazodone (DESYREL) 300 MG tablet Take  300 mg by mouth at bedtime.   trihexyphenidyl (ARTANE) 5 MG tablet Take by mouth.   [DISCONTINUED] lisinopril (ZESTRIL) 10 MG tablet Take 1 tablet (10 mg total) by mouth daily.     Allergies:   Divalproex sodium and Papaya derivatives   Social History   Socioeconomic History   Marital status: Divorced    Spouse name: Not on file   Number of children: 1   Years of education: Not on file   Highest education level: Not on file  Occupational History   Occupation: Chartered certified accountant, Environmental health practitioner.    Employer: DISABLED   Occupation: Disability from back surgery  Tobacco Use   Smoking status: Some Days    Current packs/day: 0.50    Average packs/day: 0.5 packs/day for 14.0 years (7.0 ttl pk-yrs)    Types: Cigarettes   Smokeless tobacco: Never   Tobacco  comments:    Trying to quit smoker  Vaping Use   Vaping status: Never Used  Substance and Sexual Activity   Alcohol use: Not Currently    Comment: Rarely, occasional beer   Drug use: No   Sexual activity: Not on file  Other Topics Concern   Not on file  Social History Narrative   Not on file   Social Determinants of Health   Financial Resource Strain: Not on file  Food Insecurity: Not on file  Transportation Needs: Not on file  Physical Activity: Not on file  Stress: Not on file  Social Connections: Not on file     Family History: The patient's family history includes COPD in his maternal grandfather; Cancer in his mother; Depression in his mother; Diabetes in his maternal grandfather.  ROS:   Please see the history of present illness.     All other systems reviewed and are negative.  EKGs/Labs/Other Studies Reviewed:    The following studies were reviewed today:   EKG:  EKG is  ordered today.  The ekg ordered today demonstrates normal sinus rhythm  Recent Labs: 10/08/2022: ALT 28; BUN 19; Creatinine, Ser 1.31; Hemoglobin 17.3; Platelets 274; Potassium 3.9; Sodium 135  Recent Lipid Panel    Component Value Date/Time   CHOL 148 12/30/2016 0644   CHOL 208 (H) 01/09/2012 0530   TRIG 174 (H) 12/30/2016 0644   TRIG 469 (H) 01/09/2012 0530   HDL 42 12/30/2016 0644   HDL 37 (L) 01/09/2012 0530   CHOLHDL 3.5 12/30/2016 0644   VLDL 35 12/30/2016 0644   VLDL SEE COMMENT 01/09/2012 0530   LDLCALC 71 12/30/2016 0644   LDLCALC SEE COMMENT 01/09/2012 0530   LDLDIRECT 91.1 04/23/2014 1116     Risk Assessment/Calculations:        Physical Exam:    VS:  BP (!) 140/88 (BP Location: Left Arm, Patient Position: Sitting, Cuff Size: Normal)   Pulse 87   Ht 5\' 9"  (1.753 m)   Wt 267 lb 8 oz (121.3 kg)   SpO2 97%   BMI 39.50 kg/m     Wt Readings from Last 3 Encounters:  03/01/23 267 lb 8 oz (121.3 kg)  10/08/22 260 lb (117.9 kg)  08/15/22 256 lb 12.8 oz (116.5 kg)      GEN:  Well nourished, well developed in no acute distress HEENT: Normal NECK: No JVD; No carotid bruits CARDIAC: RRR, no murmurs, rubs, gallops RESPIRATORY: Decreased breath sounds bilaterally, no wheezing ABDOMEN: Soft, non-tender, distended MUSCULOSKELETAL:  No edema; No deformity  SKIN: Warm and dry NEUROLOGIC:  Alert and oriented x  3 PSYCHIATRIC:  Normal affect   ASSESSMENT:    1. Lower extremity edema   2. Primary hypertension   3. Smoking    PLAN:    In order of problems listed above:  Leg edema, improved with reducing amlodipine dosage.  Echo with EF 60 to 65%, impaired relaxation. Hypertension, BP elevated..  Continue Norvasc 5 mg daily, increase lisinopril to 20 mg daily.   Current smoker, smoking cessation again advised.  Follow-up in 3 months for BP.      Medication Adjustments/Labs and Tests Ordered: Current medicines are reviewed at length with the patient today.  Concerns regarding medicines are outlined above.  No orders of the defined types were placed in this encounter.  Meds ordered this encounter  Medications   lisinopril (ZESTRIL) 20 MG tablet    Sig: Take 1 tablet (20 mg total) by mouth daily.    Dispense:  90 tablet    Refill:  0    Patient Instructions  Medication Instructions:   INCREASE Lisinopril - take one tablet ( 20mg ) by mouth daily.    *If you need a refill on your cardiac medications before your next appointment, please call your pharmacy*   Lab Work:  None Ordered  If you have labs (blood work) drawn today and your tests are completely normal, you will receive your results only by: MyChart Message (if you have MyChart) OR A paper copy in the mail If you have any lab test that is abnormal or we need to change your treatment, we will call you to review the results.   Testing/Procedures:  None Ordered   Follow-Up: At [facility], you and your health needs are our priority.  As part of our continuing mission  to provide you with exceptional heart care, we have created designated Provider Care Teams.  These Care Teams include your primary Cardiologist (physician) and Advanced Practice Providers (APPs -  Physician Assistants and Nurse Practitioners) who all work together to provide you with the care you need, when you need it.  We recommend signing up for the patient portal called "MyChart".  Sign up information is provided on this After Visit Summary.  MyChart is used to connect with patients for Virtual Visits (Telemedicine).  Patients are able to view lab/test results, encounter notes, upcoming appointments, etc.  Non-urgent messages can be sent to your provider as well.   To learn more about what you can do with MyChart, go to [email or web address].    Your next appointment:   3 month(s)  Provider:   You may see [name], MD or one of the following Advanced Practice Providers on your designated Care Team:   Nicolasa Ducking, NP Eula Listen, PA-C Cadence Fransico Michael, PA-C Charlsie Quest, NP    Signed, [name], MD  03/01/2023 10:11 AM    [facility]

## 2023-04-12 ENCOUNTER — Telehealth: Payer: Self-pay | Admitting: *Deleted

## 2023-04-12 NOTE — Telephone Encounter (Signed)
Pls call pt to set up LCS. He has a referral in. TY. His # [phone].

## 2023-04-13 ENCOUNTER — Other Ambulatory Visit: Payer: Self-pay

## 2023-04-13 DIAGNOSIS — Z122 Encounter for screening for malignant neoplasm of respiratory organs: Secondary | ICD-10-CM

## 2023-04-13 DIAGNOSIS — F17210 Nicotine dependence, cigarettes, uncomplicated: Secondary | ICD-10-CM

## 2023-04-13 DIAGNOSIS — Z87891 Personal history of nicotine dependence: Secondary | ICD-10-CM

## 2023-04-13 NOTE — Telephone Encounter (Signed)
Patient has been scheduled sdmv and LDCT

## 2023-04-13 NOTE — Telephone Encounter (Signed)
PT calling again. Please call. Thanks.

## 2023-05-02 ENCOUNTER — Ambulatory Visit (INDEPENDENT_AMBULATORY_CARE_PROVIDER_SITE_OTHER): Payer: Medicare PPO | Admitting: Nurse Practitioner

## 2023-05-02 DIAGNOSIS — F17200 Nicotine dependence, unspecified, uncomplicated: Secondary | ICD-10-CM

## 2023-05-02 NOTE — Progress Notes (Signed)
Virtual Visit via Telephone Note  I connected with [name] on 05/02/23 at  2:30 PM EDT by telephone and verified that I am speaking with the correct person using two identifiers.  Location:  remote Patient: [name] Provider: Posey Boyer  Shared Decision Making Visit Lung Cancer Screening Program [phone])   Eligibility: Age 57 y.o. Pack Years Smoking History Calculation 22 (# packs/per year x # years smoked) Recent History of coughing up blood  no Unexplained weight loss? no ( >Than 15 pounds within the last 6 months ) Prior History Lung / other cancer no (Diagnosis within the last 5 years already requiring surveillance chest CT Scans). Smoking Status Current Smoker  Visit Components: Discussion included one or more decision making aids. yes Discussion included risk/benefits of screening. yes Discussion included potential follow up diagnostic testing for abnormal scans. yes Discussion included meaning and risk of over diagnosis. yes Discussion included meaning and risk of False Positives. yes Discussion included meaning of total radiation exposure. yes  Counseling Included: Importance of adherence to annual lung cancer LDCT screening. yes Impact of comorbidities on ability to participate in the program. yes Ability and willingness to under diagnostic treatment. yes  Smoking Cessation Counseling: Current Smokers:  Discussed importance of smoking cessation. yes Information about tobacco cessation classes and interventions provided to patient. yes Patient provided with "ticket" for LDCT Scan. N/a Symptomatic Patient. no/a  Counseling(Intermediate counseling: > three minutes) 99406 Diagnosis Code: Tobacco Use Z72.0 Written Order for Lung Cancer Screening with LDCT placed in Epic.  (CT Chest Lung Cancer Screening Low Dose W/O CM) [id number] Z12.2-Screening of respiratory organs Z87.891-Personal history of nicotine dependence   Posey Boyer,  NP

## 2023-05-02 NOTE — Patient Instructions (Signed)

## 2023-05-04 ENCOUNTER — Ambulatory Visit
Admission: RE | Admit: 2023-05-04 | Discharge: 2023-05-04 | Disposition: A | Discharge status: Home / Self Care | Payer: Medicare PPO | Source: Ambulatory Visit | Attending: Family Medicine | Admitting: Family Medicine

## 2023-05-04 DIAGNOSIS — F17210 Nicotine dependence, cigarettes, uncomplicated: Secondary | ICD-10-CM | POA: Diagnosis present

## 2023-05-04 DIAGNOSIS — Z122 Encounter for screening for malignant neoplasm of respiratory organs: Secondary | ICD-10-CM | POA: Insufficient documentation

## 2023-05-04 DIAGNOSIS — Z87891 Personal history of nicotine dependence: Secondary | ICD-10-CM | POA: Diagnosis present

## 2023-05-21 ENCOUNTER — Other Ambulatory Visit: Payer: Self-pay | Admitting: Acute Care

## 2023-05-21 DIAGNOSIS — Z87891 Personal history of nicotine dependence: Secondary | ICD-10-CM

## 2023-05-21 DIAGNOSIS — Z122 Encounter for screening for malignant neoplasm of respiratory organs: Secondary | ICD-10-CM

## 2023-05-21 DIAGNOSIS — F17210 Nicotine dependence, cigarettes, uncomplicated: Secondary | ICD-10-CM

## 2023-05-24 ENCOUNTER — Telehealth: Payer: Self-pay | Admitting: Acute Care

## 2023-05-24 NOTE — Telephone Encounter (Signed)
Returned call to patient to review results of LDCT. Patient states he did not get letter explaining results.  Atherosclerosis and emphysema noted.  Patient is on a statin medication.  Cyst on kidney with no additional follow up recommended.  Small lung nodule with no suspicious findings for lung cancer.  Repeat scan in one year.

## 2023-06-01 ENCOUNTER — Ambulatory Visit: Payer: Medicare PPO | Attending: Cardiology | Admitting: Cardiology

## 2023-06-01 ENCOUNTER — Encounter: Payer: Self-pay | Admitting: Cardiology

## 2023-06-01 VITALS — BP 112/60 | HR 98 | Ht 69.0 in | Wt 268.4 lb

## 2023-06-01 DIAGNOSIS — E782 Mixed hyperlipidemia: Secondary | ICD-10-CM

## 2023-06-01 DIAGNOSIS — I10 Essential (primary) hypertension: Secondary | ICD-10-CM | POA: Diagnosis not present

## 2023-06-01 DIAGNOSIS — R600 Localized edema: Secondary | ICD-10-CM | POA: Diagnosis not present

## 2023-06-01 NOTE — Progress Notes (Signed)
Cardiology Office Note:    Date:  06/01/2023   ID:  [name], DOB 23-Oct-1965, MRN [id number]  PCP:  [name], MD   [facility] Providers Cardiologist:  [name], MD     Referring MD: [name]*   Chief Complaint  Patient presents with   Follow-up    3 Month follow up , medication reviewed verbally with patient    History of Present Illness:    [name] is a 57 y.o. male with a hx of hypertension, hyperlipidemia, diabetes, smoker x 25+ years, presenting for follow-up.   Lisinopril increased after last visit for BP control.  Tolerating medications, no side effects.  Edema has resolved since reducing amlodipine to 5 mg daily.  Has not smoked for several weeks now, hoping he can quit this time.  Feels well, no concerns at this time.   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 Al Corpus)   S/P endoscopy 01/16/07   Capsule, bleeding in small bowel   Schizoaffective disorder     Past Surgical History:  Procedure Laterality Date   BACK SURGERY  05/28/07   L4-S1 fusing, pins, screws and rods (Dr. Otelia Sergeant)   COLONOSCOPY WITH PROPOFOL N/A 12/29/2020   Procedure: COLONOSCOPY WITH PROPOFOL;  Surgeon: Pasty Spillers, MD;  Location: [facility] ENDOSCOPY;  Service: Endoscopy;  Laterality: N/A;   DOPPLER  ECHOCARDIOGRAPHY  10/20/04   Normal    Current Medications: Current Meds  Medication Sig   amLODipine (NORVASC) 5 MG tablet Take 1 tablet (5 mg total) by mouth daily.   ASPIRIN LOW DOSE 81 MG tablet Take 81 mg by mouth daily.   atorvastatin (LIPITOR) 20 MG tablet Take 20 mg by mouth at bedtime.   clonazePAM (KLONOPIN) 0.5 MG tablet Take 1 tablet (0.5 mg total) by mouth 3 (three) times daily as needed for anxiety.   gabapentin (NEURONTIN) 400 MG capsule Take 2 capsules (800 mg total) by mouth 3 (three) times daily.   hydrOXYzine (ATARAX/VISTARIL) 25 MG tablet Take 25 mg by mouth 3 (three) times daily.   insulin detemir (LEVEMIR) 100 UNIT/ML injection Inject 0.35 mLs (35 Units total) into the skin 2 (two) times daily.   JARDIANCE 10 MG TABS tablet Take 10 mg by mouth daily.   LANTUS SOLOSTAR 100 UNIT/ML Solostar Pen Inject 30-40 Units into the skin 2 (two) times daily. 30 units every morning and 40 units at bedtime   lisinopril (ZESTRIL) 20 MG tablet Take 1 tablet (20 mg total) by mouth daily.   lithium carbonate (ESKALITH) 450 MG ER tablet Take 450 mg by mouth 2 (two) times daily.   metFORMIN (GLUCOPHAGE) 1000 MG tablet Take  1 tablet (1,000 mg total) by mouth 2 (two) times daily with a meal.   methocarbamol (ROBAXIN) 750 MG tablet Take 1 tablet (750 mg total) by mouth every 8 (eight) hours as needed for muscle spasms.   paliperidone (INVEGA) 3 MG 24 hr tablet Take 3 mg by mouth at bedtime.   paliperidone (INVEGA) 6 MG 24 hr tablet Take 2 tablets (12 mg total) by mouth every evening.   trazodone (DESYREL) 300 MG tablet Take 300 mg by mouth at bedtime.   trihexyphenidyl (ARTANE) 5 MG tablet Take by mouth.     Allergies:   Divalproex sodium and Papaya derivatives   Social History   Socioeconomic History   Marital status: Divorced    Spouse name: Not on file   Number of children: 1   Years of education: Not on file   Highest education level: Not on file  Occupational History    Occupation: Chartered certified accountant, Environmental health practitioner.    Employer: DISABLED   Occupation: Disability from back surgery  Tobacco Use   Smoking status: Some Days    Current packs/day: 0.50    Average packs/day: 0.5 packs/day for 14.0 years (7.0 ttl pk-yrs)    Types: Cigarettes   Smokeless tobacco: Never   Tobacco comments:    Trying to quit smoker  Vaping Use   Vaping status: Never Used  Substance and Sexual Activity   Alcohol use: Not Currently    Comment: Rarely, occasional beer   Drug use: No   Sexual activity: Not on file  Other Topics Concern   Not on file  Social History Narrative   Not on file   Social Determinants of Health   Financial Resource Strain: Not on file  Food Insecurity: Not on file  Transportation Needs: Not on file  Physical Activity: Not on file  Stress: Not on file  Social Connections: Not on file     Family History: The patient's family history includes COPD in his maternal grandfather; Cancer in his mother; Depression in his mother; Diabetes in his maternal grandfather.  ROS:   Please see the history of present illness.     All other systems reviewed and are negative.  EKGs/Labs/Other Studies Reviewed:    The following studies were reviewed today:   EKG Interpretation Date/Time:  Friday June 01 2023 09:50:23 EDT Ventricular Rate:  98 PR Interval:  162 QRS Duration:  72 QT Interval:  362 QTC Calculation: 462 R Axis:   -47  Text Interpretation: Normal sinus rhythm Left axis deviation Confirmed by [name] ([id number]) on 06/01/2023 10:25:58 AM    Recent Labs: 10/08/2022: ALT 28; BUN 19; Creatinine, Ser 1.31; Hemoglobin 17.3; Platelets 274; Potassium 3.9; Sodium 135  Recent Lipid Panel    Component Value Date/Time   CHOL 148 12/30/2016 0644   CHOL 208 (H) 01/09/2012 0530   TRIG 174 (H) 12/30/2016 0644   TRIG 469 (H) 01/09/2012 0530   HDL 42 12/30/2016 0644   HDL 37 (L) 01/09/2012 0530   CHOLHDL 3.5 12/30/2016 0644   VLDL 35  12/30/2016 0644   VLDL SEE COMMENT 01/09/2012 0530   LDLCALC 71 12/30/2016 0644   LDLCALC SEE COMMENT 01/09/2012 0530   LDLDIRECT 91.1 04/23/2014 1116     Risk Assessment/Calculations:        Physical Exam:    VS:  BP 112/60 (BP Location: Left Arm, Patient Position: Sitting, Cuff Size: Normal)   Pulse 98   Ht 5\' 9"  (1.753 m)   Wt 268 lb  6.4 oz (121.7 kg)   SpO2 97%   BMI 39.64 kg/m     Wt Readings from Last 3 Encounters:  06/01/23 268 lb 6.4 oz (121.7 kg)  03/01/23 267 lb 8 oz (121.3 kg)  10/08/22 260 lb (117.9 kg)     GEN:  Well nourished, well developed in no acute distress HEENT: Normal NECK: No JVD; No carotid bruits CARDIAC: RRR, no murmurs, rubs, gallops RESPIRATORY: Decreased breath sounds bilaterally, no wheezing ABDOMEN: Soft, non-tender, distended MUSCULOSKELETAL:  No edema; No deformity  SKIN: Warm and dry NEUROLOGIC:  Alert and oriented x 3 PSYCHIATRIC:  Normal affect   ASSESSMENT:    1. Lower extremity edema   2. Primary hypertension   3. Mixed hyperlipidemia    PLAN:    In order of problems listed above:  Leg edema, resolved with reducing amlodipine to 5 mg daily.  Echo with EF 60 to 65%, impaired relaxation. Hypertension, BP controlled.  Continue Norvasc 5 mg daily, lisinopril 20 mg daily.   Current smoker, has not smoked for 6 weeks.  Advised to stay cigarette free.  advised.  Follow-up as needed.      Medication Adjustments/Labs and Tests Ordered: Current medicines are reviewed at length with the patient today.  Concerns regarding medicines are outlined above.  Orders Placed This Encounter  Procedures   EKG 12-Lead   No orders of the defined types were placed in this encounter.   Patient Instructions  Medication Instructions:   Your physician recommends that you continue on your current medications as directed. Please refer to the Current Medication list given to you today.  *If you need a refill on your cardiac medications before  your next appointment, please call your pharmacy*   Lab Work:  None Ordered  If you have labs (blood work) drawn today and your tests are completely normal, you will receive your results only by: MyChart Message (if you have MyChart) OR A paper copy in the mail If you have any lab test that is abnormal or we need to change your treatment, we will call you to review the results.   Testing/Procedures:  None Ordered   Follow-Up: At [facility], you and your health needs are our priority.  As part of our continuing mission to provide you with exceptional heart care, we have created designated Provider Care Teams.  These Care Teams include your primary Cardiologist (physician) and Advanced Practice Providers (APPs -  Physician Assistants and Nurse Practitioners) who all work together to provide you with the care you need, when you need it.  We recommend signing up for the patient portal called "MyChart".  Sign up information is provided on this After Visit Summary.  MyChart is used to connect with patients for Virtual Visits (Telemedicine).  Patients are able to view lab/test results, encounter notes, upcoming appointments, etc.  Non-urgent messages can be sent to your provider as well.   To learn more about what you can do with MyChart, go to [email or web address].    Your next appointment:    As needed    Signed, [name], MD  06/01/2023 10:46 AM    [facility]

## 2023-06-01 NOTE — Patient Instructions (Signed)
Medication Instructions:   Your physician recommends that you continue on your current medications as directed. Please refer to the Current Medication list given to you today.  *If you need a refill on your cardiac medications before your next appointment, please call your pharmacy*   Lab Work:  None Ordered  If you have labs (blood work) drawn today and your tests are completely normal, you will receive your results only by: MyChart Message (if you have MyChart) OR A paper copy in the mail If you have any lab test that is abnormal or we need to change your treatment, we will call you to review the results.   Testing/Procedures:  None Ordered   Follow-Up: At [facility], you and your health needs are our priority.  As part of our continuing mission to provide you with exceptional heart care, we have created designated Provider Care Teams.  These Care Teams include your primary Cardiologist (physician) and Advanced Practice Providers (APPs -  Physician Assistants and Nurse Practitioners) who all work together to provide you with the care you need, when you need it.  We recommend signing up for the patient portal called "MyChart".  Sign up information is provided on this After Visit Summary.  MyChart is used to connect with patients for Virtual Visits (Telemedicine).  Patients are able to view lab/test results, encounter notes, upcoming appointments, etc.  Non-urgent messages can be sent to your provider as well.   To learn more about what you can do with MyChart, go to [email or web address].    Your next appointment:    As needed 

## 2023-06-26 ENCOUNTER — Encounter: Payer: Self-pay | Admitting: Cardiology

## 2023-07-11 DIAGNOSIS — I890 Lymphedema, not elsewhere classified: Secondary | ICD-10-CM | POA: Insufficient documentation

## 2023-07-11 NOTE — Progress Notes (Unsigned)
MRN : [id number]  [name] is a 57 y.o. (06/23/1966) male who presents with chief complaint of legs swell.  History of Present Illness:   The patient returns to the office for followup evaluation regarding leg swelling.  The swelling has improved quite a bit and the pain associated with swelling has decreased substantially. There have not been any interval development of a ulcerations or wounds.  Since the previous visit the patient has been wearing graduated compression stockings and has noted some improvement in the lymphedema. The patient has been using compression routinely morning until night.  The patient also states elevation during the day and exercise (such as walking) is being done too.  Previous venous duplex showed no evidence of DVT or superficial thrombophlebitis bilaterally.  No evidence of deep venous insufficiency bilaterally no evidence of superficial venous reflux bilaterally.   The bilateral anterior tibial and posterior tibial arteries have normal, triphasic flow noted.    No outpatient medications have been marked as taking for the 07/12/23 encounter (Appointment) with Gilda Crease, Latina Craver, MD.    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 Al Corpus)   S/P endoscopy 01/16/07   Capsule, bleeding in small bowel    Schizoaffective disorder     Past Surgical History:  Procedure Laterality Date   BACK SURGERY  05/28/07   L4-S1 fusing, pins, screws and rods (Dr. Otelia Sergeant)   COLONOSCOPY WITH PROPOFOL N/A 12/29/2020   Procedure: COLONOSCOPY WITH PROPOFOL;  Surgeon: Pasty Spillers, MD;  Location: [facility] ENDOSCOPY;  Service: Endoscopy;  Laterality: N/A;   DOPPLER ECHOCARDIOGRAPHY  10/20/04   Normal    Social History Social History   Tobacco Use   Smoking status: Some Days    Current packs/day: 0.50    Average packs/day: 0.5 packs/day for 14.0 years (7.0 ttl pk-yrs)    Types: Cigarettes   Smokeless tobacco: Never   Tobacco comments:    Trying to quit smoker  Vaping Use   Vaping status: Never Used  Substance Use Topics   Alcohol use: Not Currently    Comment: Rarely, occasional beer   Drug use: No    Family History Family History  Problem Relation Age of Onset   Cancer Mother        Breast, bone CA in pelvis and lower back   Depression Mother        mild paranoid schizophrenic   Diabetes Maternal Grandfather    COPD Maternal Grandfather     Allergies  Allergen Reactions  Divalproex Sodium     REACTION: unspecified reaction   Papaya Derivatives      REVIEW OF SYSTEMS (Negative unless checked)  Constitutional: [] Weight loss  [] Fever  [] Chills Cardiac: [] Chest pain   [] Chest pressure   [] Palpitations   [] Shortness of breath when laying flat   [] Shortness of breath with exertion. Vascular:  [] Pain in legs with walking   [x] Pain in legs with standing  [] History of DVT   [] Phlebitis   [x] Swelling in legs   [] Varicose veins   [] Non-healing ulcers Pulmonary:   [] Uses home oxygen   [] Productive cough   [] Hemoptysis   [] Wheeze  [] COPD   [] Asthma Neurologic:  [] Dizziness   [] Seizures   [] History of stroke   [] History of TIA  [] Aphasia   [] Vissual changes   [] Weakness or numbness in arm   [] Weakness or numbness in leg Musculoskeletal:   [] Joint swelling   [] Joint pain   [] Low back  pain Hematologic:  [] Easy bruising  [] Easy bleeding   [] Hypercoagulable state   [] Anemic Gastrointestinal:  [] Diarrhea   [] Vomiting  [] Gastroesophageal reflux/heartburn   [] Difficulty swallowing. Genitourinary:  [] Chronic kidney disease   [] Difficult urination  [] Frequent urination   [] Blood in urine Skin:  [] Rashes   [] Ulcers  Psychological:  [] History of anxiety   []  History of major depression.  Physical Examination  There were no vitals filed for this visit. There is no height or weight on file to calculate BMI. Gen: WD/WN, NAD Head: [place]/AT, No temporalis wasting.  Ear/Nose/Throat: Hearing grossly intact, nares w/o erythema or drainage, pinna without lesions Eyes: PER, EOMI, sclera nonicteric.  Neck: Supple, no gross masses.  No JVD.  Pulmonary:  Good air movement, no audible wheezing, no use of accessory muscles.  Cardiac: RRR, precordium not hyperdynamic. Vascular:  scattered varicosities present bilaterally.  Mild venous stasis changes to the legs bilaterally.  3-4+ soft pitting edema, CEAP C4sEpAsPr  Vessel Right Left  Radial Palpable Palpable  Gastrointestinal: soft, non-distended. No guarding/no peritoneal signs.  Musculoskeletal: M/S 5/5 throughout.  No deformity.  Neurologic: CN 2-12 intact. Pain and light touch intact in extremities.  Symmetrical.  Speech is fluent. Motor exam as listed above. Psychiatric: Judgment intact, Mood & affect appropriate for pt's clinical situation. Dermatologic: Venous rashes no ulcers noted.  No changes consistent with cellulitis. Lymph : No lichenification or skin changes of chronic lymphedema.  CBC Lab Results  Component Value Date   WBC 12.1 (H) 10/08/2022   HGB 17.3 (H) 10/08/2022   HCT 50.4 10/08/2022   MCV 86.2 10/08/2022   PLT 274 10/08/2022    BMET    Component Value Date/Time   NA 135 10/08/2022 1157   NA 137 04/11/2013 1730   K 3.9 10/08/2022 1157   K 3.1 (L) 04/11/2013 1730   CL 105 10/08/2022 1157   CL 104 04/11/2013  1730   CO2 20 (L) 10/08/2022 1157   CO2 24 04/11/2013 1730   GLUCOSE 145 (H) 10/08/2022 1157   GLUCOSE 190 (H) 04/11/2013 1730   BUN 19 10/08/2022 1157   BUN 21 (H) 04/11/2013 1730   CREATININE 1.31 (H) 10/08/2022 1157   CREATININE 1.18 04/11/2013 1730   CALCIUM 9.1 10/08/2022 1157   CALCIUM 9.3 04/11/2013 1730   GFRNONAA >60 10/08/2022 1157   GFRNONAA >60 04/11/2013 1730   GFRAA >60 03/30/2017 1120   GFRAA >60 04/11/2013 1730   CrCl cannot be calculated (Patient's most recent lab result is older than the maximum 21 days allowed.).  COAG Lab  Results  Component Value Date   INR 0.9 05/27/2007   INR 1.0 01/16/2007   INR 1.0 01/13/2007    Radiology No results found.   Assessment/Plan There are no diagnoses linked to this encounter.   Levora Dredge, MD  07/11/2023 1:26 PM

## 2023-07-12 ENCOUNTER — Ambulatory Visit (INDEPENDENT_AMBULATORY_CARE_PROVIDER_SITE_OTHER): Payer: Medicare PPO | Admitting: Vascular Surgery

## 2023-07-19 ENCOUNTER — Other Ambulatory Visit: Payer: Self-pay

## 2023-07-19 MED ORDER — AMLODIPINE BESYLATE 5 MG PO TABS: 5.0000 mg | ORAL_TABLET | Freq: Every day | ORAL | 3 refills | Status: AC

## 2023-09-19 ENCOUNTER — Other Ambulatory Visit: Payer: Self-pay | Admitting: *Deleted

## 2023-09-19 MED ORDER — LISINOPRIL 20 MG PO TABS: 20.0000 mg | ORAL_TABLET | Freq: Every day | ORAL | 3 refills | Status: AC

## 2023-11-19 ENCOUNTER — Ambulatory Visit: Payer: Self-pay | Admitting: Internal Medicine

## 2023-11-19 NOTE — Progress Notes (Signed)
 No show

## 2023-12-12 ENCOUNTER — Emergency Department
Admission: EM | Admit: 2023-12-12 | Discharge: 2023-12-12 | Disposition: A | Discharge status: Home / Self Care | Attending: Emergency Medicine | Admitting: Emergency Medicine

## 2023-12-12 ENCOUNTER — Encounter: Payer: Self-pay | Admitting: Emergency Medicine

## 2023-12-12 ENCOUNTER — Other Ambulatory Visit: Payer: Self-pay

## 2023-12-12 DIAGNOSIS — F32A Depression, unspecified: Secondary | ICD-10-CM | POA: Diagnosis present

## 2023-12-12 DIAGNOSIS — I10 Essential (primary) hypertension: Secondary | ICD-10-CM | POA: Diagnosis not present

## 2023-12-12 LAB — COMPREHENSIVE METABOLIC PANEL WITH GFR
ALT: 25 U/L (ref 0–44)
AST: 28 U/L (ref 15–41)
Albumin: 4.4 g/dL (ref 3.5–5.0)
Alkaline Phosphatase: 91 U/L (ref 38–126)
Anion gap: 12 (ref 5–15)
BUN: 16 mg/dL (ref 6–20)
CO2: 19 mmol/L — ABNORMAL LOW (ref 22–32)
Calcium: 9.3 mg/dL (ref 8.9–10.3)
Chloride: 104 mmol/L (ref 98–111)
Creatinine, Ser: 1.53 mg/dL — ABNORMAL HIGH (ref 0.61–1.24)
GFR, Estimated: 52 mL/min — ABNORMAL LOW (ref >60)
Glucose, Bld: 181 mg/dL — ABNORMAL HIGH (ref 70–99)
Potassium: 3.7 mmol/L (ref 3.5–5.1)
Sodium: 135 mmol/L (ref 135–145)
Total Bilirubin: 0.7 mg/dL (ref 0.0–1.2)
Total Protein: 7.2 g/dL (ref 6.5–8.1)

## 2023-12-12 LAB — CBC
HCT: 45.7 % (ref 39.0–52.0)
Hemoglobin: 16 g/dL (ref 13.0–17.0)
MCH: 29.8 pg (ref 26.0–34.0)
MCHC: 35 g/dL (ref 30.0–36.0)
MCV: 85.1 fL (ref 80.0–100.0)
Platelets: 261 10*3/uL (ref 150–400)
RBC: 5.37 MIL/uL (ref 4.22–5.81)
RDW: 13 % (ref 11.5–15.5)
WBC: 12.4 10*3/uL — ABNORMAL HIGH (ref 4.0–10.5)
nRBC: 0 % (ref 0.0–0.2)

## 2023-12-12 LAB — ETHANOL: Alcohol, Ethyl (B): <15 mg/dL (ref <15)

## 2023-12-12 NOTE — ED Notes (Addendum)
 Pt unable to provide urine sample at this time

## 2023-12-12 NOTE — ED Notes (Signed)
 Psych services @ bedside to speak with Pt.

## 2023-12-12 NOTE — ED Triage Notes (Signed)
 Patient to ED via POV to get resources to find a psychologist. Pt states he has a psychiatrist but needs someone to speak with. Denies SI/HI. States "I have a lot of mental baggage." Hx PTSD, bipolar, schizoaffective, OCD

## 2023-12-12 NOTE — ED Provider Notes (Signed)
 [facility] Provider Note    Event Date/Time   First MD Initiated Contact with Patient 12/12/23 1328     (approximate)   History   Psychiatric Evaluation   HPI  [name] is a 58 y.o. male with history of depression, bipolar disorder, and schizoaffective disorder who presents with increased depression symptoms over the last several days.  The patient denies associated SI or HI.  He states he has no thoughts of wanting to harm himself.  He states he has been compliant with his medications.  He follows with a psychiatrist but does not have any outpatient counselor or therapist.  He states that he is really just here to see if we can provide him with any resources for outpatient therapy or counseling.  I reviewed past medical records.  The patient's most recent outpatient count in our system was follow-up with cardiology in November of last year for hypertension.  He has no recent hospitalizations.   Physical Exam   Triage Vital Signs: ED Triage Vitals  Encounter Vitals Group     BP 12/12/23 1300 (!) 159/74     Systolic BP Percentile --      Diastolic BP Percentile --      Pulse Rate 12/12/23 1300 (!) 113     Resp 12/12/23 1300 17     Temp 12/12/23 1302 98.2 F (36.8 C)     Temp Source 12/12/23 1302 Oral     SpO2 12/12/23 1300 96 %     Weight 12/12/23 1300 250 lb (113.4 kg)     Height 12/12/23 1300 5\' 9"  (1.753 m)     Head Circumference --      Peak Flow --      Pain Score 12/12/23 1300 0     Pain Loc --      Pain Education --      Exclude from Growth Chart --     Most recent vital signs: Vitals:   12/12/23 1302 12/12/23 1312  BP:    Pulse:    Resp:    Temp: 98.2 F (36.8 C)   SpO2:  96%     General: Awake, no distress.  CV:  Good peripheral perfusion.  Resp:  Normal effort.  Abd:  No distention.  Other:  Calm and cooperative.   ED Results / Procedures / Treatments   Labs (all labs ordered are listed, but only  abnormal results are displayed) Labs Reviewed  COMPREHENSIVE METABOLIC PANEL WITH GFR - Abnormal; Notable for the following components:      Result Value   CO2 19 (*)    Glucose, Bld 181 (*)    Creatinine, Ser 1.53 (*)    GFR, Estimated 52 (*)    All other components within normal limits  CBC - Abnormal; Notable for the following components:   WBC 12.4 (*)    All other components within normal limits  ETHANOL  URINE DRUG SCREEN, QUALITATIVE ([facility] ONLY)     EKG   RADIOLOGY   PROCEDURES:  Critical Care performed: No  Procedures   MEDICATIONS ORDERED IN ED: Medications - No data to display   IMPRESSION / MDM / ASSESSMENT AND PLAN / ED COURSE  I reviewed the triage vital signs and the nursing notes.  58 year old male with PMH as noted above presents with increasing symptoms of depression but no SI or HI.  He has been stable on outpatient psychiatric medications and is just here asking for resources for therapy/counseling.  Differential diagnosis includes, but is not limited to, major depressive disorder, bipolar disorder, schizoaffective disorder, adjustment disorder, other mood disorder.  Patient's presentation is most consistent with exacerbation of chronic illness.  At this time, the patient is calm and cooperative, answering questions appropriately, and able to contract for safety.  There is no indication for emergent psychiatry evaluation.  I will consult the psychiatry provider to provide the patient with outpatient counseling resources per his request.  Lab workup was obtained for medical clearance and is unremarkable.  CMP and CBC show no acute findings.  Ethanol is negative.  ----------------------------------------- 2:50 PM on 12/12/2023 -----------------------------------------  I consulted and discussed case with psychiatry provider Vice who talked to the patient and provided him with resources.  He is stable for discharge at this time.  Return precautions  provided.    FINAL CLINICAL IMPRESSION(S) / ED DIAGNOSES   Final diagnoses:  Depression, unspecified depression type     Rx / DC Orders   ED Discharge Orders     None        Note:  This document was prepared using Dragon voice recognition software and may include unintentional dictation errors.    Lind Repine, MD 12/12/23 1450

## 2023-12-12 NOTE — Discharge Instructions (Addendum)
 Return to the ER for any new, worsening, or persistent symptoms of depression, any thoughts of wanting to harm yourself or anyone else, or any other new or worsening symptoms that concern you.  Follow-up with your outpatient psychiatrist and primary care provider.

## 2024-01-25 NOTE — Progress Notes (Signed)
 No show

## 2024-01-28 ENCOUNTER — Ambulatory Visit: Payer: Self-pay | Admitting: Internal Medicine

## 2024-04-10 ENCOUNTER — Encounter: Payer: Self-pay | Admitting: Internal Medicine

## 2024-04-10 ENCOUNTER — Telehealth: Payer: Self-pay

## 2024-04-10 NOTE — Telephone Encounter (Signed)
 Called patient to schedule annual Lung CT. Phone rang and dropped to busy signal. Letter mailed to home.

## 2024-04-30 ENCOUNTER — Encounter: Payer: Self-pay | Admitting: Acute Care

## 2024-05-13 ENCOUNTER — Other Ambulatory Visit: Payer: Self-pay | Admitting: Acute Care

## 2024-05-13 DIAGNOSIS — Z122 Encounter for screening for malignant neoplasm of respiratory organs: Secondary | ICD-10-CM

## 2024-05-13 DIAGNOSIS — Z87891 Personal history of nicotine dependence: Secondary | ICD-10-CM

## 2024-05-13 DIAGNOSIS — F17210 Nicotine dependence, cigarettes, uncomplicated: Secondary | ICD-10-CM

## 2024-05-15 ENCOUNTER — Ambulatory Visit: Admitting: Internal Medicine

## 2024-05-15 NOTE — Progress Notes (Deleted)
 Name: [name] MRN: [id number] DOB: April 26, 1966    CHIEF COMPLAINT:  ASSESSMENT OF SLEEP APNEA EXCESSIVE DAYTIME SLEEPINESS   HISTORY OF PRESENT ILLNESS: Patient is seen today for problems and issues with sleep related to excessive daytime sleepiness Patient  has been having sleep problems for many years Patient has been having excessive daytime sleepiness for a long time Patient has been having extreme fatigue and tiredness, lack of energy +  very Loud snoring every night + struggling breathe at night and gasps for air + morning headaches + Nonrefreshing sleep  Discussed sleep data and reviewed with patient.  Encouraged proper weight management.  Discussed driving precautions and its relationship with hypersomnolence.  Discussed operating dangerous equipment and its relationship with hypersomnolence.  Discussed sleep hygiene, and benefits of a fixed sleep waked time.  The importance of getting eight or more hours of sleep discussed with patient.  Discussed limiting the use of the computer and television before bedtime.  Decrease naps during the day, so night time sleep will become enhanced.  Limit caffeine, and sleep deprivation.  HTN, stroke, and heart failure are potential risk factors.    EPWORTH SLEEP SCORE***    PAST MEDICAL HISTORY :   has a past medical history of Abnormal CT scan, pelvis (01/16/07), Alcohol abuse, Bipolar disorder (HCC), BRBPR (bright red blood per rectum) (05/30 - 01/01/07), COPD (chronic obstructive pulmonary disease) (HCC), Depression, Diabetes mellitus, ED (erectile dysfunction), GI bleed (06/15 - 01/20/07), Hyperlipidemia, Hypertension, Lithium  toxicity (7/1 - 02/02/07), OSA (obstructive sleep apnea), Overdose, Plantar fasciitis, S/P endoscopy (01/16/07), and Schizoaffective disorder.  has a past surgical history that includes doppler echocardiography (10/20/04); Back surgery (05/28/07); and Colonoscopy with propofol  (N/A,  12/29/2020). Prior to Admission medications   Medication Sig Start Date End Date Taking? Authorizing Provider  Blood Glucose Monitoring Suppl (ACCU-CHEK GUIDE) w/Device KIT  04/07/24  Yes [provider]  JARDIANCE 25 MG TABS tablet Take 25 mg by mouth daily. 04/07/24  Yes [provider]  OZEMPIC, 0.25 OR 0.5 MG/DOSE, 2 MG/3ML SOPN  04/07/24  Yes [provider]  paliperidone  (INVEGA ) 9 MG 24 hr tablet Take 9 mg by mouth daily. 03/17/24  Yes [provider]  amLODipine  (NORVASC ) 5 MG tablet Take 1 tablet (5 mg total) by mouth daily. 07/19/23   Darliss Rogue, MD  ASPIRIN LOW DOSE 81 MG tablet Take 81 mg by mouth daily. 08/07/22   [provider]  atorvastatin (LIPITOR) 20 MG tablet Take 20 mg by mouth at bedtime. 01/05/20   [provider]  clonazePAM  (KLONOPIN ) 0.5 MG tablet Take 1 tablet (0.5 mg total) by mouth 3 (three) times daily as needed for anxiety. 01/15/17   Ragena Odor, MD  gabapentin  (NEURONTIN ) 400 MG capsule Take 2 capsules (800 mg total) by mouth 3 (three) times daily. 01/15/17   Ragena Odor, MD  hydrOXYzine  (ATARAX /VISTARIL ) 25 MG tablet Take 25 mg by mouth 3 (three) times daily. 01/28/20   [provider]  insulin  detemir (LEVEMIR ) 100 UNIT/ML injection Inject 0.35 mLs (35 Units total) into the skin 2 (two) times daily. 01/15/17   Ragena Odor, MD  LANTUS  SOLOSTAR 100 UNIT/ML Solostar Pen Inject 30-40 Units into the skin 2 (two) times daily. 30 units every morning and 40 units at bedtime 08/07/22   [provider]  lisinopril  (ZESTRIL ) 20 MG tablet Take 1 tablet (20 mg total) by mouth daily. 09/19/23 09/13/24  Darliss Rogue, MD  lithium  carbonate (ESKALITH ) 450 MG ER tablet Take 450  mg by mouth 2 (two) times daily. 09/21/22   [provider]  metFORMIN  (GLUCOPHAGE ) 1000 MG tablet Take 1 tablet (1,000 mg total) by mouth 2 (two) times daily with a meal. 01/05/17    Ragena Odor, MD  methocarbamol  (ROBAXIN ) 750 MG tablet Take 1 tablet (750 mg total) by mouth every 8 (eight) hours as needed for muscle spasms. 01/05/17   Ragena Odor, MD  trazodone  (DESYREL ) 300 MG tablet Take 300 mg by mouth at bedtime.    [provider]  trihexyphenidyl (ARTANE) 5 MG tablet Take by mouth. 01/15/20   [provider]   Allergies  Allergen Reactions   Divalproex Sodium     REACTION: unspecified reaction   Papaya Derivatives     FAMILY HISTORY:  family history includes COPD in his maternal grandfather; Cancer in his mother; Depression in his mother; Diabetes in his maternal grandfather. SOCIAL HISTORY:  reports that he has been smoking cigarettes. He has a 7 pack-year smoking history. He has never used smokeless tobacco. He reports that he does not currently use alcohol. He reports that he does not use drugs.     VITAL SIGNS: @VSRANGES @     Review of Systems: Gen:  Denies  fever, sweats, chills weight loss  HEENT: Denies blurred vision, double vision, ear pain, eye pain, hearing loss, nose bleeds, sore throat Cardiac:  No dizziness, chest pain or heaviness, chest tightness,edema, No JVD Resp:   No cough, -sputum production, -shortness of breath,-wheezing, -hemoptysis,  Other:  All other systems negative   Physical Examination:   General Appearance: No distress  EYES PERRLA, EOM intact.   NECK Supple, No JVD Pulmonary: normal breath sounds, No wheezing.  CardiovascularNormal S1,S2.  No m/r/g.   Abdomen: Benign, Soft, non-tender. Neurology UE/LE 5/5 strength, no focal deficits Ext pulses intact, cap refill intact ALL OTHER ROS ARE NEGATIVE     ASSESSMENT AND PLAN SYNOPSIS  Patient with signs and symptoms of excessive daytime sleepiness with probable underlying diagnosis of obstructive sleep apnea in the setting of obesity and deconditioned state   Recommend Sleep Study for definitve  diagnosis  Obesity -recommend significant weight loss -recommend changing diet  Deconditioned state -Recommend increased daily activity and exercise   MEDICATION ADJUSTMENTS/LABS AND TESTS ORDERED: Recommend Sleep Study Recommend weight loss   CURRENT MEDICATIONS REVIEWED AT LENGTH WITH PATIENT TODAY   Patient  satisfied with Plan of action and management. All questions answered   Follow up    I spent a total of *** minutes dedicated to the care of this patient on the date of this encounter to include pre-visit review of records, face-to-face time with the patient discussing conditions above, post visit ordering of testing, clinical documentation with the electronic health record, making appropriate referrals as documented, and communicating necessary information to the patient's healthcare team.     Nickolas Alm Cellar, M.D.  [facility]  Medical Director [facility]

## 2024-05-20 ENCOUNTER — Telehealth: Payer: Self-pay

## 2024-05-20 NOTE — Telephone Encounter (Signed)
 I have spoke to this patient multiple times and sent a letter to him with the appt info I will call him tomorrow again and give him the appt info

## 2024-05-20 NOTE — Telephone Encounter (Signed)
 Copied from CRM #[id number]. Topic: General - Other >> May 20, 2024 10:23 AM Russell PARAS wrote: Reason for CRM:   Pt is contacting clinic to get appt info for his CT of chest ([facility]) on 10/23. He reports he is having memory issues and is finding it hard to remember information. Requested if possible could someone from clinic contact him on 10/22 to remind him of CT appt. Advised would receive reminder call for appt, but pt requested clinic reaching out  CB#  [phone]

## 2024-05-20 NOTE — Telephone Encounter (Signed)
 Candyce can you help with this patients issue

## 2024-05-21 NOTE — Telephone Encounter (Signed)
 Atc patient rings fast busy

## 2024-05-22 ENCOUNTER — Ambulatory Visit: Admission: RE | Admit: 2024-05-22 | Source: Ambulatory Visit

## 2024-05-29 ENCOUNTER — Ambulatory Visit
Admission: RE | Admit: 2024-05-29 | Discharge: 2024-05-29 | Disposition: A | Discharge status: Home / Self Care | Source: Ambulatory Visit | Attending: Acute Care | Admitting: Acute Care

## 2024-05-29 DIAGNOSIS — Z122 Encounter for screening for malignant neoplasm of respiratory organs: Secondary | ICD-10-CM | POA: Diagnosis present

## 2024-05-29 DIAGNOSIS — F17210 Nicotine dependence, cigarettes, uncomplicated: Secondary | ICD-10-CM | POA: Diagnosis present

## 2024-05-29 DIAGNOSIS — Z87891 Personal history of nicotine dependence: Secondary | ICD-10-CM | POA: Diagnosis present

## 2024-06-04 ENCOUNTER — Other Ambulatory Visit: Payer: Self-pay

## 2024-06-04 DIAGNOSIS — Z87891 Personal history of nicotine dependence: Secondary | ICD-10-CM

## 2024-06-04 DIAGNOSIS — Z122 Encounter for screening for malignant neoplasm of respiratory organs: Secondary | ICD-10-CM

## 2024-06-04 DIAGNOSIS — F17210 Nicotine dependence, cigarettes, uncomplicated: Secondary | ICD-10-CM

## 2024-06-18 ENCOUNTER — Ambulatory Visit: Attending: Nurse Practitioner | Admitting: Physical Therapy

## 2024-06-18 DIAGNOSIS — R296 Repeated falls: Secondary | ICD-10-CM | POA: Insufficient documentation

## 2024-06-18 DIAGNOSIS — M6281 Muscle weakness (generalized): Secondary | ICD-10-CM | POA: Insufficient documentation

## 2024-06-18 DIAGNOSIS — R262 Difficulty in walking, not elsewhere classified: Secondary | ICD-10-CM | POA: Insufficient documentation

## 2024-06-18 DIAGNOSIS — R2689 Other abnormalities of gait and mobility: Secondary | ICD-10-CM | POA: Insufficient documentation

## 2024-06-24 ENCOUNTER — Ambulatory Visit

## 2024-06-24 DIAGNOSIS — R296 Repeated falls: Secondary | ICD-10-CM | POA: Diagnosis present

## 2024-06-24 DIAGNOSIS — M6281 Muscle weakness (generalized): Secondary | ICD-10-CM

## 2024-06-24 DIAGNOSIS — R262 Difficulty in walking, not elsewhere classified: Secondary | ICD-10-CM

## 2024-06-24 DIAGNOSIS — R2689 Other abnormalities of gait and mobility: Secondary | ICD-10-CM
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
## 2024-06-25 ENCOUNTER — Ambulatory Visit

## 2024-07-01 ENCOUNTER — Ambulatory Visit: Admitting: Physical Therapy

## 2024-07-01 ENCOUNTER — Telehealth: Payer: Self-pay | Admitting: Physical Therapy

## 2024-07-01 DIAGNOSIS — M6281 Muscle weakness (generalized): Secondary | ICD-10-CM | POA: Insufficient documentation

## 2024-07-01 DIAGNOSIS — R41841 Cognitive communication deficit: Secondary | ICD-10-CM | POA: Insufficient documentation

## 2024-07-01 DIAGNOSIS — R2689 Other abnormalities of gait and mobility: Secondary | ICD-10-CM | POA: Insufficient documentation

## 2024-07-01 DIAGNOSIS — R296 Repeated falls: Secondary | ICD-10-CM | POA: Insufficient documentation

## 2024-07-01 DIAGNOSIS — R262 Difficulty in walking, not elsewhere classified: Secondary | ICD-10-CM | POA: Insufficient documentation

## 2024-07-01 NOTE — Telephone Encounter (Signed)
 Pt contacted via telephone and SPT left voice mail informing of missed appointment and informed pt of future PT appointment date and time.     Massie Dollar PT, DPT  Physical Therapist - [facility]  9:15 AM 07/01/24

## 2024-07-04 ENCOUNTER — Ambulatory Visit

## 2024-07-04 ENCOUNTER — Ambulatory Visit: Admitting: Speech Pathology

## 2024-07-04 DIAGNOSIS — R41841 Cognitive communication deficit: Secondary | ICD-10-CM
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
## 2024-07-07 ENCOUNTER — Telehealth: Payer: Self-pay | Admitting: Speech Pathology

## 2024-07-07 NOTE — Telephone Encounter (Signed)
 This clinical research associate reached out to pt's sister for additional historical information regarding pt's cognitive communication abilities. She reports that pt has been complaining of memory loss for a while. This clinical research associate shared some of symptoms that pt presented with and she reports that these are chronic in nature. She reports providing pt with calendars to help with orientation and reminding pt of appts. She further reports decreased flexibility in being able to implement strategies d/t many of his diagnoses.   She states that pt has voiced interested in assisted living as more and more things are harder for him to do.   This clinical research associate provided sister with information for [facility] [phone]  [phone]).  Tysheem Accardo B. Rubbie, M.S., CCC-SLP, Tree Surgeon Certified Brain Injury Specialist [facility]  [facility] Rehabilitation Services Office [phone] Ascom [phone] Fax [phone]

## 2024-07-08 ENCOUNTER — Ambulatory Visit

## 2024-07-09 ENCOUNTER — Telehealth: Payer: Self-pay | Admitting: Speech Pathology
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
## 2024-07-10 ENCOUNTER — Ambulatory Visit: Admitting: Internal Medicine

## 2024-07-11 ENCOUNTER — Ambulatory Visit: Admitting: Speech Pathology

## 2024-07-11 ENCOUNTER — Ambulatory Visit

## 2024-07-15 ENCOUNTER — Ambulatory Visit

## 2024-07-15 ENCOUNTER — Ambulatory Visit: Admitting: Speech Pathology

## 2024-07-15 DIAGNOSIS — R296 Repeated falls: Secondary | ICD-10-CM

## 2024-07-15 DIAGNOSIS — R2689 Other abnormalities of gait and mobility: Secondary | ICD-10-CM

## 2024-07-15 DIAGNOSIS — M6281 Muscle weakness (generalized): Secondary | ICD-10-CM

## 2024-07-15 DIAGNOSIS — R41841 Cognitive communication deficit: Secondary | ICD-10-CM

## 2024-07-15 DIAGNOSIS — R262 Difficulty in walking, not elsewhere classified: Secondary | ICD-10-CM
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
## 2024-07-17 ENCOUNTER — Ambulatory Visit

## 2024-07-17 ENCOUNTER — Ambulatory Visit: Admitting: Speech Pathology

## 2024-07-17 DIAGNOSIS — R296 Repeated falls: Secondary | ICD-10-CM

## 2024-07-17 DIAGNOSIS — M6281 Muscle weakness (generalized): Secondary | ICD-10-CM

## 2024-07-17 DIAGNOSIS — R2689 Other abnormalities of gait and mobility: Secondary | ICD-10-CM

## 2024-07-17 DIAGNOSIS — R41841 Cognitive communication deficit: Secondary | ICD-10-CM

## 2024-07-17 DIAGNOSIS — R262 Difficulty in walking, not elsewhere classified: Secondary | ICD-10-CM
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
## 2024-07-18 ENCOUNTER — Ambulatory Visit

## 2024-07-18 ENCOUNTER — Ambulatory Visit: Admitting: Speech Pathology

## 2024-07-22 ENCOUNTER — Ambulatory Visit: Admitting: Physical Therapy

## 2024-07-28 ENCOUNTER — Ambulatory Visit

## 2024-07-29 ENCOUNTER — Emergency Department
Admission: EM | Admit: 2024-07-29 | Discharge: 2024-07-30 | Disposition: A | Discharge status: Other Acute Inpatient Hospital | Attending: Emergency Medicine | Admitting: Emergency Medicine

## 2024-07-29 ENCOUNTER — Ambulatory Visit: Admitting: Physical Therapy

## 2024-07-29 ENCOUNTER — Ambulatory Visit: Admitting: Speech Pathology

## 2024-07-29 ENCOUNTER — Other Ambulatory Visit: Payer: Self-pay

## 2024-07-29 DIAGNOSIS — I10 Essential (primary) hypertension: Secondary | ICD-10-CM | POA: Insufficient documentation

## 2024-07-29 DIAGNOSIS — F3131 Bipolar disorder, current episode depressed, mild: Secondary | ICD-10-CM | POA: Insufficient documentation

## 2024-07-29 DIAGNOSIS — F17210 Nicotine dependence, cigarettes, uncomplicated: Secondary | ICD-10-CM | POA: Insufficient documentation

## 2024-07-29 DIAGNOSIS — F250 Schizoaffective disorder, bipolar type: Secondary | ICD-10-CM

## 2024-07-29 DIAGNOSIS — E119 Type 2 diabetes mellitus without complications: Secondary | ICD-10-CM | POA: Insufficient documentation

## 2024-07-29 DIAGNOSIS — Z794 Long term (current) use of insulin: Secondary | ICD-10-CM | POA: Insufficient documentation

## 2024-07-29 DIAGNOSIS — J449 Chronic obstructive pulmonary disease, unspecified: Secondary | ICD-10-CM | POA: Insufficient documentation

## 2024-07-29 DIAGNOSIS — Z79899 Other long term (current) drug therapy: Secondary | ICD-10-CM | POA: Insufficient documentation

## 2024-07-29 DIAGNOSIS — R4689 Other symptoms and signs involving appearance and behavior: Secondary | ICD-10-CM

## 2024-07-29 DIAGNOSIS — R41841 Cognitive communication deficit: Secondary | ICD-10-CM

## 2024-07-29 DIAGNOSIS — Z7982 Long term (current) use of aspirin: Secondary | ICD-10-CM | POA: Insufficient documentation

## 2024-07-29 LAB — ETHANOL: Alcohol, Ethyl (B): <15 mg/dL

## 2024-07-29 LAB — COMPREHENSIVE METABOLIC PANEL WITH GFR
ALT: 18 U/L (ref 0–44)
AST: 18 U/L (ref 15–41)
Albumin: 4.3 g/dL (ref 3.5–5.0)
Alkaline Phosphatase: 86 U/L (ref 38–126)
Anion gap: 13 (ref 5–15)
BUN: 23 mg/dL — ABNORMAL HIGH (ref 6–20)
CO2: 22 mmol/L (ref 22–32)
Calcium: 10 mg/dL (ref 8.9–10.3)
Chloride: 103 mmol/L (ref 98–111)
Creatinine, Ser: 1.41 mg/dL — ABNORMAL HIGH (ref 0.61–1.24)
GFR, Estimated: 57 mL/min — ABNORMAL LOW
Glucose, Bld: 87 mg/dL (ref 70–99)
Potassium: 4.1 mmol/L (ref 3.5–5.1)
Sodium: 138 mmol/L (ref 135–145)
Total Bilirubin: 0.2 mg/dL (ref 0.0–1.2)
Total Protein: 6.8 g/dL (ref 6.5–8.1)

## 2024-07-29 LAB — URINALYSIS, ROUTINE W REFLEX MICROSCOPIC
Bilirubin Urine: NEGATIVE
Glucose, UA: 150 mg/dL — AB
Hgb urine dipstick: NEGATIVE
Ketones, ur: NEGATIVE mg/dL
Leukocytes,Ua: NEGATIVE
Nitrite: NEGATIVE
Protein, ur: NEGATIVE mg/dL
Specific Gravity, Urine: 1.004 — ABNORMAL LOW (ref 1.005–1.030)
pH: 6 (ref 5.0–8.0)

## 2024-07-29 LAB — URINE DRUG SCREEN
Amphetamines: NEGATIVE
Barbiturates: NEGATIVE
Benzodiazepines: NEGATIVE
Cocaine: NEGATIVE
Fentanyl: NEGATIVE
Methadone Scn, Ur: NEGATIVE
Opiates: NEGATIVE
Tetrahydrocannabinol: POSITIVE — AB

## 2024-07-29 LAB — CBC
HCT: 45.6 % (ref 39.0–52.0)
Hemoglobin: 15.7 g/dL (ref 13.0–17.0)
MCH: 29.6 pg (ref 26.0–34.0)
MCHC: 34.4 g/dL (ref 30.0–36.0)
MCV: 85.9 fL (ref 80.0–100.0)
Platelets: 240 K/uL (ref 150–400)
RBC: 5.31 MIL/uL (ref 4.22–5.81)
RDW: 13 % (ref 11.5–15.5)
WBC: 15.2 K/uL — ABNORMAL HIGH (ref 4.0–10.5)
nRBC: 0 % (ref 0.0–0.2)

## 2024-07-29 MED ORDER — BENZTROPINE MESYLATE 1 MG PO TABS: 0.5000 mg | ORAL_TABLET | Freq: Every day | ORAL | Status: DC

## 2024-07-29 MED ORDER — PALIPERIDONE ER 3 MG PO TB24: 3.0000 mg | ORAL_TABLET | Freq: Every day | ORAL | Status: DC

## 2024-07-29 MED ORDER — OLANZAPINE 5 MG PO TABS: 5.0000 mg | ORAL_TABLET | Freq: Four times a day (QID) | ORAL | Status: DC | PRN

## 2024-07-29 MED ORDER — DIPHENHYDRAMINE HCL 50 MG/ML IJ SOLN: 50.0000 mg | Freq: Four times a day (QID) | INTRAMUSCULAR | Status: DC | PRN

## 2024-07-29 MED ORDER — TRAZODONE HCL 100 MG PO TABS
300.0000 mg | ORAL_TABLET | Freq: Once | ORAL | Status: AC
  Administered 2024-07-29: 300 mg via ORAL
  Filled 2024-07-29: qty 3

## 2024-07-29 MED ORDER — OLANZAPINE 10 MG IM SOLR: 5.0000 mg | Freq: Four times a day (QID) | INTRAMUSCULAR | Status: DC | PRN

## 2024-07-29 MED ORDER — DIPHENHYDRAMINE HCL 25 MG PO CAPS: 50.0000 mg | ORAL_CAPSULE | Freq: Four times a day (QID) | ORAL | Status: DC | PRN

## 2024-07-29 NOTE — ED Notes (Signed)
 Pt to nurses station asking for his belongings so he can leave. Pt states I don't need to be here. there is some confusion about my clothes but I want to go home. I am not a bad guy. Pt reassured by this nurse that nobody believes he is bad and that he will receive his personal belongings when he leaves the ED. Pt informed that a few more things were needed from him such as a urine sample and EKG. Pt verbalized understanding.

## 2024-07-29 NOTE — Consult Note (Signed)
 [facility] Consult Note  Patient Name: [name] MRN: [id number] DOB: 02/13/1966 DATE OF Consult: 07/29/2024 Consult Order details:  Orders (From admission, onward)     Start     Ordered   07/29/24 1532  CONSULT TO CALL ACT TEAM       Ordering Provider: Jacolyn Pae, MD  Provider:  (Not yet assigned)  Question:  Reason for Consult?  Answer:  Psych consult   07/29/24 1531   07/29/24 1532  IP CONSULT TO PSYCHIATRY       Ordering Provider: Jacolyn Pae, MD  Provider:  (Not yet assigned)  Question Answer Comment  Reason for consult: Other (see comments)   Comments: SI      07/29/24 1531            PRIMARY PSYCHIATRIC DIAGNOSES Schizoaffective disorder bipolar type current episode depressed; Rule out substance induced depressive disorder; Rule out delirium due to general medical condition   Based on my current evaluation and assessment of the patient, he is a 58 year old male with depressive decompensation in the context of psychotropic medication noncompliance. He is describing inability to care for his basic needs and suicidal plan to cut himself lethally. He was unable to contract for safety. The patient's presentation is consistent with Schizoaffective disorder bipolar type current episode depressed; Rule out substance induced depressive disorder; Rule out delirium due to general medical condition. Therefore, patient does meet criteria for an intensive inpatient psychiatric hospitalization.  RECOMMENDATIONS  Inpatient psychiatric admission recommended?   YES, patient is at high risk to self at this time. Requires involuntary admission if patient does not agree to voluntary psychiatric admission.   Medication recommendations:  Risks, benefits, side effects and alternatives to treatments reviewed:  -Restart paliperidone  3 mg, but will schedule at bedtime, for mood stabilization. Side effects include: Dizziness, lightheadedness, drowsiness, nausea,  vomiting, tiredness, excess saliva/drooling, blurred vision, weight gain, constipation, headache, restlessness (especially in the legs), shaking (tremor), muscle spasm, mask-like expression of the face, trouble controlling certain urges (such as gambling, sex, eating or shopping), unusual uncontrolled movements called tardive dyskinesia (these uncontrolled movements are often of the face, mouth, tongue, arms, or legs),  and trouble sleeping may occur. Note the following serious side effects:  fainting, suicidal thoughts, trouble swallowing, and seizures.  -Begin benztropine  0.5 mg at bedtime for extrapyramidal side effects secondary to antipsychotic use.   As needed medications to manage patient's acute symptoms while in hospital care:  -Maximize utilization of verbal de-escalation techniques, if attempts are unsuccessful and patient poses a threat to self and others: Consider olanzapine (Zyprexa) 2.5 mg to 5 mg PO/IM with diphenhydramine  25 mg to 50 mg PO/IM every 6 hours as needed for severe agitation. Would offer patient the option of taking PO medication first, but if patient refuses then may administer IM medication as a last resort. Would not exceed 20 mg of olanzapine within a 24-hour period. Avoid co-administering intramuscular olanzapine with intravenous benzodiazepine, as giving both medications concurrently is associated with respiratory depression.   Non-Medication recommendations:  -Please obtain EKG to guide psychotropic management. Note: Please stop all antipsychotic and QTc prolonging medications if patient's QTc is greater than 480 ms. Of note, to decrease the risk of prolonged QTc, please maintain potassium and magnesium  levels within normal ranges. -Agree with work up for organic causes of altered mentation and mood dysregulation, consider the following if not already performed and clinically appropriate: CT of the head, CBC and differential, basic metabolic profile, liver function  tests  (if abnormal consider ammonia level), urinalysis, urine toxicology screen, vitamin B12 level, vitamin D level, TSH with reflex free T4  Observation recommendations:  per unit protocol for monitoring suicidal patient   I personally spent a total of 45 minutes in the care of the patient today including preparing to see the patient, getting/reviewing separately obtained history, performing a medically appropriate exam/evaluation, counseling and educating, placing orders, referring and communicating with other health care professionals, documenting clinical information in the EHR, independently interpreting results, communicating results, and coordinating care.  Thank you for involving us  in the care of this patient. If you have any additional questions or concerns, please call [phone] and ask for me or the provider on-call.  TELEPSYCHIATRY ATTESTATION & CONSENT  As the provider for this telehealth consult, I attest that I verified the patients identity using two separate identifiers, introduced myself to the patient, provided my credentials, disclosed my location, and performed this encounter via a HIPAA-compliant, real-time, face-to-face, two-way, interactive audio and video platform and with the full consent and agreement of the patient (or guardian as applicable.)  Patient physical location: [facility] Emergency Department at [facility] . Telehealth provider physical location: home office in state of [place].  Video start time: 1915 (Central Time) Video end time: 1935 (Central Time)  IDENTIFYING DATA  [name] is a 58 y.o. year-old male for whom a psychiatric consultation has been ordered by the primary provider. The patient was identified using two separate identifiers.  CHIEF COMPLAINT/REASON FOR CONSULT  Behavioral health concerns   HISTORY OF PRESENT ILLNESS (HPI)  I evaluated the patient today face-to-face via secure, HIPAA-compliant telepsychiatric connection, and at the  request of the primary treatment team. The reason for the telepsychiatric consultation is that the patient is a 58 year old male with a documented history of chronic kidney disease stage 3a, bilateral tinnitus, hypertension, hyperlipidemia, schizoaffective disorder bipolar type who presents for psychiatric evaluation. Primary team is seeking psychotropic medication recommendations, safety evaluation to determine appropriateness for more intensive psychiatric services and diagnostic clarity as to the patient's presentation.   During one-on-one evaluation with this provider, patient was alert and oriented to self and to location and situation. The patient did not appear to be overtly inappropriately internally preoccupied; patient's thought process was linear and concrete. Patient related that he lives near his sister, and she visits about 2-3 times weekly. In fact, it was at her prompting that patient agreed to present to the ED as she is worried about patient. Patient reported that over the past 2 months he has discontinued his psychotropic medications, stopped engaging in his hobbies, and has been neglecting his self care (describes his home as becoming increasingly unkempt and admits that he is not attending his personal hygiene and eats very little). Patient admits that his sleep is very poor, only getting 2-4 hours a night. He has been having suicidal ideations with plan to kill himself via lethally cutting his wrists. Patient was unable to contract for safety; however, he is willing to engage in an intensive inpatient psychiatric admission.   PAST PSYCHIATRIC HISTORY  Inpatient psychiatric treatment: per patient, yes  Outpatient mental health treatment: per patient, he is established with a psychiatric provider   Current home psychotropic medications: per patient, denies medication compliance  Suicide attempts: per patient, yes  Trauma history: patient did not assert further concerns for abuse,  trauma, exploitation or neglect beyond described in the HPI Otherwise as per HPI above.  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 Verta)   S/P endoscopy 01/16/07   Capsule, bleeding in small bowel   Schizoaffective disorder      HOME MEDICATIONS  PTA Medications  Medication Sig   methocarbamol  (ROBAXIN ) 750 MG tablet Take 1 tablet (750 mg total) by mouth every 8 (eight) hours as needed for muscle spasms.   metFORMIN  (GLUCOPHAGE ) 1000 MG tablet Take 1 tablet (1,000 mg total) by mouth 2 (two) times daily with a meal.   gabapentin  (NEURONTIN ) 400 MG capsule Take 2 capsules (800 mg total) by mouth 3 (three) times daily.   trihexyphenidyl (ARTANE) 5 MG tablet Take 5 mg by mouth 3 (three) times daily with meals.   hydrOXYzine  (ATARAX /VISTARIL ) 25 MG tablet Take 25 mg by mouth 3 (three) times daily.   atorvastatin (LIPITOR) 20 MG tablet Take 20 mg by mouth at bedtime.   ASPIRIN LOW DOSE 81 MG tablet Take 81 mg by mouth daily.   LANTUS  SOLOSTAR 100 UNIT/ML Solostar Pen Inject 30-40 Units into the skin 2 (two) times daily. 30 units every morning and 40 units at bedtime   lithium  carbonate (ESKALITH ) 450 MG ER tablet Take 450 mg by mouth 2 (two) times daily.   trazodone  (DESYREL ) 300 MG tablet Take 300 mg by mouth at bedtime.   amLODipine  (NORVASC ) 5 MG tablet Take  1 tablet (5 mg total) by mouth daily.   lisinopril  (ZESTRIL ) 20 MG tablet Take 1 tablet (20 mg total) by mouth daily.   JARDIANCE 25 MG TABS tablet Take 25 mg by mouth daily.   paliperidone  (INVEGA ) 9 MG 24 hr tablet Take 9 mg by mouth at bedtime.   OZEMPIC, 1 MG/DOSE, 4 MG/3ML SOPN Inject 1 mg into the skin once a week.   paliperidone  (INVEGA ) 3 MG 24 hr tablet Take 3 mg by mouth at bedtime.   clonazePAM  (KLONOPIN ) 0.5 MG tablet Take 1 tablet (0.5 mg total) by mouth 3 (three) times daily as needed for anxiety.   insulin  detemir (LEVEMIR ) 100 UNIT/ML injection Inject 0.35 mLs (35 Units total) into the skin 2 (two) times daily. (Patient not taking: Reported on 07/29/2024)   Blood Glucose Monitoring Suppl (ACCU-CHEK GUIDE) w/Device KIT    OZEMPIC, 0.25 OR 0.5 MG/DOSE, 2 MG/3ML SOPN  (Patient not taking: Reported on 07/29/2024)   terbinafine (LAMISIL) 250 MG tablet Take 250 mg by mouth daily. (Patient not taking: Reported on 07/29/2024)   ACCU-CHEK GUIDE TEST test strip 1 each as needed.   OZEMPIC, 2 MG/DOSE, 8 MG/3ML SOPN Inject 2 mg into the skin once a week.   EMBECTA PEN NEEDLE ULTRAFINE 31G X 8 MM MISC  ALLERGIES  Allergies[1]  SOCIAL & SUBSTANCE USE HISTORY  Social History   Socioeconomic History   Marital status: Divorced    Spouse name: Not on file   Number of children: 1   Years of education: Not on file   Highest education level: Not on file  Occupational History   Occupation: Chartered Certified Accountant, Environmental Health Practitioner.    Employer: DISABLED   Occupation: Disability from back surgery  Tobacco Use   Smoking status: Some Days    Current packs/day: 0.50    Average packs/day: 0.5 packs/day for 14.0 years (7.0 ttl pk-yrs)    Types: Cigarettes   Smokeless tobacco: Never   Tobacco comments:    Trying to quit smoker  Vaping Use   Vaping status: Never Used  Substance and Sexual Activity   Alcohol use: Not Currently    Comment: Rarely, occasional beer   Drug use: No   Sexual activity:  Not on file  Other Topics Concern   Not on file  Social History Narrative   Not on file   Social Drivers of Health   Tobacco Use: High Risk (07/15/2024)   Patient History    Smoking Tobacco Use: Some Days    Smokeless Tobacco Use: Never    Passive Exposure: Not on file  Financial Resource Strain: Not on file  Food Insecurity: Not on file  Transportation Needs: Not on file  Physical Activity: Not on file  Stress: Not on file  Social Connections: Not on file  Depression (EYV7-0): Not on file  Alcohol Screen: Not on file  Housing: Not on file  Utilities: Not on file  Health Literacy: Not on file   Tobacco Use History[2] Social History   Substance and Sexual Activity  Alcohol Use Not Currently   Comment: Rarely, occasional beer   Social History   Substance and Sexual Activity  Drug Use No    Additional pertinent information THC per urine tox screen.  FAMILY HISTORY  Family History  Problem Relation Age of Onset   Cancer Mother        Breast, bone CA in pelvis and lower back   Depression Mother        mild paranoid schizophrenic   Diabetes Maternal Grandfather    COPD Maternal Grandfather     MENTAL STATUS EXAM (MSE)  Mental Status Exam: General Appearance: unkempt  Orientation:  Full (Time, Place, and Person)  Memory:  Immediate;   Fair Recent;   Fair Remote;   Fair  Concentration:  Concentration: Fair and Attention Span: Fair  Recall:  Fair  Attention  Fair  Eye Contact:  Fair  Speech:  Clear and Coherent  Language:  Good  Volume:  Decreased  Mood: not  too good  Affect:  Congruent  Thought Process:  Goal Directed  Thought Content:  Logical  Suicidal Thoughts:  Yes.  with intent/plan  Homicidal Thoughts:  No  Judgement:  Fair  Insight:  Fair  Psychomotor Activity:  Normal  Akathisia:  No  Fund of Knowledge:  Good    Assets:  Communication Skills Desire for Improvement Housing Social Support  Cognition:  WNL  ADL's:  Impaired  AIMS (if  indicated):       VITALS  Blood pressure 126/80, pulse 63, temperature 98 F (36.7 C), temperature source Oral, resp. rate 18, height 5' 9 (1.753 m), weight 114 kg, SpO2 94%.  LABS  Admission on 07/29/2024  Component Date Value Ref Range Status   Sodium 07/29/2024 138  135 -  145 mmol/L Final   Potassium 07/29/2024 4.1  3.5 - 5.1 mmol/L Final   Chloride 07/29/2024 103  98 - 111 mmol/L Final   CO2 07/29/2024 22  22 - 32 mmol/L Final   Glucose, Bld 07/29/2024 87  70 - 99 mg/dL Final   Glucose reference range applies only to samples taken after fasting for at least 8 hours.   BUN 07/29/2024 23 (H)  6 - 20 mg/dL Final   Creatinine, Ser 07/29/2024 1.41 (H)  0.61 - 1.24 mg/dL Final   Calcium 87/69/7974 10.0  8.9 - 10.3 mg/dL Final   Total Protein 87/69/7974 6.8  6.5 - 8.1 g/dL Final   Albumin 87/69/7974 4.3  3.5 - 5.0 g/dL Final   AST 87/69/7974 18  15 - 41 U/L Final   ALT 07/29/2024 18  0 - 44 U/L Final   Alkaline Phosphatase 07/29/2024 86  38 - 126 U/L Final   Total Bilirubin 07/29/2024 0.2  0.0 - 1.2 mg/dL Final   GFR, Estimated 07/29/2024 57 (L)  >60 mL/min Final   Comment: (NOTE) Calculated using the CKD-EPI Creatinine Equation (2021)    Anion gap 07/29/2024 13  5 - 15 Final   Performed at [facility], [address] [place], [place] [address]   Alcohol, Ethyl (B) 07/29/2024 <15  <15 mg/dL Final   Comment: (NOTE) For medical purposes only. Performed at [facility], [address] [place], [place] [address]    WBC 07/29/2024 15.2 (H)  4.0 - 10.5 K/uL Final   RBC 07/29/2024 5.31  4.22 - 5.81 MIL/uL Final   Hemoglobin 07/29/2024 15.7  13.0 - 17.0 g/dL Final   HCT 87/69/7974 45.6  39.0 - 52.0 % Final   MCV 07/29/2024 85.9  80.0 - 100.0 fL Final   MCH 07/29/2024 29.6  26.0 - 34.0 pg Final   MCHC 07/29/2024 34.4  30.0 - 36.0 g/dL Final   RDW 87/69/7974 13.0  11.5 - 15.5 % Final   Platelets 07/29/2024 240  150 - 400 K/uL Final   nRBC 07/29/2024 0.0  0.0 -  0.2 % Final   Performed at [facility], [address] [place], [place] [address]   Opiates 07/29/2024 NEGATIVE  NEGATIVE Final   Cocaine 07/29/2024 NEGATIVE  NEGATIVE Final   Benzodiazepines 07/29/2024 NEGATIVE  NEGATIVE Final   Amphetamines 07/29/2024 NEGATIVE  NEGATIVE Final   Tetrahydrocannabinol 07/29/2024 POSITIVE (A)  NEGATIVE Final   Barbiturates 07/29/2024 NEGATIVE  NEGATIVE Final   Methadone Scn, Ur 07/29/2024 NEGATIVE  NEGATIVE Final   Fentanyl 07/29/2024 NEGATIVE  NEGATIVE Final   Comment: (NOTE) Drug screen is for Medical Purposes only. Positive results are preliminary only. If confirmation is needed, notify lab within 5 days.  Drug Class                 Cutoff (ng/mL) Amphetamine and metabolites 1000 Barbiturate and metabolites 200 Benzodiazepine              200 Opiates and metabolites     300 Cocaine and metabolites     300 THC                         50 Fentanyl                    5 Methadone                   300  Trazodone  is metabolized in  vivo to several metabolites,  including pharmacologically active m-CPP, which is excreted in the  urine.  Immunoassay screens for amphetamines and MDMA have potential  cross-reactivity with these compounds and may provide false positive  result.  Performed at [facility], [address] [place], [place] [address]     PSYCHIATRIC REVIEW OF SYSTEMS (ROS)  ROS: Notable for the following relevant positive findings: Review of Systems  Psychiatric/Behavioral:  Positive for depression, substance abuse and suicidal ideas. Negative for hallucinations and memory loss. The patient is nervous/anxious and has insomnia.     Additional findings:      Musculoskeletal: No abnormal movements observed      Gait & Station: Laying/Sitting      Pain Screening: Present - mild to moderate      Nutrition & Dental Concerns: Decrease in food intake and/or loss of appetite  RISK FORMULATION/ASSESSMENT  Is the patient  experiencing any suicidal or homicidal ideations: Yes       Explain if yes: and suicidal plan to cut himself lethally Protective factors considered for safety management: Current care in a highly monitored health care setting  Risk factors/concerns considered for safety management:  Prior attempt Depression Substance abuse/dependence Physical illness/chronic pain Age over 77 Hopelessness Barriers to accessing treatment Male gender Unmarried  Is there a safety management plan with the patient and treatment team to minimize risk factors and promote protective factors: Yes           Explain: psychiatric hospitalization  Is crisis care placement or psychiatric hospitalization recommended: Yes     Based on my current evaluation and risk assessment, patient is determined at this time to be at:  High risk  *RISK ASSESSMENT Risk assessment is a dynamic process; it is possible that this patient's condition, and risk level, may change. This should be re-evaluated and managed over time as appropriate. Please re-consult psychiatric consult services if additional assistance is needed in terms of risk assessment and management. If your team decides to discharge this patient, please advise the patient how to best access emergency psychiatric services, or to call 911, if their condition worsens or they feel unsafe in any way.   Charlene Buba, MD Telepsychiatry Consult Services     [1]  Allergies Allergen Reactions   Divalproex Sodium     REACTION: unspecified reaction   Papaya Derivatives   [2]  Social History Tobacco Use  Smoking Status Some Days   Current packs/day: 0.50   Average packs/day: 0.5 packs/day for 14.0 years (7.0 ttl pk-yrs)   Types: Cigarettes  Smokeless Tobacco Never  Tobacco Comments   Trying to quit smoker

## 2024-07-29 NOTE — BH Assessment (Addendum)
 Psych consult has been placed with [facility] for patient to be seen.

## 2024-07-29 NOTE — ED Triage Notes (Signed)
 Pt was at speech therapy and answered questions indicating he is suicidal. Sister is with patient and says he has had a hard week. She reports he was having a crisis situation on Sunday where he was crying and stating that he is tired of living. EMS and police was at his home and he refused to be transported to the hospital.

## 2024-07-29 NOTE — ED Notes (Signed)
 TTS at the bedside for pt evaluation with [facility] ipad.
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
## 2024-07-29 NOTE — ED Notes (Addendum)
 Pt moved to BHU3 from ED and oriented to unit. Rules and policies explained. Pt verbalized understanding. Report received from previous RN.

## 2024-07-29 NOTE — ED Notes (Signed)
 Sister with pt, reports pt has not been eating or drinking and crying excessively this week. Number in chart if needed.

## 2024-07-29 NOTE — ED Provider Notes (Signed)
 "  [facility] Provider Note    Event Date/Time   First MD Initiated Contact with Patient 07/29/24 1503     (approximate)   History   Suicidal   HPI  [name] is a 58 y.o. male with a history of depression, bipolar disorder and schizoaffective disorder who presents with for mental health evaluation.  Per the triage note, the patient's sister came in with him and stated that he was having a crisis a few days ago where he was crying and stating that he is tired of living.  The patient endorses mood swings, which he will have periodically.  He states that he is not currently feeling suicidal although has been somewhat depressed.  He states every once a while he needs to voluntarily bring himself to the hospital to be treated.  He denies any acute medical complaints  I reviewed the past medical records.  The patient's most recent outpatient encounters were all with physical therapy and speech-language pathology at [facility].  He was seen in the ED on 5/14 reporting worsening depression symptoms and requesting referral for outpatient counseling.   Physical Exam   Triage Vital Signs: ED Triage Vitals  Encounter Vitals Group     BP 07/29/24 1416 126/80     Girls Systolic BP Percentile --      Girls Diastolic BP Percentile --      Boys Systolic BP Percentile --      Boys Diastolic BP Percentile --      Pulse Rate 07/29/24 1416 64     Resp 07/29/24 1416 19     Temp 07/29/24 1416 98.5 F (36.9 C)     Temp Source 07/29/24 1416 Oral     SpO2 07/29/24 1416 98 %     Weight 07/29/24 1413 251 lb 5.2 oz (114 kg)     Height 07/29/24 1413 5' 9 (1.753 m)     Head Circumference --      Peak Flow --      Pain Score 07/29/24 1413 0     Pain Loc --      Pain Education --      Exclude from Growth Chart --     Most recent vital signs: Vitals:   07/29/24 1416 07/29/24 2052  BP: 126/80   Pulse: 64 63  Resp: 19 18  Temp: 98.5 F (36.9 C) 98 F (36.7 C)   SpO2: 98% 94%    General: Awake, no distress.  CV:  Good peripheral perfusion.  Resp:  Normal effort.  Abd:  No distention.  Other:  Calm and cooperative.   ED Results / Procedures / Treatments   Labs (all labs ordered are listed, but only abnormal results are displayed) Labs Reviewed  COMPREHENSIVE METABOLIC PANEL WITH GFR - Abnormal; Notable for the following components:      Result Value   BUN 23 (*)    Creatinine, Ser 1.41 (*)    GFR, Estimated 57 (*)    All other components within normal limits  CBC - Abnormal; Notable for the following components:   WBC 15.2 (*)    All other components within normal limits  URINE DRUG SCREEN - Abnormal; Notable for the following components:   Tetrahydrocannabinol POSITIVE (*)    All other components within normal limits  URINALYSIS, ROUTINE W REFLEX MICROSCOPIC - Abnormal; Notable for the following components:   Color, Urine COLORLESS (*)    APPearance CLEAR (*)    Specific Gravity, Urine  1.004 (*)    Glucose, UA 150 (*)    All other components within normal limits  ETHANOL     EKG    RADIOLOGY    PROCEDURES:  Critical Care performed: No  Procedures   MEDICATIONS ORDERED IN ED: Medications  paliperidone  (INVEGA ) 24 hr tablet 3 mg (has no administration in time range)  benztropine  (COGENTIN ) tablet 0.5 mg (has no administration in time range)  OLANZapine (ZYPREXA) tablet 5 mg (has no administration in time range)    Or  OLANZapine (ZYPREXA) injection 5 mg (has no administration in time range)  diphenhydrAMINE  (BENADRYL ) capsule 50 mg (has no administration in time range)    Or  diphenhydrAMINE  (BENADRYL ) injection 50 mg (has no administration in time range)  traZODone  (DESYREL ) tablet 300 mg (300 mg Oral Given 07/29/24 2149)     IMPRESSION / MDM / ASSESSMENT AND PLAN / ED COURSE  I reviewed the triage vital signs and the nursing notes.  58 year old male with PMH as noted above presents with mood swings,  and SI previously which appears to have resolved.  Exam is unremarkable.  Differential diagnosis includes, but is not limited to, major depressive disorder, bipolar disorder, adjustment disorder, substance-induced mood disorder.  Patient's presentation is most consistent with exacerbation of chronic illness.  Lab workup was obtained for medical clearance and is unremarkable.  CMP shows no acute findings.  CBC does show leukocytosis which is nonspecific.  This appears to be chronic for the patient.  He has no signs or symptoms of acute infection.  Ethanol level is negative.  I have ordered psychiatry and TTS consults for further evaluation.  The patient has been placed in psychiatric observation due to the need to provide a safe environment for the patient while obtaining psychiatric consultation and evaluation, as well as ongoing medical and medication management to treat the patient's condition.  The patient has not been placed under full IVC at this time.   ----------------------------------------- 10:37 PM on 07/29/2024 -----------------------------------------  The patient has been evaluated by psychiatry.  The provider recommends inpatient admission.   FINAL CLINICAL IMPRESSION(S) / ED DIAGNOSES   Final diagnoses:  Behavior concern     Rx / DC Orders   ED Discharge Orders     None        Note:  This document was prepared using Dragon voice recognition software and may include unintentional dictation errors.    Jacolyn Pae, MD 07/29/24 2238  "

## 2024-07-29 NOTE — ED Notes (Signed)
 VOL/  PENDING  CONSULT

## 2024-07-30 ENCOUNTER — Inpatient Hospital Stay
Admission: AD | Admit: 2024-07-30 | Discharge: 2024-08-01 | DRG: 885 | Disposition: A | Discharge status: Home / Self Care | Source: Intra-hospital | Attending: Psychiatry | Admitting: Psychiatry

## 2024-07-30 ENCOUNTER — Other Ambulatory Visit: Payer: Self-pay

## 2024-07-30 ENCOUNTER — Encounter: Payer: Self-pay | Admitting: Psychiatry

## 2024-07-30 DIAGNOSIS — Z803 Family history of malignant neoplasm of breast: Secondary | ICD-10-CM

## 2024-07-30 DIAGNOSIS — F250 Schizoaffective disorder, bipolar type: Secondary | ICD-10-CM | POA: Diagnosis not present

## 2024-07-30 DIAGNOSIS — N1831 Chronic kidney disease, stage 3a: Secondary | ICD-10-CM | POA: Diagnosis present

## 2024-07-30 DIAGNOSIS — F419 Anxiety disorder, unspecified: Secondary | ICD-10-CM | POA: Diagnosis present

## 2024-07-30 DIAGNOSIS — F17210 Nicotine dependence, cigarettes, uncomplicated: Secondary | ICD-10-CM | POA: Diagnosis present

## 2024-07-30 DIAGNOSIS — Z825 Family history of asthma and other chronic lower respiratory diseases: Secondary | ICD-10-CM

## 2024-07-30 DIAGNOSIS — F1011 Alcohol abuse, in remission: Secondary | ICD-10-CM | POA: Diagnosis present

## 2024-07-30 DIAGNOSIS — F329 Major depressive disorder, single episode, unspecified: Secondary | ICD-10-CM | POA: Diagnosis not present

## 2024-07-30 DIAGNOSIS — E1122 Type 2 diabetes mellitus with diabetic chronic kidney disease: Secondary | ICD-10-CM | POA: Diagnosis present

## 2024-07-30 DIAGNOSIS — G4733 Obstructive sleep apnea (adult) (pediatric): Secondary | ICD-10-CM | POA: Diagnosis present

## 2024-07-30 DIAGNOSIS — I129 Hypertensive chronic kidney disease with stage 1 through stage 4 chronic kidney disease, or unspecified chronic kidney disease: Secondary | ICD-10-CM | POA: Diagnosis present

## 2024-07-30 DIAGNOSIS — G4700 Insomnia, unspecified: Secondary | ICD-10-CM | POA: Diagnosis present

## 2024-07-30 DIAGNOSIS — Z91148 Patient's other noncompliance with medication regimen for other reason: Secondary | ICD-10-CM | POA: Diagnosis not present

## 2024-07-30 DIAGNOSIS — Z818 Family history of other mental and behavioral disorders: Secondary | ICD-10-CM

## 2024-07-30 DIAGNOSIS — F251 Schizoaffective disorder, depressive type: Principal | ICD-10-CM | POA: Diagnosis present

## 2024-07-30 DIAGNOSIS — E785 Hyperlipidemia, unspecified: Secondary | ICD-10-CM | POA: Diagnosis present

## 2024-07-30 DIAGNOSIS — J449 Chronic obstructive pulmonary disease, unspecified: Secondary | ICD-10-CM | POA: Diagnosis present

## 2024-07-30 DIAGNOSIS — Z9151 Personal history of suicidal behavior: Secondary | ICD-10-CM | POA: Diagnosis not present

## 2024-07-30 DIAGNOSIS — F319 Bipolar disorder, unspecified: Secondary | ICD-10-CM | POA: Diagnosis not present

## 2024-07-30 DIAGNOSIS — R45851 Suicidal ideations: Secondary | ICD-10-CM | POA: Diagnosis present

## 2024-07-30 DIAGNOSIS — Z833 Family history of diabetes mellitus: Secondary | ICD-10-CM

## 2024-07-30 DIAGNOSIS — Z808 Family history of malignant neoplasm of other organs or systems: Secondary | ICD-10-CM | POA: Diagnosis not present

## 2024-07-30 LAB — RESP PANEL BY RT-PCR (RSV, FLU A&B, COVID)  RVPGX2
Influenza A by PCR: NEGATIVE
Influenza B by PCR: NEGATIVE
Resp Syncytial Virus by PCR: NEGATIVE
SARS Coronavirus 2 by RT PCR: NEGATIVE

## 2024-07-30 LAB — GLUCOSE, CAPILLARY: Glucose-Capillary: 123 mg/dL — ABNORMAL HIGH (ref 70–99)

## 2024-07-30 MED ORDER — ATORVASTATIN CALCIUM 10 MG PO TABS
20.0000 mg | ORAL_TABLET | Freq: Every day | ORAL | Status: DC
  Administered 2024-07-30 – 2024-07-31 (×2): 20 mg via ORAL
  Filled 2024-07-30 (×2): qty 2

## 2024-07-30 MED ORDER — PALIPERIDONE ER 3 MG PO TB24
6.0000 mg | ORAL_TABLET | Freq: Every day | ORAL | Status: DC
  Administered 2024-07-30 – 2024-07-31 (×2): 6 mg via ORAL
  Filled 2024-07-30 (×2): qty 2

## 2024-07-30 MED ORDER — OLANZAPINE 10 MG IM SOLR: 5.0000 mg | Freq: Three times a day (TID) | INTRAMUSCULAR | Status: DC | PRN

## 2024-07-30 MED ORDER — OLANZAPINE 5 MG PO TBDP: 5.0000 mg | ORAL_TABLET | Freq: Three times a day (TID) | ORAL | Status: DC | PRN

## 2024-07-30 MED ORDER — METHOCARBAMOL 500 MG PO TABS: 750.0000 mg | ORAL_TABLET | Freq: Three times a day (TID) | ORAL | Status: DC | PRN

## 2024-07-30 MED ORDER — TRAZODONE HCL 100 MG PO TABS
300.0000 mg | ORAL_TABLET | Freq: Every day | ORAL | Status: DC
  Administered 2024-07-30 – 2024-07-31 (×2): 300 mg via ORAL
  Filled 2024-07-30 (×2): qty 3

## 2024-07-30 MED ORDER — METFORMIN HCL 500 MG PO TABS
1000.0000 mg | ORAL_TABLET | Freq: Two times a day (BID) | ORAL | Status: DC
  Administered 2024-07-30 – 2024-08-01 (×4): 1000 mg via ORAL
  Filled 2024-07-30 (×5): qty 2

## 2024-07-30 MED ORDER — EMPAGLIFLOZIN 25 MG PO TABS
25.0000 mg | ORAL_TABLET | Freq: Every day | ORAL | Status: DC
  Administered 2024-07-31 – 2024-08-01 (×2): 25 mg via ORAL
  Filled 2024-07-30 (×2): qty 1

## 2024-07-30 MED ORDER — PALIPERIDONE ER 3 MG PO TB24: 3.0000 mg | ORAL_TABLET | Freq: Every day | ORAL | Status: DC

## 2024-07-30 MED ORDER — LISINOPRIL 20 MG PO TABS
20.0000 mg | ORAL_TABLET | Freq: Every day | ORAL | Status: DC
  Administered 2024-07-30 – 2024-08-01 (×3): 20 mg via ORAL
  Filled 2024-07-30 (×3): qty 1

## 2024-07-30 MED ORDER — CLONAZEPAM 0.5 MG PO TABS: 0.5000 mg | ORAL_TABLET | Freq: Three times a day (TID) | ORAL | Status: DC | PRN

## 2024-07-30 MED ORDER — AMLODIPINE BESYLATE 5 MG PO TABS
5.0000 mg | ORAL_TABLET | Freq: Every day | ORAL | Status: DC
  Administered 2024-07-30 – 2024-08-01 (×3): 5 mg via ORAL
  Filled 2024-07-30 (×3): qty 1

## 2024-07-30 MED ORDER — ASPIRIN 81 MG PO TBEC
81.0000 mg | DELAYED_RELEASE_TABLET | Freq: Every day | ORAL | Status: DC
  Administered 2024-07-30 – 2024-08-01 (×3): 81 mg via ORAL
  Filled 2024-07-30 (×3): qty 1

## 2024-07-30 MED ORDER — HYDROXYZINE HCL 25 MG PO TABS
25.0000 mg | ORAL_TABLET | Freq: Three times a day (TID) | ORAL | Status: DC
  Administered 2024-07-30 – 2024-08-01 (×6): 25 mg via ORAL
  Filled 2024-07-30 (×6): qty 1

## 2024-07-30 MED ORDER — LITHIUM CARBONATE ER 450 MG PO TBCR
450.0000 mg | EXTENDED_RELEASE_TABLET | Freq: Two times a day (BID) | ORAL | Status: DC
  Administered 2024-07-30 – 2024-08-01 (×4): 450 mg via ORAL
  Filled 2024-07-30 (×4): qty 1

## 2024-07-30 MED ORDER — GABAPENTIN 400 MG PO CAPS
800.0000 mg | ORAL_CAPSULE | Freq: Three times a day (TID) | ORAL | Status: DC
  Administered 2024-07-30 – 2024-08-01 (×6): 800 mg via ORAL
  Filled 2024-07-30 (×6): qty 2

## 2024-07-30 NOTE — Progress Notes (Signed)
 Behavior:   Irritable and wanting to leave.    Psych assessment:  Endorses anxiety and feeling stressed.  Denies SI/HI and AVH.   Interaction / Group attendance:  Present in the milieu for meals.   Minimal interaction with peers.  Demanding of staff.    Medication/ PRNs:  Compliant.  Pain: Denies.  15 min checks in place for safety.

## 2024-07-30 NOTE — Progress Notes (Signed)
" °   07/30/24 1330  Spiritual Encounters  Type of Visit Initial  Care provided to: Patient  Conversation partners present during encounter Other (comment) (peer)  Referral source Chaplain assessment  Reason for visit Routine spiritual support  OnCall Visit No  Interventions  Spiritual Care Interventions Made Established relationship of care and support   I sought to establish a relationship of care with [name]. I spoke with him in dayroom while also engaging a peer. Both were experiencing frustration. I sought to foster mutual empathy among peers and to also reaffirm my care for their needs.  [name] shared feeling that he wanted to leave. He was frustrated but also fairly flat in his affect. He did not engage this chaplain in more specific ways or to be receptive to engagement.  I invited him to attend group starting at 1400.  [name] L. Delores HERO.Div "

## 2024-07-30 NOTE — Plan of Care (Signed)
 Pt new to the unit haven't time to progress.   Problem: Education: Goal: Utilization of techniques to improve thought processes will improve Outcome: Not Progressing Goal: Knowledge of the prescribed therapeutic regimen will improve Outcome: Not Progressing   Problem: Activity: Goal: Interest or engagement in leisure activities will improve Outcome: Not Progressing Goal: Imbalance in normal sleep/wake cycle will improve Outcome: Not Progressing   Problem: Coping: Goal: Coping ability will improve Outcome: Not Progressing Goal: Will verbalize feelings Outcome: Not Progressing

## 2024-07-30 NOTE — Tx Team (Signed)
 Initial Treatment Plan 07/30/2024 6:09 AM [name] [name] [id number]    PATIENT STRESSORS: Financial difficulties   Traumatic event     PATIENT STRENGTHS: Ability for insight  Capable of independent living  Communication skills  Motivation for treatment/growth    PATIENT IDENTIFIED PROBLEMS: Depression  Anxiety  Suicidal ideation                 DISCHARGE CRITERIA:  Improved stabilization in mood, thinking, and/or behavior Verbal commitment to aftercare and medication compliance  PRELIMINARY DISCHARGE PLAN: Outpatient therapy Return to previous work or school arrangements  PATIENT/FAMILY INVOLVEMENT: This treatment plan has been presented to and reviewed with the patient, [name].  The patient and family have been given the opportunity to ask questions and make suggestions.  Baker Kerns, RN 07/30/2024, 6:09 AM

## 2024-07-30 NOTE — BHH Group Notes (Signed)
 Spirituality Group   Description: Participant directed exploration of values, beliefs and meaning   **Group focused on setting intentions for the year ahead: What do we wish to leave behind/what do we claim going forward   Following a brief framework of chaplains role and ground rules of group behavior, participants are invited to share concerns or questions that engage spiritual life. Emphasis placed on common themes and shared experiences and ways to make meaning and clarify living into ones values.   Theory/Process/Goal: Utilize the theoretical framework of group therapy established by Celena Kite, Relational Cultural Theory and Rogerian approaches to facilitate relational empathy and use of the here and now to foster reflection, self-awareness, and sharing.   Observations: Urho sat apart from group but remained present. Responded when prompted but did not really engage the group.  Abdirahman Chittum L. Delores HERO.Div

## 2024-07-30 NOTE — Group Note (Signed)
 Date:  07/30/2024 Time:  3:39 PM  Group Topic/Focus:  Coping With Mental Health Crisis:   The purpose of this group is to help patients identify strategies for coping with mental health crisis.  Group discusses possible causes of crisis and ways to manage them effectively.    Participation Level:  Did Not Attend  Norleen SHAUNNA Bias 07/30/2024, 3:39 PM

## 2024-07-30 NOTE — Progress Notes (Signed)
 Patient to nurse's station demanding his keys and wallet so he can leave.  Staff explained to pt he would have to speak with the doctor tomorrow.about discharge dates.  Pt accepting and returned to his room.
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
## 2024-07-30 NOTE — H&P (Signed)
 CHIEF COMPLAINT  My sister was worried about me. Jesus been having mood swings.  HISTORY OF PRESENT ILLNESS  Deondrea Markos is a 58 year old divorced male with a longstanding history of schizoaffective disorder, bipolar type, depression, multiple prior psychiatric hospitalizations, and prior suicide attempts, who presents for psychiatric evaluation due to worsening depression and suicidal ideation.  Per triage and collateral information from the patients sister, the patient experienced a mental health crisis several days prior to presentation characterized by crying spells and verbalizing that he was tired of living. The patient reports intermittent mood swings and acknowledges that he periodically brings himself to the hospital voluntarily when he recognizes psychiatric decompensation.  During todays evaluation, the patient endorses significant depressive symptoms, including poor sleep (2-4 hours nightly), poor appetite, decreased energy, anhedonia, social withdrawal, and impaired self-care. He reports that over the past approximately two months, he stopped taking his prescribed psychiatric medications, disengaged from hobbies, and neglected activities of daily living, describing his home environment as increasingly unkempt.  The patient endorses active suicidal ideation with intent and plan, specifically stating that he has considered killing himself by lethally cutting his wrists. He is unable to contract for safety at this time. He denies current homicidal ideation. He denies auditory or visual hallucinations and does not appear overtly internally preoccupied during the interview. Thought processes are linear and goal-directed.  The patient lives near his sister, who visits several times per week and prompted this ED presentation due to concern for his safety and functional decline. The patient is agreeable to inpatient psychiatric admission.  He denies acute medical  complaints.  PAST PSYCHIATRIC HISTORY  Diagnoses:  Schizoaffective disorder, bipolar type  Bipolar disorder  Major depressive disorder  Prior Psychiatric Hospitalizations: Yes (including [facility] admission for SI in 2013)  Outpatient Psychiatric Care: Established, nonadherent  Medication Compliance: Poor; stopped medications ~2 months ago  Suicide Attempts: Yes (including overdose history)  Self-Injurious Behavior: History of attempts  Trauma History: No additional trauma reported  PAST MEDICAL HISTORY  Chronic kidney disease stage 3a  Hypertension  Hyperlipidemia  Type II diabetes mellitus  COPD  Obstructive sleep apnea  Chronic pain / prior back surgery  History of lithium  toxicity  Alcohol use disorder (in remission)  SURGICAL HISTORY  Back surgery (per chart)  ALLERGIES  Divalproex sodium - unspecified reaction  Papaya derivatives  HOME MEDICATIONS (PRIOR TO ADMISSION)  Extensive medication list reviewed in chart, notable for:  Paliperidone   Lithium  carbonate  Trazodone   Clonazepam  PRN  Trihexyphenidyl  Gabapentin   Multiple antihypertensive, diabetic, and lipid-lowering agents  Patient reports nonadherence to psychiatric medications.  FAMILY HISTORY  Mother: Depression, paranoid schizophrenia  Maternal grandfather: Diabetes, COPD  Mother: Breast and bone cancer  SOCIAL HISTORY  Marital Status: Divorced  Children: One  Living Situation: Lives alone; close proximity to sister  Employment: Disabled; former chartered certified accountant  Tobacco: Some-day smoker (~0.5 PPD)  Alcohol: Not currently; rare use  Illicit Drugs: Denies; urine tox positive for THC  REVIEW OF SYSTEMS (PSYCHIATRIC)  Positive for: depression, suicidal ideation, anxiety, insomnia, decreased appetite Negative for hallucinations, paranoia, memory loss  MENTAL STATUS EXAM  Appearance: Unkempt  Behavior: Cooperative  Orientation: Alert and oriented to person,  place, time, and situation  Speech: Clear, coherent, decreased volume  Mood: Not too good  Affect: Congruent  Thought Process: Goal-directed  Thought Content: Logical  Suicidal Ideation: Present with intent and plan  Homicidal Ideation: Denied  Perceptions: No hallucinations  Insight: Fair  Judgment: Fair  Psychomotor Activity: Normal  Cognition: Grossly intact  ADLs: Impaired  VITAL SIGNS  BP 126/80  HR 63  RR 18  Temp 39F  SpO? 94% Height 59  Weight 114 kg  LABORATORY DATA (REVIEWED)  Creatinine 1.41 (elevated)  eGFR 57 mL/min (CKD 3a)  WBC 15.2 (elevated)  Urine toxicology: THC positive  No acute metabolic derangements  SUICIDE RISK ASSESSMENT  Current SI: Yes  Plan: Yes - wrist cutting  Intent: Yes  Past Attempts: Yes  Protective Factors: Currently in monitored medical setting; family involvement  Risk Factors:  Severe mood disorder  Medication nonadherence  Prior attempts  Substance use  Chronic medical illness  Male gender  Age >47  Social isolation  Overall Suicide Risk: HIGH  ASSESSMENT / DIAGNOSES  Schizoaffective disorder, bipolar type - acute decompensation  Major depressive episode with suicidal ideation  Medication nonadherence  Cannabis use  Multiple chronic medical comorbidities  PLAN  Disposition / Safety  Patient requires inpatient psychiatric hospitalization due to active suicidal ideation with intent and plan and inability to ensure safety  Suicide precautions per unit protocol  Medications  Restart paliperidone  3 mg PO at bedtime  Start benztropine  0.5 mg PO at bedtime for EPS prophylaxis  PRN agitation: olanzapine 2.5-5 mg PO/IM + diphenhydramine  per protocol  Monitoring / Workup  Obtain baseline EKG prior to antipsychotic titration  Hold QT-prolonging agents if QTc >480 ms  Maintain potassium and magnesium  within normal range  Continue medical evaluation as  indicated  Informed Consent  Risks, benefits, side effects, and alternatives to treatment discussed with patient; patient verbalizes understanding and agrees with plan

## 2024-07-30 NOTE — Group Note (Signed)
 Date:  07/30/2024 Time:  10:39 AM  Group Topic/Focus:    For geriatric patients, combining gentle, low-impact exercises like walking, chair yoga, or Tai Chi with mindfulness and meditation (breathing, body scans) significantly boosts physical (balance, strength) and mental (less anxiety/depression, better focus) health, improving overall well-being by managing chronic pain and enhancing emotional resilience. Adaptations are key: use chairs for stability, focus on simple guided sessions, and prioritize comfort for balance, mobility, and sensory issues.  Meditation is a simple yet powerful way to support your emotional and physical well-being. Regular practice can help lower blood pressure, ease anxiety, and improve focus and sleep. From deep breathing and body scans to guided imagery and mindfulness, there are many styles to explore.  Participation Level:  None  Participation Quality:  Inattentive  Affect:  Flat  Cognitive:  Alert  Insight: None  Engagement in Group:  None  Modes of Intervention:  Activity  Additional Comments:  N/A  Harlene LITTIE Gavel 07/30/2024, 10:39 AM

## 2024-07-30 NOTE — [facility] Suicide Risk Assessment (Signed)
 [facility] Admission Suicide Risk Assessment   Nursing information obtained from:    Demographic factors:  Male Current Mental Status:  Suicidal ideation indicated by patient Loss Factors:  Financial problems / change in socioeconomic status Historical Factors:  NA Risk Reduction Factors:  Positive social support  Total Time spent with patient: 45 minutes Principal Problem: Schizoaffective disorder, depressive type (HCC) Diagnosis:  Principal Problem:   Schizoaffective disorder, depressive type (HCC)  Subjective Data: [name] is a 58 year old divorced male with a longstanding history of schizoaffective disorder, bipolar type, depression, multiple prior psychiatric hospitalizations, and prior suicide attempts, who presents for psychiatric evaluation due to worsening depression and suicidal ideation.  Per triage and collateral information from the patients sister, the patient experienced a mental health crisis several days prior to presentation characterized by crying spells and verbalizing that he was tired of living. The patient reports intermittent mood swings and acknowledges that he periodically brings himself to the hospital voluntarily when he recognizes psychiatric decompensation.  During todays evaluation, the patient endorses significant depressive symptoms, including poor sleep (2-4 hours nightly), poor appetite, decreased energy, anhedonia, social withdrawal, and impaired self-care. He reports that over the past approximately two months, he stopped taking his prescribed psychiatric medications, disengaged from hobbies, and neglected activities of daily living, describing his home environment as increasingly unkempt.  The patient endorses active suicidal ideation with intent and plan, specifically stating that he has considered killing himself by lethally cutting his wrists. He is unable to contract for safety at this time. He denies current homicidal ideation. He denies  auditory or visual hallucinations and does not appear overtly internally preoccupied during the interview. Thought processes are linear and goal-directed.  The patient lives near his sister, who visits several times per week and prompted this ED presentation due to concern for his safety and functional decline. The patient is agreeable to inpatient psychiatric admission.  He denies acute medical complaints.  Continued Clinical Symptoms:  Alcohol Use Disorder Identification Test Final Score (AUDIT): 0 The Alcohol Use Disorders Identification Test, Guidelines for Use in Primary Care, Second Edition.  World Science Writer [facility]). Score between 0-7:  no or low risk or alcohol related problems. Score between 8-15:  moderate risk of alcohol related problems. Score between 16-19:  high risk of alcohol related problems. Score 20 or above:  warrants further diagnostic evaluation for alcohol dependence and treatment.   CLINICAL FACTORS:   Depression:   Impulsivity   Musculoskeletal: Strength & Muscle Tone: within normal limits Gait & Station: normal Patient leans: N/A  Psychiatric Specialty Exam:  Presentation  General Appearance: No data recorded Eye Contact:No data recorded Speech:No data recorded Speech Volume:No data recorded Handedness:No data recorded  Mood and Affect  Mood:No data recorded Affect:No data recorded  Thought Process  Thought Processes:No data recorded Descriptions of Associations:No data recorded Orientation:No data recorded Thought Content:No data recorded History of Schizophrenia/Schizoaffective disorder:No data recorded Duration of Psychotic Symptoms:No data recorded Hallucinations:No data recorded Ideas of Reference:No data recorded Suicidal Thoughts:No data recorded Homicidal Thoughts:No data recorded  Sensorium  Memory:No data recorded Judgment:No data recorded Insight:No data recorded  Executive Functions  Concentration:No data  recorded Attention Span:No data recorded Recall:No data recorded Fund of Knowledge:No data recorded Language:No data recorded  Psychomotor Activity  Psychomotor Activity:No data recorded  Assets  Assets:No data recorded  Sleep  Sleep:No data recorded Appearance: Unkempt  Behavior: Cooperative  Orientation: Alert and oriented to person, place, time, and situation  Speech: Clear, coherent, decreased volume  Mood: Not too good  Affect: Congruent  Thought Process: Goal-directed  Thought Content: Logical  Suicidal Ideation: Present with intent and plan  Homicidal Ideation: Denied  Perceptions: No hallucinations  Insight: Fair  Judgment: Fair  Psychomotor Activity: Normal  Cognition: Grossly intact  ADLs: Impaired  Physical Exam: Physical Exam ROS Blood pressure 122/84, pulse 71, temperature 97.7 F (36.5 C), resp. rate 19, height 5' 9 (1.753 m), weight 102.1 kg, SpO2 100%. Body mass index is 33.23 kg/m.   COGNITIVE FEATURES THAT CONTRIBUTE TO RISK:  Closed-mindedness    SUICIDE RISK:   Moderate:  Frequent suicidal ideation with limited intensity, and duration, some specificity in terms of plans, no associated intent, good self-control, limited dysphoria/symptomatology, some risk factors present, and identifiable protective factors, including available and accessible social support. Disposition / Safety  Patient requires inpatient psychiatric hospitalization due to active suicidal ideation with intent and plan and inability to ensure safety  Suicide precautions per unit protocol  Medications  Restart paliperidone  3 mg PO at bedtime  Start benztropine  0.5 mg PO at bedtime for EPS prophylaxis  PRN agitation: olanzapine 2.5-5 mg PO/IM + diphenhydramine  per protocol  Monitoring / Workup  Obtain baseline EKG prior to antipsychotic titration  Hold QT-prolonging agents if QTc >480 ms  Maintain potassium and magnesium  within normal range  Continue  medical evaluation as indicated  Informed Consent  Risks, benefits, side effects, and alternatives to treatment discussed with patient; patient verbalizes understanding and agrees with plan PLAN OF CARE:    I certify that inpatient services furnished can reasonably be expected to improve the patient's condition.   Millie JONELLE Manners, MD 07/30/2024, 3:14 PM

## 2024-07-30 NOTE — Plan of Care (Signed)
" °  Problem: Coping: Goal: Ability to demonstrate self-control will improve Outcome: Progressing   Problem: Education: Goal: Verbalization of understanding the information provided will improve Outcome: Not Progressing   Problem: Activity: Goal: Interest or engagement in activities will improve Outcome: Not Progressing   "

## 2024-07-30 NOTE — BH IP Treatment Plan (Signed)
 Interdisciplinary Treatment and Diagnostic Plan Update  07/30/2024 Time of Session: 11:06 AM  [name] MRN: [id number]  Principal Diagnosis: Schizoaffective disorder, depressive type (HCC)  Secondary Diagnoses: Principal Problem:   Schizoaffective disorder, depressive type (HCC)   Current Medications:  Current Facility-Administered Medications  Medication Dose Route Frequency Provider Last Rate Last Admin   OLANZapine (ZYPREXA) injection 5 mg  5 mg Intramuscular TID PRN Onuoha, Chinwendu V, NP       OLANZapine zydis (ZYPREXA) disintegrating tablet 5 mg  5 mg Oral TID PRN Onuoha, Chinwendu V, NP       PTA Medications: Medications Prior to Admission  Medication Sig Dispense Refill Last Dose/Taking   ACCU-CHEK GUIDE TEST test strip 1 each as needed.      amLODipine  (NORVASC ) 5 MG tablet Take 1 tablet (5 mg total) by mouth daily. 90 tablet 3    ASPIRIN LOW DOSE 81 MG tablet Take 81 mg by mouth daily.      atorvastatin (LIPITOR) 20 MG tablet Take 20 mg by mouth at bedtime.      Blood Glucose Monitoring Suppl (ACCU-CHEK GUIDE) w/Device KIT       clonazePAM  (KLONOPIN ) 0.5 MG tablet Take 1 tablet (0.5 mg total) by mouth 3 (three) times daily as needed for anxiety. 45 tablet 0    EMBECTA PEN NEEDLE ULTRAFINE 31G X 8 MM MISC       gabapentin  (NEURONTIN ) 400 MG capsule Take 2 capsules (800 mg total) by mouth 3 (three) times daily. 180 capsule 0    hydrOXYzine  (ATARAX /VISTARIL ) 25 MG tablet Take 25 mg by mouth 3 (three) times daily.      insulin  detemir (LEVEMIR ) 100 UNIT/ML injection Inject 0.35 mLs (35 Units total) into the skin 2 (two) times daily. (Patient not taking: Reported on 07/29/2024) 21 mL 0    JARDIANCE 25 MG TABS tablet Take 25 mg by mouth daily.      LANTUS  SOLOSTAR 100 UNIT/ML Solostar Pen Inject 30-40 Units into the skin 2 (two) times daily. 30 units every morning and 40 units at bedtime      lisinopril  (ZESTRIL ) 20 MG tablet Take 1 tablet (20 mg total) by mouth  daily. 90 tablet 3    lithium  carbonate (ESKALITH ) 450 MG ER tablet Take 450 mg by mouth 2 (two) times daily.      metFORMIN  (GLUCOPHAGE ) 1000 MG tablet Take 1 tablet (1,000 mg total) by mouth 2 (two) times daily with a meal. 60 tablet 0    methocarbamol  (ROBAXIN ) 750 MG tablet Take 1 tablet (750 mg total) by mouth every 8 (eight) hours as needed for muscle spasms. 45 tablet 0    OZEMPIC, 0.25 OR 0.5 MG/DOSE, 2 MG/3ML SOPN  (Patient not taking: Reported on 07/29/2024)      OZEMPIC, 1 MG/DOSE, 4 MG/3ML SOPN Inject 1 mg into the skin once a week.      OZEMPIC, 2 MG/DOSE, 8 MG/3ML SOPN Inject 2 mg into the skin once a week.      paliperidone  (INVEGA ) 3 MG 24 hr tablet Take 3 mg by mouth at bedtime.      paliperidone  (INVEGA ) 9 MG 24 hr tablet Take 9 mg by mouth at bedtime.      terbinafine (LAMISIL) 250 MG tablet Take 250 mg by mouth daily. (Patient not taking: Reported on 07/29/2024)      trazodone  (DESYREL ) 300 MG tablet Take 300 mg by mouth at bedtime.      trihexyphenidyl (ARTANE) 5 MG tablet Take  5 mg by mouth 3 (three) times daily with meals.       Patient Stressors: Financial difficulties   Traumatic event    Patient Strengths: Ability for insight  Capable of independent living  Manufacturing systems engineer  Motivation for treatment/growth   Treatment Modalities: Medication Management, Group therapy, Case management,  1 to 1 session with clinician, Psychoeducation, Recreational therapy.   Physician Treatment Plan for Primary Diagnosis: Schizoaffective disorder, depressive type (HCC) Long Term Goal(s):     Short Term Goals:    Medication Management: Evaluate patient's response, side effects, and tolerance of medication regimen.  Therapeutic Interventions: 1 to 1 sessions, Unit Group sessions and Medication administration.  Evaluation of Outcomes: Not Progressing  Physician Treatment Plan for Secondary Diagnosis: Principal Problem:   Schizoaffective disorder, depressive type  (HCC)  Long Term Goal(s):     Short Term Goals:       Medication Management: Evaluate patient's response, side effects, and tolerance of medication regimen.  Therapeutic Interventions: 1 to 1 sessions, Unit Group sessions and Medication administration.  Evaluation of Outcomes: Not Progressing   RN Treatment Plan for Primary Diagnosis: Schizoaffective disorder, depressive type (HCC) Long Term Goal(s): Knowledge of disease and therapeutic regimen to maintain health will improve  Short Term Goals: Ability to remain free from injury will improve, Ability to verbalize frustration and anger appropriately will improve, Ability to demonstrate self-control, Ability to participate in decision making will improve, Ability to verbalize feelings will improve, Ability to disclose and discuss suicidal ideas, Ability to identify and develop effective coping behaviors will improve, and Compliance with prescribed medications will improve  Medication Management: RN will administer medications as ordered by provider, will assess and evaluate patient's response and provide education to patient for prescribed medication. RN will report any adverse and/or side effects to prescribing provider.  Therapeutic Interventions: 1 on 1 counseling sessions, Psychoeducation, Medication administration, Evaluate responses to treatment, Monitor vital signs and CBGs as ordered, Perform/monitor CIWA, COWS, AIMS and Fall Risk screenings as ordered, Perform wound care treatments as ordered.  Evaluation of Outcomes: Not Progressing   LCSW Treatment Plan for Primary Diagnosis: Schizoaffective disorder, depressive type (HCC) Long Term Goal(s): Safe transition to appropriate next level of care at discharge, Engage patient in therapeutic group addressing interpersonal concerns.  Short Term Goals: Engage patient in aftercare planning with referrals and resources, Increase social support, Increase ability to appropriately verbalize  feelings, Increase emotional regulation, Facilitate acceptance of mental health diagnosis and concerns, Facilitate patient progression through stages of change regarding substance use diagnoses and concerns, Identify triggers associated with mental health/substance abuse issues, and Increase skills for wellness and recovery  Therapeutic Interventions: Assess for all discharge needs, 1 to 1 time with Social worker, Explore available resources and support systems, Assess for adequacy in community support network, Educate family and significant other(s) on suicide prevention, Complete Psychosocial Assessment, Interpersonal group therapy.  Evaluation of Outcomes: Not Progressing   Progress in Treatment: Attending groups: Yes. and No. Participating in groups: Yes. and No. Taking medication as prescribed: Yes. Toleration medication: Yes. Family/Significant other contact made: No, will contact:  CSW will contact if given permission  Patient understands diagnosis: Yes. Discussing patient identified problems/goals with staff: Yes. Medical problems stabilized or resolved: Yes. Denies suicidal/homicidal ideation: Yes. Issues/concerns per patient self-inventory: No. Other: None   New problem(s) identified: No, Describe:  None identified   New Short Term/Long Term Goal(s):  elimination of symptoms of psychosis, medication management for mood stabilization; elimination of SI thoughts; development  of comprehensive mental wellness plan.   Patient Goals:  I need to touch base, I was upset, I needed to get out of my environment. I would like to do better with stress  Discharge Plan or Barriers: CSW will assist with appropriate discharge planning   Reason for Continuation of Hospitalization: Depression Medication stabilization  Estimated Length of Stay: 1 to 7 days  Last 3 [place] Suicide Severity Risk Score: Flowsheet Row Admission (Current) from 07/30/2024 in [facility] ED  from 07/29/2024 in [facility] Emergency Department at [facility] ED from 12/12/2023 in [facility] Department at [facility]  C-SSRS RISK CATEGORY Low Risk Moderate Risk No Risk    Last PHQ 2/9 Scores:     No data to display          Scribe for Treatment Team: Lum [name], LCSWA 07/30/2024 11:17 AM

## 2024-07-30 NOTE — ED Provider Notes (Signed)
----------------------------------------- °  12:14 AM on 07/30/2024 -----------------------------------------  ED ECG REPORT I, Darleene Dome, the attending physician, personally viewed and interpreted this ECG.  Date: 07/30/2024 EKG Time: 00: 06 Rate: 58 Rhythm: mild sinus bradycardia QRS Axis: normal Intervals: normal ST/T Wave abnormalities: normal Narrative Interpretation: no evidence of acute ischemia    Dome Darleene, MD 07/30/24 [phone]

## 2024-07-31 DIAGNOSIS — Z9151 Personal history of suicidal behavior: Secondary | ICD-10-CM | POA: Diagnosis not present

## 2024-07-31 DIAGNOSIS — F250 Schizoaffective disorder, bipolar type: Secondary | ICD-10-CM | POA: Diagnosis not present

## 2024-07-31 DIAGNOSIS — F329 Major depressive disorder, single episode, unspecified: Secondary | ICD-10-CM

## 2024-07-31 NOTE — Plan of Care (Signed)
  Problem: Activity: Goal: Interest or engagement in activities will improve Outcome: Progressing Goal: Sleeping patterns will improve Outcome: Progressing   Problem: Coping: Goal: Ability to verbalize frustrations and anger appropriately will improve Outcome: Progressing Goal: Ability to demonstrate self-control will improve Outcome: Progressing   Problem: Safety: Goal: Periods of time without injury will increase Outcome: Progressing

## 2024-07-31 NOTE — Group Note (Signed)
 Date:  07/31/2024 Time:  11:05 AM  Group Topic/Focus:  Making Healthy Choices:   The focus of this group is to help patients identify negative/unhealthy choices they were using prior to admission and identify positive/healthier coping strategies to replace them upon discharge.    Participation Level:  Minimal  Participation Quality:  Appropriate and Attentive  Affect:  Appropriate  Cognitive:  Alert  Insight: Appropriate  Engagement in Group:  Engaged  Modes of Intervention:  Activity  Additional Comments:     Maglione,Thurza Kwiecinski E 07/31/2024, 11:05 AM

## 2024-07-31 NOTE — Group Note (Signed)
 Date:  07/31/2024 Time:  8:32 PM  Group Topic/Focus:  Wrap-Up Group:   The focus of this group is to help patients review their daily goal of treatment and discuss progress on daily workbooks.    Participation Level:  Did Not Attend [name] 07/31/2024, 8:32 PM

## 2024-07-31 NOTE — Progress Notes (Signed)
 Pt received in bed with eyes closed. RR even and unlabored. Pt did not attend group. Po med compliant. No PRNs given. No behavior issues noted. No c/o pain/discomfort noted. Pt slept throughout the night. Slept 10.75 hours   07/30/24 2100  Psych Admission Type (Psych Patients Only)  Admission Status Voluntary  Psychosocial Assessment  Patient Complaints None  Eye Contact Fair  Facial Expression Other (Comment);Flat  Affect UTA (alseep)  Speech Soft  Interaction Other (Comment) (alseep)  Motor Activity Slow  Appearance/Hygiene Unremarkable  Behavior Characteristics Other (Comment) (asleep)  Mood Other (Comment) (asleep)  Thought Process  Coherency WDL  Content WDL  Delusions None reported or observed  Perception WDL  Hallucination None reported or observed  Judgment Impaired  Confusion Mild  Danger to Self  Current suicidal ideation? Denies

## 2024-07-31 NOTE — Plan of Care (Signed)
   Problem: Education: Goal: Emotional status will improve Outcome: Not Progressing Goal: Mental status will improve Outcome: Not Progressing Goal: Verbalization of understanding the information provided will improve Outcome: Not Progressing

## 2024-07-31 NOTE — Group Note (Signed)
 Recreation Therapy Group Note   Group Topic:Goal Setting  Group Date: 07/31/2024 Start Time: 1330 End Time: 1405 Facilitators: Celestia Jeoffrey BRAVO, LRT, CTRS Location: Dayroom  Group Description: New Years Resolutions. Patients received a handout for them to write down their new years resolutions on it. The handout had a decorative border around the paper that patients could also color once they're finished writing their new year resolutions down. Afterwards, LRT encouraged patients to read out loud their new years resolutions to the group.   Goal Area(s) Addressed: Patient will increase communication,  Patient will identify a goal leading into the new year.  Patient will foster mutual support. Patient will express themselves through art.    Affect/Mood: N/A   Participation Level: Did not attend    Clinical Observations/Individualized Feedback: Patient did not attend.  Plan: Continue to engage patient in RT group sessions 2-3x/week.   Jeoffrey BRAVO Celestia, LRT, CTRS 07/31/2024 4:25 PM

## 2024-07-31 NOTE — Group Note (Signed)
 Date:  07/31/2024 Time:  3:43 PM  Group Topic/Focus:  Goals Group:   The focus of this group is to help patients establish daily goals to achieve during treatment and discuss how the patient can incorporate goal setting into their daily lives to aide in recovery.    Participation Level:  Did Not Attend   [name] 07/31/2024, 3:43 PM

## 2024-07-31 NOTE — Group Note (Signed)
 [facility] LCSW Group Therapy Note   Group Date: 07/31/2024 Start Time: 1300 End Time: 1330  Type of Therapy and Topic:  Group Therapy:  Feelings around Relapse and Recovery  Participation Level:  Did Not Attend   Description of Group:    Patients in this group will discuss emotions they experience before and after a relapse. They will process how experiencing these feelings, or avoidance of experiencing them, relates to having a relapse. Facilitator will guide patients to explore emotions they have related to recovery. Patients will be encouraged to process which emotions are more powerful. They will be guided to discuss the emotional reaction significant others in their lives may have to patients relapse or recovery. Patients will be assisted in exploring ways to respond to the emotions of others without this contributing to a relapse.  Therapeutic Goals: Patient will identify two or more emotions that lead to relapse for them:  Patient will identify two emotions that result when they relapse:  Patient will identify two emotions related to recovery:  Patient will demonstrate ability to communicate their needs through discussion and/or role plays.   Summary of Patient Progress: Patient did not attend group.   Therapeutic Modalities:   Cognitive Behavioral Therapy Solution-Focused Therapy Assertiveness Training Relapse Prevention Therapy   Nadara JONELLE Fam, LCSW

## 2024-07-31 NOTE — Progress Notes (Signed)
" °   07/31/24 2200  Psych Admission Type (Psych Patients Only)  Admission Status Voluntary/72 hour document signed  Date 72 hour document signed  07/31/24  Time 72 hour document signed  1223  Provider Notified (First and Last Name) (see details for LINK to note) Ruther Fass MD  Psychosocial Assessment  Patient Complaints Anxiety  Eye Contact Fair  Facial Expression Flat  Affect Appropriate to circumstance  Speech Soft  Interaction Minimal  Motor Activity Slow  Appearance/Hygiene Unremarkable  Behavior Characteristics Cooperative;Appropriate to situation  Mood Preoccupied  Thought Process  Coherency WDL  Content WDL  Delusions None reported or observed  Perception WDL  Hallucination None reported or observed  Judgment WDL  Confusion None  Danger to Self  Current suicidal ideation? Denies  Agreement Not to Harm Self Yes  Description of Agreement Verbal  Danger to Others  Danger to Others None reported or observed    "

## 2024-07-31 NOTE — Progress Notes (Signed)
" °   07/31/24 0830  Psych Admission Type (Psych Patients Only)  Admission Status Voluntary  Psychosocial Assessment  Patient Complaints Anxiety  Eye Contact Fair  Facial Expression Flat  Affect Appropriate to circumstance  Speech Unremarkable  Interaction Assertive  Motor Activity Slow  Appearance/Hygiene Unremarkable  Behavior Characteristics Cooperative;Appropriate to situation  Mood Other (Comment)  Thought Process  Coherency WDL  Content WDL  Delusions None reported or observed  Perception WDL  Hallucination None reported or observed  Judgment WDL  Confusion None  Danger to Self  Current suicidal ideation? Denies  Agreement Not to Harm Self Yes  Description of Agreement verbal   Pt did not interact in group or with others in the day room except during meal time. 72 voluntary form signed. MD aware.  Deztinee Lohmeyer S., RN "
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
## 2024-08-01 ENCOUNTER — Ambulatory Visit

## 2024-08-01 ENCOUNTER — Ambulatory Visit: Admitting: Speech Pathology

## 2024-08-01 DIAGNOSIS — F251 Schizoaffective disorder, depressive type: Secondary | ICD-10-CM

## 2024-08-01 MED ORDER — ASPIRIN 81 MG PO TBEC: 81.0000 mg | DELAYED_RELEASE_TABLET | Freq: Every day | ORAL | 12 refills | Status: AC

## 2024-08-01 NOTE — Discharge Summary (Signed)
 " Physician Discharge Summary Note  Patient:  [name] is an 59 y.o., male MRN:  [id number] DOB:  10-15-1965 Patient phone:  [phone] (home)  Patient address:   [address] [place] [address],  Total Time spent with patient: 45 minutes  Date of Admission:  07/30/2024 Date of Discharge: 1.2.2026  Reason for Admission: Suicidal thoughts  Principal Problem: Schizoaffective disorder, depressive type 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 Verta)   S/P endoscopy 01/16/07   Capsule, bleeding in small bowel   Schizoaffective disorder     Past Surgical History:  Procedure Laterality Date   BACK SURGERY  05/28/07   L4-S1 fusing, pins, screws and rods (Dr. Lucilla)   COLONOSCOPY WITH PROPOFOL  N/A 12/29/2020   Procedure: COLONOSCOPY WITH PROPOFOL ;  Surgeon: Janalyn Keene NOVAK, MD;  Location: [facility] ENDOSCOPY;  Service: Endoscopy;  Laterality: N/A;   DOPPLER ECHOCARDIOGRAPHY  10/20/04   Normal   Family History:  Family History  Problem Relation Age of Onset   Cancer Mother        Breast, bone CA in pelvis and lower back   Depression Mother        mild paranoid schizophrenic    Diabetes Maternal Grandfather    COPD Maternal Grandfather    Family Psychiatric  History: NONE Social History:  Social History   Substance and Sexual Activity  Alcohol Use Not Currently   Comment: Rarely, occasional beer     Social History   Substance and Sexual Activity  Drug Use No    Social History   Socioeconomic History   Marital status: Divorced    Spouse name: Not on file   Number of children: 1   Years of education: Not on file   Highest education level: Not on file  Occupational History   Occupation: Chartered Certified Accountant, Environmental Health Practitioner.    Employer: DISABLED   Occupation: Disability from back surgery  Tobacco Use   Smoking status: Some Days    Current packs/day: 0.50    Average packs/day: 0.5 packs/day for 14.0 years (7.0 ttl pk-yrs)    Types: Cigarettes   Smokeless tobacco: Never   Tobacco comments:    Trying to quit smoker  Vaping Use   Vaping status: Never Used  Substance and Sexual Activity   Alcohol use: Not Currently    Comment: Rarely, occasional beer   Drug use: No   Sexual activity: Not on file  Other Topics Concern   Not on file  Social History Narrative   Not on file  Social Drivers of Health   Tobacco Use: High Risk (07/30/2024)   Patient History    Smoking Tobacco Use: Some Days    Smokeless Tobacco Use: Never    Passive Exposure: Not on file  Financial Resource Strain: Not on file  Food Insecurity: No Food Insecurity (07/30/2024)   Epic    Worried About Programme Researcher, Broadcasting/film/video in the Last Year: Never true    Ran Out of Food in the Last Year: Never true  Transportation Needs: No Transportation Needs (07/30/2024)   Epic    Lack of Transportation (Medical): No    Lack of Transportation (Non-Medical): No  Physical Activity: Not on file  Stress: Not on file  Social Connections: Socially Isolated (07/30/2024)   Social Connection and Isolation Panel    Frequency of Communication with Friends and Family: More than three times a week     Frequency of Social Gatherings with Friends and Family: Three times a week    Attends Religious Services: Never    Active Member of Clubs or Organizations: No    Attends Banker Meetings: Never    Marital Status: Never married  Depression (PHQ2-9): Not on file  Alcohol Screen: Low Risk (07/30/2024)   Alcohol Screen    Last Alcohol Screening Score (AUDIT): 0  Housing: Low Risk (07/30/2024)   Epic    Unable to Pay for Housing in the Last Year: No    Number of Times Moved in the Last Year: 0    Homeless in the Last Year: No  Utilities: Not At Risk (07/30/2024)   Epic    Threatened with loss of utilities: No  Health Literacy: Not on file    Hospital Course: Admitted with the suicidal thoughts crying behaviors and increased perceptual disturbances he was restarted on his home medications Invega  like to switch to Invega  3 mg in the morning and 6 mg at night he desponded well to treatment and was discharged to outpatient follow-up  Physical Findings: AIMS:  , ,  ,  ,  ,  ,   CIWA:    COWS:     Musculoskeletal: Strength & Muscle Tone: within normal limits Gait & Station: normal Patient leans: N/A   Psychiatric Specialty Exam:     Sleep  Sleep:No data recorded Estimated Sleeping Duration (Last 24 Hours): 10.00-11.75 hours   Physical Exam: Physical Exam ROS Blood pressure 117/82, pulse 79, temperature (!) 97.3 F (36.3 C), resp. rate 16, height 5' 9 (1.753 m), weight 102.1 kg, SpO2 99%. Body mass index is 33.23 kg/m.   Tobacco Use History[1] Tobacco Cessation:  N/A, patient does not currently use tobacco products   Blood Alcohol level:  Lab Results  Component Value Date   [facility] <15 07/29/2024   ETH <15 12/12/2023    Metabolic Disorder Labs:  Lab Results  Component Value Date   HGBA1C 5.4 12/29/2016   MPG 108 12/29/2016   MPG 186 05/28/2007   Lab Results  Component Value Date   PROLACTIN 21.1 (H) 12/30/2016   Lab Results  Component Value Date    CHOL 148 12/30/2016   TRIG 174 (H) 12/30/2016   HDL 42 12/30/2016   CHOLHDL 3.5 12/30/2016   VLDL 35 12/30/2016   LDLCALC 71 12/30/2016   LDLCALC SEE COMMENT 01/09/2012    See Psychiatric Specialty Exam and Suicide Risk Assessment completed by Attending Physician prior to discharge.  Discharge destination:  Home  Is patient on multiple antipsychotic therapies at discharge:  No  Has Patient had three or more failed trials of antipsychotic monotherapy by history:  No  Recommended Plan for Multiple Antipsychotic Therapies: NA   Allergies as of 08/01/2024       Reactions   Divalproex Sodium    REACTION: unspecified reaction   Papaya Derivatives         Medication List     STOP taking these medications    Accu-Chek Guide Test test strip Generic drug: glucose blood   Accu-Chek Guide w/Device Kit   clonazePAM  0.5 MG tablet Commonly known as: KLONOPIN    Embecta Pen Needle Ultrafine 31G X 8 MM Misc Generic drug: Insulin  Pen Needle   hydrOXYzine  25 MG tablet Commonly known as: ATARAX    insulin  detemir 100 UNIT/ML injection Commonly known as: LEVEMIR    Ozempic (0.25 or 0.5 MG/DOSE) 2 MG/3ML Sopn Generic drug: Semaglutide(0.25 or 0.5MG /DOS)   Ozempic (1 MG/DOSE) 4 MG/3ML Sopn Generic drug: Semaglutide (1 MG/DOSE)   Ozempic (2 MG/DOSE) 8 MG/3ML Sopn Generic drug: Semaglutide (2 MG/DOSE)   terbinafine 250 MG tablet Commonly known as: LAMISIL       TAKE these medications      Indication  trihexyphenidyl 5 MG tablet Commonly known as: ARTANE Take 5 mg by mouth 3 (three) times daily with meals. The timing of this medication is very important.    amLODipine  5 MG tablet Commonly known as: NORVASC  Take 1 tablet (5 mg total) by mouth daily.  Indication: High Blood Pressure   aspirin  EC 81 MG tablet Commonly known as: Aspirin  Low Dose Take 1 tablet (81 mg total) by mouth daily. Swallow whole. Start taking on: August 02, 2024 What changed: additional  instructions    atorvastatin  20 MG tablet Commonly known as: LIPITOR Take 20 mg by mouth at bedtime.    gabapentin  400 MG capsule Commonly known as: NEURONTIN  Take 2 capsules (800 mg total) by mouth 3 (three) times daily.  Indication: pain   Jardiance  25 MG Tabs tablet Generic drug: empagliflozin  Take 25 mg by mouth daily.    Lantus  SoloStar 100 UNIT/ML Solostar Pen Generic drug: insulin  glargine Inject 30-40 Units into the skin 2 (two) times daily. 30 units every morning and 40 units at bedtime    lisinopril  20 MG tablet Commonly known as: ZESTRIL  Take 1 tablet (20 mg total) by mouth daily.    lithium  carbonate 450 MG ER tablet Commonly known as: ESKALITH  Take 450 mg by mouth 2 (two) times daily.    metFORMIN  1000 MG tablet Commonly known as: GLUCOPHAGE  Take 1 tablet (1,000 mg total) by mouth 2 (two) times daily with a meal.  Indication: Type 2 Diabetes   methocarbamol  750 MG tablet Commonly known as: ROBAXIN  Take 1 tablet (750 mg total) by mouth every 8 (eight) hours as needed for muscle spasms.  Indication: Musculoskeletal Pain   paliperidone  9 MG 24 hr tablet Commonly known as: INVEGA  Take 9 mg by mouth at bedtime. What changed: Another medication with the same name was removed. Continue taking this medication, and follow the directions you see here.    trazodone  300 MG tablet Commonly known as: DESYREL  Take 300 mg by mouth at bedtime.          Follow-up recommendations:  Activity:  NORMAL  Comments:  NONE  Signed: Sabriyah Wilcher R Shamon Cothran, MD 08/01/2024, 10:41 AM           [1]  Social History Tobacco Use  Smoking Status Some Days   Current packs/day: 0.50   Average packs/day: 0.5  packs/day for 14.0 years (7.0 ttl pk-yrs)   Types: Cigarettes  Smokeless Tobacco Never  Tobacco Comments   Trying to quit smoker   "

## 2024-08-01 NOTE — Progress Notes (Signed)
" °   07/31/24 2200  Psych Admission Type (Psych Patients Only)  Admission Status Voluntary/72 hour document signed  Date 72 hour document signed  07/31/24  Time 72 hour document signed  1223  Provider Notified (First and Last Name) (see details for LINK to note) Ruther Fass MD  Psychosocial Assessment  Patient Complaints Anxiety;Irritability  Eye Contact Fair  Facial Expression Flat  Affect Appropriate to circumstance  Speech Soft  Interaction Minimal  Motor Activity Slow  Appearance/Hygiene Unremarkable  Behavior Characteristics Cooperative;Appropriate to situation  Mood Preoccupied;Irritable  Thought Process  Coherency WDL  Content WDL  Delusions None reported or observed  Perception WDL  Hallucination None reported or observed  Judgment WDL  Confusion None  Danger to Self  Current suicidal ideation? Denies  Agreement Not to Harm Self Yes  Description of Agreement Verbal  Danger to Others  Danger to Others None reported or observed    "

## 2024-08-01 NOTE — Progress Notes (Signed)
 Patient discharged from [facility] unit escorted by staff. Patient denies SI/HI/AVH. Discharge packet to include printed AVS, Suicide Risk Assessment, and Transition Record reviewed with patient. Belongings to include wallet, eyeglasses (on person), and cell phone returned. Patient declined to complete a Suicide Safety Plan or patient survey.

## 2024-08-01 NOTE — [facility] Suicide Risk Assessment (Signed)
 [facility] Discharge Suicide Risk Assessment   Principal Problem: Schizoaffective disorder, depressive type [facility]) Discharge Diagnoses: Principal Problem:   Schizoaffective disorder, depressive type (HCC)   Total Time spent with patient: 45 minutes  Musculoskeletal: Strength & Muscle Tone: within normal limits Gait & Station: normal Patient leans: N/A  Psychiatric Specialty Exam  59 year old male who appears stated age alert oriented x 3 denies any suicidal thoughts no perceptual disturbances Insight and judgment limited  Assets  Assets:No data recorded  Sleep  Sleep:No data recorded Estimated Sleeping Duration (Last 24 Hours): 10.00-11.75 hours  Physical Exam: Physical Exam ROS Blood pressure 117/82, pulse 79, temperature (!) 97.3 F (36.3 C), resp. rate 16, height 5' 9 (1.753 m), weight 102.1 kg, SpO2 99%. Body mass index is 33.23 kg/m.  Mental Status Per Nursing Assessment::   On Admission:  Suicidal ideation indicated by patient  Demographic Factors:  Male  Loss Factors: Financial problems/change in socioeconomic status  Historical Factors: Impulsivity  Risk Reduction Factors:   Positive social support  Continued Clinical Symptoms:  Schizophrenia:   Paranoid or undifferentiated type  Cognitive Features That Contribute To Risk:  None    Suicide Risk:  Mild:  Suicidal ideation of limited frequency, intensity, duration, and specificity.  There are no identifiable plans, no associated intent, mild dysphoria and related symptoms, good self-control (both objective and subjective assessment), few other risk factors, and identifiable protective factors, including available and accessible social support.    Plan Of Care/Follow-up recommendations:  Activity:  fair  Millie JONELLE Manners, MD 08/01/2024, 10:39 AM

## 2024-08-01 NOTE — Care Management Important Message (Signed)
 Important Message  Patient Details  Name: [name] MRN: [id number] Date of Birth: 1965-08-27   Important Message Given:  Yes - Medicare IM     Lum [name], LCSWA 08/01/2024, 11:25 AM

## 2024-08-01 NOTE — [facility] Suicide Risk Assessment (Signed)
 [facility] INPATIENT:  Family/Significant Other Suicide Prevention Education  Suicide Prevention Education:  Education Completed; [name], sister, [phone],  (name of family member/significant other) has been identified by the patient as the family member/significant other with whom the patient will be residing, and identified as the person(s) who will aid the patient in the event of a mental health crisis (suicidal ideations/suicide attempt).  With written consent from the patient, the family member/significant other has been provided the following suicide prevention education, prior to the and/or following the discharge of the patient.  The suicide prevention education provided includes the following: Suicide risk factors Suicide prevention and interventions National Suicide Hotline telephone number [facility] assessment telephone number [facility] Emergency Assistance 911 [facility] and/or Residential Mobile Crisis Unit telephone number  Request made of family/significant other to: Remove weapons (e.g., guns, rifles, knives), all items previously/currently identified as safety concern.   Remove drugs/medications (over-the-counter, prescriptions, illicit drugs), all items previously/currently identified as a safety concern.  The family member/significant other verbalizes understanding of the suicide prevention education information provided.  The family member/significant other agrees to remove the items of safety concern listed above.  [name] 08/01/2024, 11:21 AM

## 2024-08-01 NOTE — Progress Notes (Signed)
 Primary Diagnosis:  Schizoaffective Disorder, Bipolar Type  Major Depressive Episode (improving)  History of Suicidal Behavior  Reason for Continued Hospitalization / Medical Necessity  Patient was admitted for acute suicidal ideation with intent and plan in the context of medication nonadherence, worsening depression, functional decline, and inability to contract for safety. Continued inpatient treatment remains medically necessary for stabilization, medication optimization, monitoring of suicide risk following recent active SI, and discharge planning given recent reversal of suicidal statements and signed 72-hour notice.  Interval History  The patient reports that his initial presentation was driven by frequent crying spells and emotional distress, which prompted him to seek care. He now denies any current suicidal ideation, intent, or plan. He reports improvement in mood and emotional regulation since admission and states that crying spells have resolved. He expresses a desire for discharge and has signed a 72-hour notice.  He denies auditory or visual hallucinations and has not exhibited overt psychotic symptoms during this hospitalization. Staff observations corroborate absence of crying spells, behavioral dysregulation, or internal preoccupation. The patient has been intermittently anxious and mildly irritable but overall cooperative and engaged with care. He has been compliant with medications.  Mental Status Examination  Appearance: Appropriate, fair hygiene  Behavior: Calm, cooperative  Speech: Normal rate, tone, and volume  Mood: Better  Affect: Constricted but congruent  Thought Process: Linear, logical, goal-directed  Thought Content:  Denies suicidal ideation, intent, or plan  Denies homicidal ideation  No delusions elicited  Perception: Denies auditory or visual hallucinations  Cognition: Alert and oriented 3  Insight: Partial  Judgment:  Improving  Suicide Risk Assessment  Recent Risk Factors:  Recent active suicidal ideation with plan  History of prior suicide attempts  Medication nonadherence  Mood disorder with psychotic features  Current Protective Factors:  Denies current SI  Engaged in treatment  Family involvement (sister)  Willingness to seek help  Current Risk Level: Moderate, improving Patient is not able to fully demonstrate sustained safety outside a controlled environment yet, given recent high-risk presentation and rapid change in reported SI.  Medications  Paliperidone  (INVEGA ):  3 mg PO every morning  6 mg PO every night Patient tolerating regimen without reported side effects.  Vitals / Medical Issues  Vitals stable per nursing documentation  No acute medical complaints today  Assessment  Mr. Noone is a 59 year old male with schizoaffective disorder and a significant suicide history who initially presented with severe depressive symptoms and active suicidal ideation with plan after prolonged medication nonadherence. Since admission, he demonstrates clinical improvement with resolution of crying spells and denial of current suicidal thoughts. However, given the acuity of his initial presentation, history of suicide attempts, and recent signing of a 72-hour notice, continued short-term inpatient monitoring remains clinically indicated to ensure sustained stability and safe discharge planning.  Plan  Safety  Continue inpatient level of care  Maintain close suicide risk monitoring  Reassess safety daily, especially in context of 72-hour notice  Medications  Continue INVEGA  3 mg PO qAM and 6 mg PO qHS  Monitor for efficacy and side effects  Psychiatric Care  Supportive psychotherapy  Encourage participation in milieu and groups  Discharge Planning  Possible discharge in 1-2 days if stability maintained  Coordinate with sister for collateral and aftercare  support  Ensure outpatient psychiatry follow-up and medication adherence plan  Communication  Treatment plan reviewed with patient  Risks, benefits, and alternatives discussed  Patient verbalizes understanding  Current Facility-Administered Medications  Medication Dose Route Frequency Provider Last Rate  Last Admin   amLODipine  (NORVASC ) tablet 5 mg  5 mg Oral Daily Cherita Hebel R, MD   5 mg at 07/31/24 1010   aspirin EC tablet 81 mg  81 mg Oral Daily Evrett Hakim R, MD   81 mg at 07/31/24 1010   atorvastatin (LIPITOR) tablet 20 mg  20 mg Oral QHS Rjay Revolorio R, MD   20 mg at 07/31/24 2053   clonazePAM  (KLONOPIN ) tablet 0.5 mg  0.5 mg Oral TID PRN Caeson Filippi R, MD       empagliflozin (JARDIANCE) tablet 25 mg  25 mg Oral Daily Kris No R, MD   25 mg at 07/31/24 1009   gabapentin  (NEURONTIN ) capsule 800 mg  800 mg Oral TID Kylin Genna R, MD   800 mg at 07/31/24 2054   hydrOXYzine  (ATARAX ) tablet 25 mg  25 mg Oral TID Kennadie Brenner R, MD   25 mg at 07/31/24 2053   lisinopril  (ZESTRIL ) tablet 20 mg  20 mg Oral Daily Raphaella Larkin R, MD   20 mg at 07/31/24 1010   lithium  carbonate (ESKALITH ) ER tablet 450 mg  450 mg Oral BID Izac Faulkenberry R, MD   450 mg at 07/31/24 2054   metFORMIN  (GLUCOPHAGE ) tablet 1,000 mg  1,000 mg Oral BID WC Britain Saber R, MD   1,000 mg at 08/01/24 0749   methocarbamol  (ROBAXIN ) tablet 750 mg  750 mg Oral Q8H PRN Kammie Scioli R, MD       OLANZapine (ZYPREXA) injection 5 mg  5 mg Intramuscular TID PRN Onuoha, Chinwendu V, NP       OLANZapine zydis (ZYPREXA) disintegrating tablet 5 mg  5 mg Oral TID PRN Onuoha, Chinwendu V, NP       paliperidone  (INVEGA ) 24 hr tablet 6 mg  6 mg Oral QHS Daine Gunther R, MD   6 mg at 07/31/24 2053   traZODone  (DESYREL ) tablet 300 mg  300 mg Oral QHS Atom Solivan R, MD   300 mg at 07/31/24 2054   Blood pressure 117/82, pulse 79, temperature (!) 97.3 F (36.3 C), resp. rate 16, height 5' 9  (1.753 m), weight 102.1 kg, SpO2 99%.

## 2024-08-01 NOTE — Progress Notes (Signed)
(  Sleep Hours) - 11.25  (Any PRNs that were needed, meds refused, or side effects to meds)- n/a  (Any disturbances and when (visitation, over night)- n/a  (Concerns raised by the patient)- n/a  (SI/HI/AVH)- denies

## 2024-08-01 NOTE — Plan of Care (Signed)
  Problem: Education: Goal: Knowledge of [facility] Education information/materials will improve Outcome: Adequate for Discharge Goal: Emotional status will improve Outcome: Adequate for Discharge Goal: Mental status will improve Outcome: Adequate for Discharge Goal: Verbalization of understanding the information provided will improve Outcome: Adequate for Discharge   Problem: Activity: Goal: Interest or engagement in activities will improve Outcome: Adequate for Discharge Goal: Sleeping patterns will improve Outcome: Adequate for Discharge   Problem: Coping: Goal: Ability to verbalize frustrations and anger appropriately will improve Outcome: Adequate for Discharge Goal: Ability to demonstrate self-control will improve Outcome: Adequate for Discharge   Problem: Health Behavior/Discharge Planning: Goal: Identification of resources available to assist in meeting health care needs will improve Outcome: Adequate for Discharge Goal: Compliance with treatment plan for underlying cause of condition will improve Outcome: Adequate for Discharge   Problem: Physical Regulation: Goal: Ability to maintain clinical measurements within normal limits will improve Outcome: Adequate for Discharge   Problem: Safety: Goal: Periods of time without injury will increase Outcome: Adequate for Discharge   Problem: Education: Goal: Ability to state activities that reduce stress will improve Outcome: Adequate for Discharge   Problem: Coping: Goal: Ability to identify and develop effective coping behavior will improve Outcome: Adequate for Discharge   Problem: Self-Concept: Goal: Ability to identify factors that promote anxiety will improve Outcome: Adequate for Discharge Goal: Level of anxiety will decrease Outcome: Adequate for Discharge Goal: Ability to modify response to factors that promote anxiety will improve Outcome: Adequate for Discharge   Problem: Education: Goal:  Utilization of techniques to improve thought processes will improve Outcome: Adequate for Discharge Goal: Knowledge of the prescribed therapeutic regimen will improve Outcome: Adequate for Discharge   Problem: Activity: Goal: Interest or engagement in leisure activities will improve Outcome: Adequate for Discharge Goal: Imbalance in normal sleep/wake cycle will improve Outcome: Adequate for Discharge   Problem: Coping: Goal: Coping ability will improve Outcome: Adequate for Discharge Goal: Will verbalize feelings Outcome: Adequate for Discharge   Problem: Health Behavior/Discharge Planning: Goal: Ability to make decisions will improve Outcome: Adequate for Discharge Goal: Compliance with therapeutic regimen will improve Outcome: Adequate for Discharge   Problem: Role Relationship: Goal: Will demonstrate positive changes in social behaviors and relationships Outcome: Adequate for Discharge   Problem: Safety: Goal: Ability to disclose and discuss suicidal ideas will improve Outcome: Adequate for Discharge Goal: Ability to identify and utilize support systems that promote safety will improve Outcome: Adequate for Discharge   Problem: Self-Concept: Goal: Will verbalize positive feelings about self Outcome: Adequate for Discharge Goal: Level of anxiety will decrease Outcome: Adequate for Discharge   

## 2024-08-01 NOTE — Progress Notes (Signed)
" °  [facility] Adult Case Management Discharge Plan :  Will you be returning to the same living situation after discharge:  Yes,  pt will return home  At discharge, do you have transportation home?: Yes,  CSW will assist with transportation  Do you have the ability to pay for your medications: Yes,  [employer] MEDICARE / [employer] MEDICARE HMO  Release of information consent forms completed and in the chart;  Patient's signature needed at discharge.  Patient to Follow up at:  Follow-up Information     [facility] on 09/02/2024.   Why: Your appointment is scheduled for at 3:20 PM VIRTUALLY. Contact information: [address] [place] [address] [phone]                 Next level of care provider has access to [facility] Link:no  Safety Planning and Suicide Prevention discussed: Yes,  SPE gone over with pt      Has patient been referred to the Quitline?: Yes, faxed/e-referral on 08/02/2023  Patient has been referred for addiction treatment: No known substance use disorder.  [address], LCSWA 08/01/2024, 11:17 AM "

## 2024-08-01 NOTE — Group Note (Signed)
 Date:  08/01/2024 Time:  12:14 PM  Group Topic/Focus:  Managing Feelings:   The focus of this group is to identify what feelings patients have difficulty handling and develop a plan to handle them in a healthier way upon discharge.    Participation Level:  Did Not Attend   Larrie Leita BRAVO 08/01/2024, 12:14 PM

## 2024-08-05 ENCOUNTER — Ambulatory Visit: Admitting: Physical Therapy

## 2024-08-08 ENCOUNTER — Ambulatory Visit

## 2024-08-12 ENCOUNTER — Ambulatory Visit

## 2024-08-15 ENCOUNTER — Ambulatory Visit

## 2024-08-19 ENCOUNTER — Ambulatory Visit

## 2024-08-22 ENCOUNTER — Ambulatory Visit

## 2024-08-26 ENCOUNTER — Ambulatory Visit

## 2024-08-29 ENCOUNTER — Ambulatory Visit

## 2024-09-02 ENCOUNTER — Ambulatory Visit

## 2024-09-04 ENCOUNTER — Ambulatory Visit

## 2024-09-05 ENCOUNTER — Ambulatory Visit

## 2024-09-09 ENCOUNTER — Ambulatory Visit

## 2024-09-12 ENCOUNTER — Ambulatory Visit
